# Patient Record
Sex: Female | Born: 1935 | Race: White | Hispanic: No | Smoking: Former smoker
Health system: Southern US, Community
[De-identification: ages and names within clinical notes are randomized; demographics above are authoritative.]

## PROBLEM LIST (undated history)

## (undated) DIAGNOSIS — E559 Vitamin D deficiency, unspecified: Secondary | ICD-10-CM

## (undated) DIAGNOSIS — M8580 Other specified disorders of bone density and structure, unspecified site: Secondary | ICD-10-CM

## (undated) DIAGNOSIS — E785 Hyperlipidemia, unspecified: Secondary | ICD-10-CM

## (undated) DIAGNOSIS — I8390 Asymptomatic varicose veins of unspecified lower extremity: Secondary | ICD-10-CM

## (undated) DIAGNOSIS — I10 Essential (primary) hypertension: Secondary | ICD-10-CM

---

## 1988-06-01 HISTORY — PX: ABDOMINAL HYSTERECTOMY: SHX81

## 2013-01-06 ENCOUNTER — Other Ambulatory Visit: Payer: Self-pay | Admitting: Internal Medicine

## 2013-01-06 DIAGNOSIS — I872 Venous insufficiency (chronic) (peripheral): Secondary | ICD-10-CM

## 2013-01-11 ENCOUNTER — Ambulatory Visit
Admission: RE | Admit: 2013-01-11 | Discharge: 2013-01-11 | Disposition: A | Discharge status: Home / Self Care | Payer: Medicare Other | Source: Ambulatory Visit | Attending: Internal Medicine | Admitting: Internal Medicine

## 2013-01-11 DIAGNOSIS — I872 Venous insufficiency (chronic) (peripheral): Secondary | ICD-10-CM

## 2013-01-11 HISTORY — DX: Essential (primary) hypertension: I10

## 2013-01-11 HISTORY — DX: Vitamin D deficiency, unspecified: E55.9

## 2013-01-11 HISTORY — DX: Hyperlipidemia, unspecified: E78.5

## 2013-01-11 HISTORY — DX: Other specified disorders of bone density and structure, unspecified site: M85.80

## 2013-01-11 HISTORY — DX: Asymptomatic varicose veins of unspecified lower extremity: I83.90

## 2013-06-21 ENCOUNTER — Telehealth: Payer: Self-pay | Admitting: Radiology

## 2013-06-21 NOTE — Telephone Encounter (Signed)
Patient plans to schedule f/u app't with Dr Delena Bali before making a decision to follow up here for re-evaluation and/or possible Rx.  States that she will contact our office should she decide to follow up with Dr Annamaria Boots.  Alexandra Kim [name], RN 06/21/2013 11:20 AM

## 2013-06-26 ENCOUNTER — Other Ambulatory Visit (HOSPITAL_COMMUNITY): Payer: Self-pay | Admitting: Interventional Radiology

## 2013-06-26 DIAGNOSIS — I872 Venous insufficiency (chronic) (peripheral): Secondary | ICD-10-CM

## 2013-07-25 ENCOUNTER — Inpatient Hospital Stay: Admission: RE | Admit: 2013-07-25 | Payer: Medicare Other | Source: Ambulatory Visit

## 2013-08-23 ENCOUNTER — Ambulatory Visit
Admission: RE | Admit: 2013-08-23 | Discharge: 2013-08-23 | Disposition: A | Discharge status: Home / Self Care | Payer: Medicare Other | Source: Ambulatory Visit | Attending: Interventional Radiology | Admitting: Interventional Radiology

## 2013-08-23 DIAGNOSIS — I872 Venous insufficiency (chronic) (peripheral): Secondary | ICD-10-CM

## 2013-08-23 NOTE — Progress Notes (Signed)
Daily use of graduated compression garment, 20-30 mm Hg for pain & edema, LLE> RLE.  Skin color changes of feet and ankles (sock pattern)  Restless leg syndrome affecting sleep.    LLE edema interferes with ADL's requiring frequent rest intervals & elevation.  Prefers not to take medications however occasionally takes Ibuprofen.   Aalaysia Liggins George, RN 08/23/2013 3:24 PM

## 2013-09-12 ENCOUNTER — Other Ambulatory Visit (HOSPITAL_COMMUNITY): Payer: Self-pay | Admitting: Interventional Radiology

## 2013-09-12 DIAGNOSIS — I83812 Varicose veins of left lower extremities with pain: Secondary | ICD-10-CM

## 2013-09-27 ENCOUNTER — Other Ambulatory Visit (HOSPITAL_COMMUNITY): Payer: Self-pay | Admitting: Interventional Radiology

## 2013-09-27 ENCOUNTER — Encounter (INDEPENDENT_AMBULATORY_CARE_PROVIDER_SITE_OTHER): Payer: Self-pay

## 2013-09-27 ENCOUNTER — Ambulatory Visit
Admission: RE | Admit: 2013-09-27 | Discharge: 2013-09-27 | Disposition: A | Discharge status: Home / Self Care | Payer: Medicare Other | Source: Ambulatory Visit | Attending: Interventional Radiology | Admitting: Interventional Radiology

## 2013-09-27 VITALS — BP 134/72 | HR 71 | Temp 97.4°F | Resp 17

## 2013-09-27 DIAGNOSIS — I83812 Varicose veins of left lower extremities with pain: Secondary | ICD-10-CM

## 2013-09-27 NOTE — Progress Notes (Signed)
0930  Informed Consent obtained.  Patient given verbal and written discharge instructions and states that she understands.  0945  22 gauge x 1" insyte catheter started IV Right antecubital (x 1 attempt).  Flushed with 10 ml NSS without difficulty.  Site unremarkable.  1015  Procedure Started.  1130  Procedure ended.  Patient tolerated well.  IV dc'd, catheter intact. Site unremarkable.  Wearing thigh high graduated compression garment (20-30 mm Hg) on LLE.  1140  Ambulated x 10 minutes without assistance, gait steady.  Tolerated well  1150 Discharged to home.  Granddaughter available to drive.    Yesika Rispoli [name], RN 09/27/2013 12:03 PM

## 2013-10-04 ENCOUNTER — Ambulatory Visit
Admission: RE | Admit: 2013-10-04 | Discharge: 2013-10-04 | Disposition: A | Discharge status: Home / Self Care | Payer: Medicare Other | Source: Ambulatory Visit | Attending: Interventional Radiology | Admitting: Interventional Radiology

## 2013-10-04 DIAGNOSIS — I83812 Varicose veins of left lower extremities with pain: Secondary | ICD-10-CM

## 2013-10-26 ENCOUNTER — Ambulatory Visit
Admission: RE | Admit: 2013-10-26 | Discharge: 2013-10-26 | Disposition: A | Discharge status: Home / Self Care | Payer: Medicare Other | Source: Ambulatory Visit | Attending: Interventional Radiology | Admitting: Interventional Radiology

## 2013-10-26 DIAGNOSIS — I83812 Varicose veins of left lower extremities with pain: Secondary | ICD-10-CM

## 2014-03-05 ENCOUNTER — Other Ambulatory Visit (HOSPITAL_COMMUNITY): Payer: Self-pay | Admitting: Interventional Radiology

## 2014-03-05 DIAGNOSIS — I83812 Varicose veins of left lower extremities with pain: Secondary | ICD-10-CM

## 2014-03-27 ENCOUNTER — Ambulatory Visit
Admission: RE | Admit: 2014-03-27 | Discharge: 2014-03-27 | Disposition: A | Discharge status: Home / Self Care | Payer: Medicare Other | Source: Ambulatory Visit | Attending: Interventional Radiology | Admitting: Interventional Radiology

## 2014-03-27 ENCOUNTER — Ambulatory Visit: Admission: RE | Admit: 2014-03-27 | Payer: Medicare Other | Source: Ambulatory Visit

## 2014-03-27 DIAGNOSIS — I83812 Varicose veins of left lower extremities with pain: Secondary | ICD-10-CM

## 2014-03-27 NOTE — Progress Notes (Signed)
Patient ID: Alexandra Kim, female   DOB: 01-Sep-1934, 79 y.o.   MRN: [id number]   Chief Complaint:  6 month status post Left GSV transcatheter laser occlusion. Venous insufficiency varicose veins, leg pain  Referring Physician(s): Dr. Delena Bali  History of Present Illness: Alexandra Kim is a 79 y.o. female who is a 6 month status post left GSV transcatheter laser occlusion for venous insufficiency, leg pain and varicose veins. She has recovered over the last 6 months from the procedure very well. She has been compliant with compression stockings and daily walking. No recurrent varicosities. However, today, she complains of diffuse dull left lower extremity pain. This is new over the last week. This appears to be different than her venous insufficiency related leg pain. No interval injury, fall, or trauma. No associated back or definite hip pain.  Past Medical History  Diagnosis Date  . Hypertension   . Osteopenia   . Hyperlipidemia   . Vitamin D deficiency   . Varicose veins     Past Surgical History  Procedure Laterality Date  . Abdominal hysterectomy  1990    Allergies: Review of patient's allergies indicates no known allergies.  Medications: Prior to Admission medications   Medication Sig Start Date End Date Taking? Authorizing Provider  aspirin 81 MG tablet Take 81 mg by mouth daily.    Historical Provider, MD  calcium carbonate (OS-CAL) 600 MG TABS tablet Take 600 mg by mouth 2 (two) times daily with a meal.    Historical Provider, MD  cholecalciferol (VITAMIN D) 1000 UNITS tablet Take 1,000 Units by mouth daily.    Historical Provider, MD  losartan-hydrochlorothiazide (HYZAAR) 100-25 MG per tablet Take 1 tablet by mouth daily.    Historical Provider, MD  Multiple Vitamin (MULTIVITAMIN) tablet Take 1 tablet by mouth daily.    Historical Provider, MD    No family history on file.  History   Social History  . Marital Status: Widowed    Spouse Name: N/A    Number of  Children: N/A  . Years of Education: N/A   Social History Main Topics  . Smoking status: Former Smoker    Quit date: 01/13/2007  . Smokeless tobacco: Never Used  . Alcohol Use: No  . Drug Use: Not on file  . Sexual Activity: Not on file   Other Topics Concern  . Not on file   Social History Narrative  . No narrative on file    Review of Systems: A 12 point ROS discussed and pertinent positives are indicated in the HPI above.  All other systems are negative.  Review of Systems  Constitutional: Negative for fever, activity change and appetite change.  Respiratory: Negative for cough and chest tightness.   Cardiovascular: Negative for chest pain.  Gastrointestinal: Negative for abdominal distention.    Physical Exam  Constitutional: She appears well-developed and well-nourished. No distress.  Musculoskeletal: Normal range of motion. She exhibits no edema and no tenderness.  No significant lower extremity edema or asymmetry. Normal symmetric pedal pulses. Skin intact. No recurrent varicosities. Stable minor scattered spider veins.  Skin: She is not diaphoretic.    Imaging: US Venous Img Lower Unilateral Left  03/27/2014   CLINICAL DATA:  Six-month status post left GSV transcatheter laser occlusion for venous insufficiency, leg pain and varicose veins  EXAM: LEFT LOWER EXTREMITY VENOUS DOPPLER ULTRASOUND  TECHNIQUE: Gray-scale sonography with graded compression, as well as color Doppler and duplex ultrasound were performed to evaluate the lower extremity deep venous  systems from the level of the common femoral vein and including the common femoral, femoral, profunda femoral, popliteal and calf veins including the posterior tibial, peroneal and gastrocnemius veins when visible. The superficial great saphenous vein was also interrogated. Spectral Doppler was utilized to evaluate flow at rest and with distal augmentation maneuvers in the common femoral, femoral and popliteal veins.   COMPARISON:  10/26/2013  FINDINGS: Left GSV treated segment remains occluded from just inferior to the saphenous femoral junction into the proximal calf. There is further retraction and scarring of the treated GSV segment. No significant recanalization or recurrent sub surface varicosities. Left common femoral, femoral, and popliteal veins demonstrate normal compressibility, phasicity and augmentation. No deep venous reflux. No DVT. Overall stable venous exam.  IMPRESSION: Left GSV treated segment remains occluded at 6 months. Negative for significant DVT are recurrent varicosities.   Electronically Signed   By: Daryll Brod M.D.   On: 03/27/2014 14:36   Assessment and Plan:  6 month status post left GSV transcatheter laser occlusion for venous deficiency, varicose veins, and leg pain. No significant recurrent varicosities or venous abnormality.  Recommend continued intermittent compression stockings, and daily walking. Followup as needed for any recurrent varicosities.  Nonspecific subacute left lower extremity diffuse pain appears to be unrelated to venous disease. I suspect this is either neurologic or musculoskeletal. She was encouraged to followup with her primary care physician for further recommendations and evaluation.    I spent a total of 20 minutes face to face in clinical consultation, greater than 50% of which was counseling/coordinating care for the patient.  SignedGreggory Keen T. 03/27/2014, 2:49 PM

## 2014-06-04 DIAGNOSIS — I743 Embolism and thrombosis of arteries of the lower extremities: Secondary | ICD-10-CM | POA: Diagnosis not present

## 2014-06-04 DIAGNOSIS — I739 Peripheral vascular disease, unspecified: Secondary | ICD-10-CM | POA: Diagnosis not present

## 2014-06-13 DIAGNOSIS — M79605 Pain in left leg: Secondary | ICD-10-CM | POA: Diagnosis not present

## 2014-07-25 DIAGNOSIS — H539 Unspecified visual disturbance: Secondary | ICD-10-CM | POA: Diagnosis not present

## 2014-10-08 DIAGNOSIS — Z6833 Body mass index (BMI) 33.0-33.9, adult: Secondary | ICD-10-CM | POA: Diagnosis not present

## 2014-10-08 DIAGNOSIS — I10 Essential (primary) hypertension: Secondary | ICD-10-CM | POA: Diagnosis not present

## 2014-10-16 DIAGNOSIS — Z6833 Body mass index (BMI) 33.0-33.9, adult: Secondary | ICD-10-CM | POA: Diagnosis not present

## 2014-10-16 DIAGNOSIS — I10 Essential (primary) hypertension: Secondary | ICD-10-CM | POA: Diagnosis not present

## 2014-10-16 DIAGNOSIS — R42 Dizziness and giddiness: Secondary | ICD-10-CM | POA: Diagnosis not present

## 2014-11-30 DIAGNOSIS — I10 Essential (primary) hypertension: Secondary | ICD-10-CM | POA: Diagnosis not present

## 2014-11-30 DIAGNOSIS — E559 Vitamin D deficiency, unspecified: Secondary | ICD-10-CM | POA: Diagnosis not present

## 2014-11-30 DIAGNOSIS — Z1389 Encounter for screening for other disorder: Secondary | ICD-10-CM | POA: Diagnosis not present

## 2014-11-30 DIAGNOSIS — E785 Hyperlipidemia, unspecified: Secondary | ICD-10-CM | POA: Diagnosis not present

## 2014-11-30 DIAGNOSIS — Z139 Encounter for screening, unspecified: Secondary | ICD-10-CM | POA: Diagnosis not present

## 2014-11-30 DIAGNOSIS — I872 Venous insufficiency (chronic) (peripheral): Secondary | ICD-10-CM | POA: Diagnosis not present

## 2014-11-30 DIAGNOSIS — Z9181 History of falling: Secondary | ICD-10-CM | POA: Diagnosis not present

## 2015-02-14 DIAGNOSIS — N390 Urinary tract infection, site not specified: Secondary | ICD-10-CM | POA: Diagnosis not present

## 2015-06-05 DIAGNOSIS — I872 Venous insufficiency (chronic) (peripheral): Secondary | ICD-10-CM | POA: Diagnosis not present

## 2015-06-05 DIAGNOSIS — I739 Peripheral vascular disease, unspecified: Secondary | ICD-10-CM | POA: Diagnosis not present

## 2015-06-05 DIAGNOSIS — E785 Hyperlipidemia, unspecified: Secondary | ICD-10-CM | POA: Diagnosis not present

## 2015-06-05 DIAGNOSIS — I10 Essential (primary) hypertension: Secondary | ICD-10-CM | POA: Diagnosis not present

## 2015-06-05 DIAGNOSIS — E559 Vitamin D deficiency, unspecified: Secondary | ICD-10-CM | POA: Diagnosis not present

## 2015-07-15 DIAGNOSIS — B351 Tinea unguium: Secondary | ICD-10-CM | POA: Diagnosis not present

## 2015-07-15 DIAGNOSIS — Z79899 Other long term (current) drug therapy: Secondary | ICD-10-CM | POA: Diagnosis not present

## 2015-08-12 DIAGNOSIS — Z79899 Other long term (current) drug therapy: Secondary | ICD-10-CM | POA: Diagnosis not present

## 2015-09-12 DIAGNOSIS — Z79899 Other long term (current) drug therapy: Secondary | ICD-10-CM | POA: Diagnosis not present

## 2015-10-29 DIAGNOSIS — I872 Venous insufficiency (chronic) (peripheral): Secondary | ICD-10-CM | POA: Diagnosis not present

## 2015-10-29 DIAGNOSIS — M79605 Pain in left leg: Secondary | ICD-10-CM | POA: Diagnosis not present

## 2015-12-04 DIAGNOSIS — I10 Essential (primary) hypertension: Secondary | ICD-10-CM | POA: Diagnosis not present

## 2015-12-04 DIAGNOSIS — E559 Vitamin D deficiency, unspecified: Secondary | ICD-10-CM | POA: Diagnosis not present

## 2015-12-04 DIAGNOSIS — Z1389 Encounter for screening for other disorder: Secondary | ICD-10-CM | POA: Diagnosis not present

## 2015-12-04 DIAGNOSIS — I739 Peripheral vascular disease, unspecified: Secondary | ICD-10-CM | POA: Diagnosis not present

## 2015-12-04 DIAGNOSIS — E785 Hyperlipidemia, unspecified: Secondary | ICD-10-CM | POA: Diagnosis not present

## 2016-01-10 DIAGNOSIS — M79605 Pain in left leg: Secondary | ICD-10-CM | POA: Diagnosis not present

## 2016-01-10 DIAGNOSIS — M7989 Other specified soft tissue disorders: Secondary | ICD-10-CM | POA: Diagnosis not present

## 2016-01-10 DIAGNOSIS — I872 Venous insufficiency (chronic) (peripheral): Secondary | ICD-10-CM | POA: Diagnosis not present

## 2016-01-27 DIAGNOSIS — I872 Venous insufficiency (chronic) (peripheral): Secondary | ICD-10-CM | POA: Diagnosis not present

## 2016-01-27 DIAGNOSIS — I8392 Asymptomatic varicose veins of left lower extremity: Secondary | ICD-10-CM | POA: Diagnosis not present

## 2016-01-27 DIAGNOSIS — I83812 Varicose veins of left lower extremities with pain: Secondary | ICD-10-CM | POA: Diagnosis not present

## 2016-01-27 DIAGNOSIS — I83892 Varicose veins of left lower extremities with other complications: Secondary | ICD-10-CM | POA: Diagnosis not present

## 2016-02-10 DIAGNOSIS — Z09 Encounter for follow-up examination after completed treatment for conditions other than malignant neoplasm: Secondary | ICD-10-CM | POA: Diagnosis not present

## 2016-02-10 DIAGNOSIS — I872 Venous insufficiency (chronic) (peripheral): Secondary | ICD-10-CM | POA: Diagnosis not present

## 2016-02-10 DIAGNOSIS — I83892 Varicose veins of left lower extremities with other complications: Secondary | ICD-10-CM | POA: Diagnosis not present

## 2016-06-10 DIAGNOSIS — I10 Essential (primary) hypertension: Secondary | ICD-10-CM | POA: Diagnosis not present

## 2016-06-10 DIAGNOSIS — E785 Hyperlipidemia, unspecified: Secondary | ICD-10-CM | POA: Diagnosis not present

## 2016-06-10 DIAGNOSIS — E559 Vitamin D deficiency, unspecified: Secondary | ICD-10-CM | POA: Diagnosis not present

## 2016-06-10 DIAGNOSIS — K219 Gastro-esophageal reflux disease without esophagitis: Secondary | ICD-10-CM | POA: Diagnosis not present

## 2016-06-10 DIAGNOSIS — I739 Peripheral vascular disease, unspecified: Secondary | ICD-10-CM | POA: Diagnosis not present

## 2016-10-09 DIAGNOSIS — S52121A Displaced fracture of head of right radius, initial encounter for closed fracture: Secondary | ICD-10-CM | POA: Diagnosis not present

## 2016-10-09 DIAGNOSIS — M25531 Pain in right wrist: Secondary | ICD-10-CM | POA: Diagnosis not present

## 2016-10-16 DIAGNOSIS — S52121A Displaced fracture of head of right radius, initial encounter for closed fracture: Secondary | ICD-10-CM | POA: Diagnosis not present

## 2016-10-16 DIAGNOSIS — S63502A Unspecified sprain of left wrist, initial encounter: Secondary | ICD-10-CM | POA: Diagnosis not present

## 2016-10-21 DIAGNOSIS — S52121A Displaced fracture of head of right radius, initial encounter for closed fracture: Secondary | ICD-10-CM | POA: Diagnosis not present

## 2016-10-21 DIAGNOSIS — M25521 Pain in right elbow: Secondary | ICD-10-CM | POA: Diagnosis not present

## 2016-10-21 DIAGNOSIS — S62131A Displaced fracture of capitate [os magnum] bone, right wrist, initial encounter for closed fracture: Secondary | ICD-10-CM | POA: Diagnosis not present

## 2016-11-20 DIAGNOSIS — S52121A Displaced fracture of head of right radius, initial encounter for closed fracture: Secondary | ICD-10-CM | POA: Diagnosis not present

## 2016-11-27 DIAGNOSIS — S52121A Displaced fracture of head of right radius, initial encounter for closed fracture: Secondary | ICD-10-CM | POA: Diagnosis not present

## 2016-11-30 DIAGNOSIS — S52121A Displaced fracture of head of right radius, initial encounter for closed fracture: Secondary | ICD-10-CM | POA: Insufficient documentation

## 2016-11-30 HISTORY — DX: Displaced fracture of head of right radius, initial encounter for closed fracture: S52.121A

## 2016-12-08 DIAGNOSIS — E559 Vitamin D deficiency, unspecified: Secondary | ICD-10-CM | POA: Diagnosis not present

## 2016-12-08 DIAGNOSIS — Z9181 History of falling: Secondary | ICD-10-CM | POA: Diagnosis not present

## 2016-12-08 DIAGNOSIS — Z139 Encounter for screening, unspecified: Secondary | ICD-10-CM | POA: Diagnosis not present

## 2016-12-08 DIAGNOSIS — I10 Essential (primary) hypertension: Secondary | ICD-10-CM | POA: Diagnosis not present

## 2016-12-08 DIAGNOSIS — I739 Peripheral vascular disease, unspecified: Secondary | ICD-10-CM | POA: Diagnosis not present

## 2016-12-08 DIAGNOSIS — Z23 Encounter for immunization: Secondary | ICD-10-CM | POA: Diagnosis not present

## 2016-12-08 DIAGNOSIS — Z1389 Encounter for screening for other disorder: Secondary | ICD-10-CM | POA: Diagnosis not present

## 2016-12-08 DIAGNOSIS — E785 Hyperlipidemia, unspecified: Secondary | ICD-10-CM | POA: Diagnosis not present

## 2016-12-09 DIAGNOSIS — M25521 Pain in right elbow: Secondary | ICD-10-CM | POA: Diagnosis not present

## 2016-12-09 DIAGNOSIS — M25621 Stiffness of right elbow, not elsewhere classified: Secondary | ICD-10-CM | POA: Diagnosis not present

## 2016-12-09 DIAGNOSIS — M6281 Muscle weakness (generalized): Secondary | ICD-10-CM | POA: Diagnosis not present

## 2016-12-09 DIAGNOSIS — S52121A Displaced fracture of head of right radius, initial encounter for closed fracture: Secondary | ICD-10-CM | POA: Diagnosis not present

## 2016-12-10 DIAGNOSIS — M25621 Stiffness of right elbow, not elsewhere classified: Secondary | ICD-10-CM | POA: Diagnosis not present

## 2016-12-10 DIAGNOSIS — M25521 Pain in right elbow: Secondary | ICD-10-CM | POA: Diagnosis not present

## 2016-12-10 DIAGNOSIS — S52121A Displaced fracture of head of right radius, initial encounter for closed fracture: Secondary | ICD-10-CM | POA: Diagnosis not present

## 2016-12-10 DIAGNOSIS — M6281 Muscle weakness (generalized): Secondary | ICD-10-CM | POA: Diagnosis not present

## 2016-12-14 DIAGNOSIS — M25521 Pain in right elbow: Secondary | ICD-10-CM | POA: Diagnosis not present

## 2016-12-14 DIAGNOSIS — S52121A Displaced fracture of head of right radius, initial encounter for closed fracture: Secondary | ICD-10-CM | POA: Diagnosis not present

## 2016-12-14 DIAGNOSIS — M25621 Stiffness of right elbow, not elsewhere classified: Secondary | ICD-10-CM | POA: Diagnosis not present

## 2016-12-14 DIAGNOSIS — M6281 Muscle weakness (generalized): Secondary | ICD-10-CM | POA: Diagnosis not present

## 2016-12-16 DIAGNOSIS — M25521 Pain in right elbow: Secondary | ICD-10-CM | POA: Diagnosis not present

## 2016-12-16 DIAGNOSIS — M25621 Stiffness of right elbow, not elsewhere classified: Secondary | ICD-10-CM | POA: Diagnosis not present

## 2016-12-16 DIAGNOSIS — M6281 Muscle weakness (generalized): Secondary | ICD-10-CM | POA: Diagnosis not present

## 2016-12-16 DIAGNOSIS — S52121A Displaced fracture of head of right radius, initial encounter for closed fracture: Secondary | ICD-10-CM | POA: Diagnosis not present

## 2016-12-18 DIAGNOSIS — S52121A Displaced fracture of head of right radius, initial encounter for closed fracture: Secondary | ICD-10-CM | POA: Diagnosis not present

## 2016-12-18 DIAGNOSIS — M6281 Muscle weakness (generalized): Secondary | ICD-10-CM | POA: Diagnosis not present

## 2016-12-18 DIAGNOSIS — M25521 Pain in right elbow: Secondary | ICD-10-CM | POA: Diagnosis not present

## 2016-12-18 DIAGNOSIS — M25621 Stiffness of right elbow, not elsewhere classified: Secondary | ICD-10-CM | POA: Diagnosis not present

## 2016-12-30 DIAGNOSIS — M6281 Muscle weakness (generalized): Secondary | ICD-10-CM | POA: Diagnosis not present

## 2016-12-30 DIAGNOSIS — S52121A Displaced fracture of head of right radius, initial encounter for closed fracture: Secondary | ICD-10-CM | POA: Diagnosis not present

## 2016-12-30 DIAGNOSIS — M25521 Pain in right elbow: Secondary | ICD-10-CM | POA: Diagnosis not present

## 2016-12-30 DIAGNOSIS — M25621 Stiffness of right elbow, not elsewhere classified: Secondary | ICD-10-CM | POA: Diagnosis not present

## 2017-01-01 DIAGNOSIS — M25621 Stiffness of right elbow, not elsewhere classified: Secondary | ICD-10-CM | POA: Diagnosis not present

## 2017-01-01 DIAGNOSIS — M6281 Muscle weakness (generalized): Secondary | ICD-10-CM | POA: Diagnosis not present

## 2017-01-01 DIAGNOSIS — S52121A Displaced fracture of head of right radius, initial encounter for closed fracture: Secondary | ICD-10-CM | POA: Diagnosis not present

## 2017-01-01 DIAGNOSIS — M25521 Pain in right elbow: Secondary | ICD-10-CM | POA: Diagnosis not present

## 2017-01-05 DIAGNOSIS — M6281 Muscle weakness (generalized): Secondary | ICD-10-CM | POA: Diagnosis not present

## 2017-01-05 DIAGNOSIS — M25621 Stiffness of right elbow, not elsewhere classified: Secondary | ICD-10-CM | POA: Diagnosis not present

## 2017-01-05 DIAGNOSIS — M25521 Pain in right elbow: Secondary | ICD-10-CM | POA: Diagnosis not present

## 2017-01-05 DIAGNOSIS — S52121A Displaced fracture of head of right radius, initial encounter for closed fracture: Secondary | ICD-10-CM | POA: Diagnosis not present

## 2017-05-17 DIAGNOSIS — J029 Acute pharyngitis, unspecified: Secondary | ICD-10-CM | POA: Diagnosis not present

## 2017-05-17 DIAGNOSIS — J019 Acute sinusitis, unspecified: Secondary | ICD-10-CM | POA: Diagnosis not present

## 2017-06-02 DIAGNOSIS — Z9181 History of falling: Secondary | ICD-10-CM | POA: Diagnosis not present

## 2017-06-02 DIAGNOSIS — Z0000 Encounter for general adult medical examination without abnormal findings: Secondary | ICD-10-CM | POA: Diagnosis not present

## 2017-06-02 DIAGNOSIS — Z136 Encounter for screening for cardiovascular disorders: Secondary | ICD-10-CM | POA: Diagnosis not present

## 2017-06-02 DIAGNOSIS — Z139 Encounter for screening, unspecified: Secondary | ICD-10-CM | POA: Diagnosis not present

## 2017-06-02 DIAGNOSIS — Z1331 Encounter for screening for depression: Secondary | ICD-10-CM | POA: Diagnosis not present

## 2017-06-02 DIAGNOSIS — E785 Hyperlipidemia, unspecified: Secondary | ICD-10-CM | POA: Diagnosis not present

## 2017-06-07 DIAGNOSIS — M792 Neuralgia and neuritis, unspecified: Secondary | ICD-10-CM | POA: Diagnosis not present

## 2017-06-07 DIAGNOSIS — R109 Unspecified abdominal pain: Secondary | ICD-10-CM | POA: Diagnosis not present

## 2017-06-07 DIAGNOSIS — R110 Nausea: Secondary | ICD-10-CM | POA: Diagnosis not present

## 2017-06-07 DIAGNOSIS — N390 Urinary tract infection, site not specified: Secondary | ICD-10-CM | POA: Diagnosis not present

## 2017-06-15 DIAGNOSIS — E785 Hyperlipidemia, unspecified: Secondary | ICD-10-CM | POA: Diagnosis not present

## 2017-06-15 DIAGNOSIS — I10 Essential (primary) hypertension: Secondary | ICD-10-CM | POA: Diagnosis not present

## 2017-06-15 DIAGNOSIS — E559 Vitamin D deficiency, unspecified: Secondary | ICD-10-CM | POA: Diagnosis not present

## 2017-06-15 DIAGNOSIS — I739 Peripheral vascular disease, unspecified: Secondary | ICD-10-CM | POA: Diagnosis not present

## 2017-07-05 DIAGNOSIS — H359 Unspecified retinal disorder: Secondary | ICD-10-CM | POA: Diagnosis not present

## 2017-07-05 DIAGNOSIS — H35372 Puckering of macula, left eye: Secondary | ICD-10-CM | POA: Diagnosis not present

## 2017-07-15 DIAGNOSIS — H353132 Nonexudative age-related macular degeneration, bilateral, intermediate dry stage: Secondary | ICD-10-CM | POA: Diagnosis not present

## 2017-07-15 DIAGNOSIS — H35372 Puckering of macula, left eye: Secondary | ICD-10-CM | POA: Diagnosis not present

## 2017-11-02 DIAGNOSIS — L719 Rosacea, unspecified: Secondary | ICD-10-CM | POA: Diagnosis not present

## 2017-11-02 DIAGNOSIS — L570 Actinic keratosis: Secondary | ICD-10-CM | POA: Diagnosis not present

## 2017-12-13 DIAGNOSIS — E559 Vitamin D deficiency, unspecified: Secondary | ICD-10-CM | POA: Diagnosis not present

## 2017-12-13 DIAGNOSIS — Z1339 Encounter for screening examination for other mental health and behavioral disorders: Secondary | ICD-10-CM | POA: Diagnosis not present

## 2017-12-13 DIAGNOSIS — I10 Essential (primary) hypertension: Secondary | ICD-10-CM | POA: Diagnosis not present

## 2017-12-13 DIAGNOSIS — E785 Hyperlipidemia, unspecified: Secondary | ICD-10-CM | POA: Diagnosis not present

## 2017-12-13 DIAGNOSIS — I739 Peripheral vascular disease, unspecified: Secondary | ICD-10-CM | POA: Diagnosis not present

## 2017-12-22 DIAGNOSIS — H524 Presbyopia: Secondary | ICD-10-CM | POA: Diagnosis not present

## 2017-12-22 DIAGNOSIS — H353132 Nonexudative age-related macular degeneration, bilateral, intermediate dry stage: Secondary | ICD-10-CM | POA: Diagnosis not present

## 2018-03-29 DIAGNOSIS — I10 Essential (primary) hypertension: Secondary | ICD-10-CM | POA: Diagnosis not present

## 2018-03-29 DIAGNOSIS — H8113 Benign paroxysmal vertigo, bilateral: Secondary | ICD-10-CM | POA: Diagnosis not present

## 2018-03-30 DIAGNOSIS — H353221 Exudative age-related macular degeneration, left eye, with active choroidal neovascularization: Secondary | ICD-10-CM | POA: Diagnosis not present

## 2018-04-26 DIAGNOSIS — E559 Vitamin D deficiency, unspecified: Secondary | ICD-10-CM | POA: Diagnosis not present

## 2018-04-26 DIAGNOSIS — K219 Gastro-esophageal reflux disease without esophagitis: Secondary | ICD-10-CM | POA: Diagnosis not present

## 2018-04-26 DIAGNOSIS — I10 Essential (primary) hypertension: Secondary | ICD-10-CM | POA: Diagnosis not present

## 2018-04-26 DIAGNOSIS — E785 Hyperlipidemia, unspecified: Secondary | ICD-10-CM | POA: Diagnosis not present

## 2018-04-26 DIAGNOSIS — I739 Peripheral vascular disease, unspecified: Secondary | ICD-10-CM | POA: Diagnosis not present

## 2018-06-02 DIAGNOSIS — H353112 Nonexudative age-related macular degeneration, right eye, intermediate dry stage: Secondary | ICD-10-CM | POA: Diagnosis not present

## 2018-06-02 DIAGNOSIS — H353221 Exudative age-related macular degeneration, left eye, with active choroidal neovascularization: Secondary | ICD-10-CM | POA: Diagnosis not present

## 2018-06-30 DIAGNOSIS — H353221 Exudative age-related macular degeneration, left eye, with active choroidal neovascularization: Secondary | ICD-10-CM | POA: Diagnosis not present

## 2018-06-30 DIAGNOSIS — H0015 Chalazion left lower eyelid: Secondary | ICD-10-CM | POA: Diagnosis not present

## 2018-07-07 DIAGNOSIS — H353221 Exudative age-related macular degeneration, left eye, with active choroidal neovascularization: Secondary | ICD-10-CM | POA: Diagnosis not present

## 2018-07-16 DIAGNOSIS — I10 Essential (primary) hypertension: Secondary | ICD-10-CM | POA: Diagnosis not present

## 2018-07-16 DIAGNOSIS — J208 Acute bronchitis due to other specified organisms: Secondary | ICD-10-CM | POA: Diagnosis not present

## 2018-07-16 DIAGNOSIS — J019 Acute sinusitis, unspecified: Secondary | ICD-10-CM | POA: Diagnosis not present

## 2018-07-16 DIAGNOSIS — R6889 Other general symptoms and signs: Secondary | ICD-10-CM | POA: Diagnosis not present

## 2018-08-02 DIAGNOSIS — R531 Weakness: Secondary | ICD-10-CM | POA: Diagnosis not present

## 2018-08-02 DIAGNOSIS — R079 Chest pain, unspecified: Secondary | ICD-10-CM | POA: Diagnosis not present

## 2018-08-02 DIAGNOSIS — R0789 Other chest pain: Secondary | ICD-10-CM | POA: Diagnosis not present

## 2018-08-02 DIAGNOSIS — I10 Essential (primary) hypertension: Secondary | ICD-10-CM | POA: Diagnosis not present

## 2018-08-02 DIAGNOSIS — E860 Dehydration: Secondary | ICD-10-CM | POA: Diagnosis not present

## 2018-08-03 DIAGNOSIS — E860 Dehydration: Secondary | ICD-10-CM | POA: Diagnosis not present

## 2018-08-03 DIAGNOSIS — R079 Chest pain, unspecified: Secondary | ICD-10-CM | POA: Diagnosis not present

## 2018-08-08 DIAGNOSIS — H353221 Exudative age-related macular degeneration, left eye, with active choroidal neovascularization: Secondary | ICD-10-CM | POA: Diagnosis not present

## 2018-08-10 DIAGNOSIS — E860 Dehydration: Secondary | ICD-10-CM | POA: Diagnosis not present

## 2018-08-10 DIAGNOSIS — R079 Chest pain, unspecified: Secondary | ICD-10-CM | POA: Diagnosis not present

## 2018-08-10 DIAGNOSIS — K219 Gastro-esophageal reflux disease without esophagitis: Secondary | ICD-10-CM | POA: Diagnosis not present

## 2018-08-30 ENCOUNTER — Telehealth: Payer: Self-pay

## 2018-08-30 ENCOUNTER — Other Ambulatory Visit: Payer: Self-pay

## 2018-08-30 DIAGNOSIS — R079 Chest pain, unspecified: Secondary | ICD-10-CM

## 2018-08-30 DIAGNOSIS — K219 Gastro-esophageal reflux disease without esophagitis: Secondary | ICD-10-CM

## 2018-08-30 DIAGNOSIS — I10 Essential (primary) hypertension: Secondary | ICD-10-CM

## 2018-08-30 DIAGNOSIS — M8580 Other specified disorders of bone density and structure, unspecified site: Secondary | ICD-10-CM | POA: Insufficient documentation

## 2018-08-30 DIAGNOSIS — M5126 Other intervertebral disc displacement, lumbar region: Secondary | ICD-10-CM

## 2018-08-30 DIAGNOSIS — M5416 Radiculopathy, lumbar region: Secondary | ICD-10-CM

## 2018-08-30 DIAGNOSIS — E785 Hyperlipidemia, unspecified: Secondary | ICD-10-CM

## 2018-08-30 HISTORY — DX: Chest pain, unspecified: R07.9

## 2018-08-30 HISTORY — DX: Other intervertebral disc displacement, lumbar region: M51.26

## 2018-08-30 HISTORY — DX: Radiculopathy, lumbar region: M54.16

## 2018-08-30 HISTORY — DX: Gastro-esophageal reflux disease without esophagitis: K21.9

## 2018-08-30 HISTORY — DX: Essential (primary) hypertension: I10

## 2018-08-30 NOTE — Telephone Encounter (Signed)
Due to COVID 19 I called and asked patient would they like to do a telephone visit or webx  virtual visit and patient decline and stated would like to reschedule because she has no problems, issues or concerns and I informed patient we will call in 4 to 6 weeks for appointment.

## 2018-08-31 ENCOUNTER — Telehealth: Payer: Self-pay | Admitting: Cardiology

## 2018-08-31 NOTE — Telephone Encounter (Signed)
Per [place] patient declined Televisit and provider wanted to cancel/lbw

## 2018-09-07 ENCOUNTER — Ambulatory Visit: Payer: Self-pay | Admitting: Cardiology

## 2018-09-29 DIAGNOSIS — H353221 Exudative age-related macular degeneration, left eye, with active choroidal neovascularization: Secondary | ICD-10-CM | POA: Diagnosis not present

## 2018-09-29 DIAGNOSIS — H353112 Nonexudative age-related macular degeneration, right eye, intermediate dry stage: Secondary | ICD-10-CM | POA: Diagnosis not present

## 2018-11-01 DIAGNOSIS — Z139 Encounter for screening, unspecified: Secondary | ICD-10-CM | POA: Diagnosis not present

## 2018-11-01 DIAGNOSIS — I10 Essential (primary) hypertension: Secondary | ICD-10-CM | POA: Diagnosis not present

## 2018-11-01 DIAGNOSIS — E785 Hyperlipidemia, unspecified: Secondary | ICD-10-CM | POA: Diagnosis not present

## 2018-11-01 DIAGNOSIS — I739 Peripheral vascular disease, unspecified: Secondary | ICD-10-CM | POA: Diagnosis not present

## 2018-11-01 DIAGNOSIS — E559 Vitamin D deficiency, unspecified: Secondary | ICD-10-CM | POA: Diagnosis not present

## 2018-11-01 DIAGNOSIS — Z9181 History of falling: Secondary | ICD-10-CM | POA: Diagnosis not present

## 2018-11-04 DIAGNOSIS — H353221 Exudative age-related macular degeneration, left eye, with active choroidal neovascularization: Secondary | ICD-10-CM | POA: Diagnosis not present

## 2018-12-22 DIAGNOSIS — H353221 Exudative age-related macular degeneration, left eye, with active choroidal neovascularization: Secondary | ICD-10-CM | POA: Diagnosis not present

## 2019-02-08 DIAGNOSIS — Z9889 Other specified postprocedural states: Secondary | ICD-10-CM | POA: Diagnosis not present

## 2019-02-08 DIAGNOSIS — I872 Venous insufficiency (chronic) (peripheral): Secondary | ICD-10-CM | POA: Diagnosis not present

## 2019-02-13 DIAGNOSIS — I872 Venous insufficiency (chronic) (peripheral): Secondary | ICD-10-CM | POA: Insufficient documentation

## 2019-02-13 HISTORY — DX: Venous insufficiency (chronic) (peripheral): I87.2

## 2019-02-16 DIAGNOSIS — H353112 Nonexudative age-related macular degeneration, right eye, intermediate dry stage: Secondary | ICD-10-CM | POA: Diagnosis not present

## 2019-03-14 DIAGNOSIS — Z23 Encounter for immunization: Secondary | ICD-10-CM | POA: Diagnosis not present

## 2019-04-06 DIAGNOSIS — H353221 Exudative age-related macular degeneration, left eye, with active choroidal neovascularization: Secondary | ICD-10-CM | POA: Diagnosis not present

## 2019-04-18 DIAGNOSIS — J069 Acute upper respiratory infection, unspecified: Secondary | ICD-10-CM | POA: Diagnosis not present

## 2019-04-26 DIAGNOSIS — R0789 Other chest pain: Secondary | ICD-10-CM | POA: Diagnosis not present

## 2019-04-26 DIAGNOSIS — I9589 Other hypotension: Secondary | ICD-10-CM | POA: Diagnosis not present

## 2019-04-26 DIAGNOSIS — I10 Essential (primary) hypertension: Secondary | ICD-10-CM | POA: Diagnosis not present

## 2019-05-03 DIAGNOSIS — I10 Essential (primary) hypertension: Secondary | ICD-10-CM | POA: Diagnosis not present

## 2019-05-03 DIAGNOSIS — E559 Vitamin D deficiency, unspecified: Secondary | ICD-10-CM | POA: Diagnosis not present

## 2019-05-03 DIAGNOSIS — K219 Gastro-esophageal reflux disease without esophagitis: Secondary | ICD-10-CM | POA: Diagnosis not present

## 2019-05-03 DIAGNOSIS — E785 Hyperlipidemia, unspecified: Secondary | ICD-10-CM | POA: Diagnosis not present

## 2019-05-03 DIAGNOSIS — I739 Peripheral vascular disease, unspecified: Secondary | ICD-10-CM | POA: Diagnosis not present

## 2019-05-18 DIAGNOSIS — E785 Hyperlipidemia, unspecified: Secondary | ICD-10-CM | POA: Diagnosis not present

## 2019-05-18 DIAGNOSIS — Z9181 History of falling: Secondary | ICD-10-CM | POA: Diagnosis not present

## 2019-05-18 DIAGNOSIS — Z0000 Encounter for general adult medical examination without abnormal findings: Secondary | ICD-10-CM | POA: Diagnosis not present

## 2019-05-18 DIAGNOSIS — Z136 Encounter for screening for cardiovascular disorders: Secondary | ICD-10-CM | POA: Diagnosis not present

## 2019-06-08 DIAGNOSIS — H353221 Exudative age-related macular degeneration, left eye, with active choroidal neovascularization: Secondary | ICD-10-CM | POA: Diagnosis not present

## 2019-08-03 DIAGNOSIS — H353221 Exudative age-related macular degeneration, left eye, with active choroidal neovascularization: Secondary | ICD-10-CM | POA: Diagnosis not present

## 2019-09-18 DIAGNOSIS — L820 Inflamed seborrheic keratosis: Secondary | ICD-10-CM | POA: Diagnosis not present

## 2019-09-18 DIAGNOSIS — L570 Actinic keratosis: Secondary | ICD-10-CM | POA: Diagnosis not present

## 2019-09-28 DIAGNOSIS — H353221 Exudative age-related macular degeneration, left eye, with active choroidal neovascularization: Secondary | ICD-10-CM | POA: Diagnosis not present

## 2019-09-30 DIAGNOSIS — N390 Urinary tract infection, site not specified: Secondary | ICD-10-CM | POA: Diagnosis not present

## 2019-10-18 DIAGNOSIS — N390 Urinary tract infection, site not specified: Secondary | ICD-10-CM | POA: Diagnosis not present

## 2019-10-24 ENCOUNTER — Encounter: Payer: Self-pay | Admitting: Gastroenterology

## 2019-11-01 DIAGNOSIS — K219 Gastro-esophageal reflux disease without esophagitis: Secondary | ICD-10-CM | POA: Diagnosis not present

## 2019-11-01 DIAGNOSIS — I10 Essential (primary) hypertension: Secondary | ICD-10-CM | POA: Diagnosis not present

## 2019-11-01 DIAGNOSIS — E559 Vitamin D deficiency, unspecified: Secondary | ICD-10-CM | POA: Diagnosis not present

## 2019-11-01 DIAGNOSIS — I739 Peripheral vascular disease, unspecified: Secondary | ICD-10-CM | POA: Diagnosis not present

## 2019-11-01 DIAGNOSIS — E785 Hyperlipidemia, unspecified: Secondary | ICD-10-CM | POA: Diagnosis not present

## 2019-11-30 ENCOUNTER — Ambulatory Visit: Payer: Medicare Other | Admitting: Gastroenterology

## 2019-12-07 DIAGNOSIS — H353221 Exudative age-related macular degeneration, left eye, with active choroidal neovascularization: Secondary | ICD-10-CM | POA: Diagnosis not present

## 2019-12-28 DIAGNOSIS — J069 Acute upper respiratory infection, unspecified: Secondary | ICD-10-CM | POA: Diagnosis not present

## 2020-02-29 DIAGNOSIS — H353221 Exudative age-related macular degeneration, left eye, with active choroidal neovascularization: Secondary | ICD-10-CM | POA: Diagnosis not present

## 2020-05-01 DIAGNOSIS — B9689 Other specified bacterial agents as the cause of diseases classified elsewhere: Secondary | ICD-10-CM | POA: Diagnosis not present

## 2020-05-01 DIAGNOSIS — J208 Acute bronchitis due to other specified organisms: Secondary | ICD-10-CM | POA: Diagnosis not present

## 2020-05-01 DIAGNOSIS — J019 Acute sinusitis, unspecified: Secondary | ICD-10-CM | POA: Diagnosis not present

## 2020-05-14 DIAGNOSIS — E785 Hyperlipidemia, unspecified: Secondary | ICD-10-CM | POA: Diagnosis not present

## 2020-05-14 DIAGNOSIS — I10 Essential (primary) hypertension: Secondary | ICD-10-CM | POA: Diagnosis not present

## 2020-05-14 DIAGNOSIS — E559 Vitamin D deficiency, unspecified: Secondary | ICD-10-CM | POA: Diagnosis not present

## 2020-06-06 DIAGNOSIS — H353221 Exudative age-related macular degeneration, left eye, with active choroidal neovascularization: Secondary | ICD-10-CM | POA: Diagnosis not present

## 2020-06-12 DIAGNOSIS — M25561 Pain in right knee: Secondary | ICD-10-CM | POA: Diagnosis not present

## 2020-06-12 DIAGNOSIS — M1711 Unilateral primary osteoarthritis, right knee: Secondary | ICD-10-CM | POA: Diagnosis not present

## 2020-06-12 DIAGNOSIS — M79661 Pain in right lower leg: Secondary | ICD-10-CM | POA: Diagnosis not present

## 2020-06-12 DIAGNOSIS — M79604 Pain in right leg: Secondary | ICD-10-CM | POA: Diagnosis not present

## 2020-07-15 DIAGNOSIS — Z0000 Encounter for general adult medical examination without abnormal findings: Secondary | ICD-10-CM | POA: Diagnosis not present

## 2020-07-15 DIAGNOSIS — Z9181 History of falling: Secondary | ICD-10-CM | POA: Diagnosis not present

## 2020-07-15 DIAGNOSIS — Z139 Encounter for screening, unspecified: Secondary | ICD-10-CM | POA: Diagnosis not present

## 2020-08-24 DIAGNOSIS — I872 Venous insufficiency (chronic) (peripheral): Secondary | ICD-10-CM | POA: Diagnosis not present

## 2020-08-24 DIAGNOSIS — K580 Irritable bowel syndrome with diarrhea: Secondary | ICD-10-CM | POA: Diagnosis not present

## 2020-08-24 DIAGNOSIS — R350 Frequency of micturition: Secondary | ICD-10-CM | POA: Diagnosis not present

## 2020-08-27 DIAGNOSIS — D0439 Carcinoma in situ of skin of other parts of face: Secondary | ICD-10-CM | POA: Diagnosis not present

## 2020-08-27 DIAGNOSIS — L578 Other skin changes due to chronic exposure to nonionizing radiation: Secondary | ICD-10-CM | POA: Diagnosis not present

## 2020-08-27 DIAGNOSIS — I781 Nevus, non-neoplastic: Secondary | ICD-10-CM | POA: Diagnosis not present

## 2020-08-27 DIAGNOSIS — L570 Actinic keratosis: Secondary | ICD-10-CM | POA: Diagnosis not present

## 2020-09-16 DIAGNOSIS — R1310 Dysphagia, unspecified: Secondary | ICD-10-CM | POA: Diagnosis not present

## 2020-09-26 DIAGNOSIS — H353112 Nonexudative age-related macular degeneration, right eye, intermediate dry stage: Secondary | ICD-10-CM | POA: Diagnosis not present

## 2020-09-26 DIAGNOSIS — H353221 Exudative age-related macular degeneration, left eye, with active choroidal neovascularization: Secondary | ICD-10-CM | POA: Diagnosis not present

## 2020-10-03 DIAGNOSIS — K449 Diaphragmatic hernia without obstruction or gangrene: Secondary | ICD-10-CM | POA: Diagnosis not present

## 2020-10-03 DIAGNOSIS — R1310 Dysphagia, unspecified: Secondary | ICD-10-CM | POA: Diagnosis not present

## 2020-10-03 DIAGNOSIS — K222 Esophageal obstruction: Secondary | ICD-10-CM | POA: Diagnosis not present

## 2020-10-03 DIAGNOSIS — K469 Unspecified abdominal hernia without obstruction or gangrene: Secondary | ICD-10-CM | POA: Diagnosis not present

## 2020-10-07 DIAGNOSIS — I10 Essential (primary) hypertension: Secondary | ICD-10-CM | POA: Diagnosis not present

## 2020-10-31 DIAGNOSIS — I739 Peripheral vascular disease, unspecified: Secondary | ICD-10-CM | POA: Diagnosis not present

## 2020-10-31 DIAGNOSIS — L578 Other skin changes due to chronic exposure to nonionizing radiation: Secondary | ICD-10-CM | POA: Diagnosis not present

## 2020-10-31 DIAGNOSIS — L821 Other seborrheic keratosis: Secondary | ICD-10-CM | POA: Diagnosis not present

## 2020-10-31 DIAGNOSIS — L570 Actinic keratosis: Secondary | ICD-10-CM | POA: Diagnosis not present

## 2020-10-31 DIAGNOSIS — E785 Hyperlipidemia, unspecified: Secondary | ICD-10-CM | POA: Diagnosis not present

## 2020-10-31 DIAGNOSIS — K219 Gastro-esophageal reflux disease without esophagitis: Secondary | ICD-10-CM | POA: Diagnosis not present

## 2020-10-31 DIAGNOSIS — E559 Vitamin D deficiency, unspecified: Secondary | ICD-10-CM | POA: Diagnosis not present

## 2020-10-31 DIAGNOSIS — I872 Venous insufficiency (chronic) (peripheral): Secondary | ICD-10-CM | POA: Diagnosis not present

## 2020-10-31 DIAGNOSIS — I10 Essential (primary) hypertension: Secondary | ICD-10-CM | POA: Diagnosis not present

## 2020-10-31 DIAGNOSIS — D0439 Carcinoma in situ of skin of other parts of face: Secondary | ICD-10-CM | POA: Diagnosis not present

## 2020-11-21 DIAGNOSIS — K222 Esophageal obstruction: Secondary | ICD-10-CM | POA: Diagnosis not present

## 2021-01-16 DIAGNOSIS — H353221 Exudative age-related macular degeneration, left eye, with active choroidal neovascularization: Secondary | ICD-10-CM | POA: Diagnosis not present

## 2021-02-18 ENCOUNTER — Other Ambulatory Visit: Payer: Self-pay

## 2021-02-18 ENCOUNTER — Encounter (HOSPITAL_COMMUNITY)
Admission: EM | Disposition: A | Discharge status: Home / Self Care | Payer: Self-pay | Source: Home / Self Care | Attending: Emergency Medicine

## 2021-02-18 ENCOUNTER — Other Ambulatory Visit (HOSPITAL_COMMUNITY): Payer: Medicare Other

## 2021-02-18 ENCOUNTER — Inpatient Hospital Stay (HOSPITAL_BASED_OUTPATIENT_CLINIC_OR_DEPARTMENT_OTHER): Payer: Medicare Other

## 2021-02-18 ENCOUNTER — Emergency Department (HOSPITAL_COMMUNITY): Payer: Medicare Other

## 2021-02-18 ENCOUNTER — Encounter (HOSPITAL_COMMUNITY): Payer: Self-pay | Admitting: Emergency Medicine

## 2021-02-18 ENCOUNTER — Observation Stay (HOSPITAL_COMMUNITY)
Admission: EM | Admit: 2021-02-18 | Discharge: 2021-02-19 | Disposition: A | Discharge status: Home / Self Care | Payer: Medicare Other | Attending: Internal Medicine | Admitting: Internal Medicine

## 2021-02-18 ENCOUNTER — Other Ambulatory Visit (HOSPITAL_COMMUNITY): Payer: Self-pay

## 2021-02-18 DIAGNOSIS — I10 Essential (primary) hypertension: Secondary | ICD-10-CM | POA: Diagnosis not present

## 2021-02-18 DIAGNOSIS — E785 Hyperlipidemia, unspecified: Secondary | ICD-10-CM | POA: Diagnosis present

## 2021-02-18 DIAGNOSIS — I480 Paroxysmal atrial fibrillation: Secondary | ICD-10-CM

## 2021-02-18 DIAGNOSIS — R001 Bradycardia, unspecified: Secondary | ICD-10-CM | POA: Diagnosis not present

## 2021-02-18 DIAGNOSIS — E782 Mixed hyperlipidemia: Secondary | ICD-10-CM | POA: Diagnosis not present

## 2021-02-18 DIAGNOSIS — Z7982 Long term (current) use of aspirin: Secondary | ICD-10-CM | POA: Insufficient documentation

## 2021-02-18 DIAGNOSIS — I495 Sick sinus syndrome: Principal | ICD-10-CM

## 2021-02-18 DIAGNOSIS — Z79899 Other long term (current) drug therapy: Secondary | ICD-10-CM | POA: Insufficient documentation

## 2021-02-18 DIAGNOSIS — Z87891 Personal history of nicotine dependence: Secondary | ICD-10-CM | POA: Insufficient documentation

## 2021-02-18 DIAGNOSIS — I4891 Unspecified atrial fibrillation: Secondary | ICD-10-CM | POA: Insufficient documentation

## 2021-02-18 DIAGNOSIS — Z959 Presence of cardiac and vascular implant and graft, unspecified: Secondary | ICD-10-CM

## 2021-02-18 DIAGNOSIS — Z743 Need for continuous supervision: Secondary | ICD-10-CM | POA: Diagnosis not present

## 2021-02-18 DIAGNOSIS — I499 Cardiac arrhythmia, unspecified: Secondary | ICD-10-CM | POA: Diagnosis not present

## 2021-02-18 DIAGNOSIS — R6889 Other general symptoms and signs: Secondary | ICD-10-CM | POA: Diagnosis not present

## 2021-02-18 DIAGNOSIS — R9431 Abnormal electrocardiogram [ECG] [EKG]: Secondary | ICD-10-CM | POA: Diagnosis not present

## 2021-02-18 DIAGNOSIS — R55 Syncope and collapse: Secondary | ICD-10-CM

## 2021-02-18 DIAGNOSIS — Z20822 Contact with and (suspected) exposure to covid-19: Secondary | ICD-10-CM | POA: Insufficient documentation

## 2021-02-18 DIAGNOSIS — R079 Chest pain, unspecified: Secondary | ICD-10-CM | POA: Diagnosis not present

## 2021-02-18 DIAGNOSIS — R404 Transient alteration of awareness: Secondary | ICD-10-CM | POA: Diagnosis not present

## 2021-02-18 HISTORY — DX: Paroxysmal atrial fibrillation: I48.0

## 2021-02-18 HISTORY — DX: Sick sinus syndrome: I49.5

## 2021-02-18 HISTORY — DX: Syncope and collapse: R55

## 2021-02-18 HISTORY — PX: PACEMAKER IMPLANT: EP1218

## 2021-02-18 LAB — BASIC METABOLIC PANEL
Anion gap: 11 (ref 5–15)
BUN: 19 mg/dL (ref 8–23)
CO2: 25 mmol/L (ref 22–32)
Calcium: 8.8 mg/dL — ABNORMAL LOW (ref 8.9–10.3)
Chloride: 102 mmol/L (ref 98–111)
Creatinine, Ser: 1.07 mg/dL — ABNORMAL HIGH (ref 0.44–1.00)
GFR, Estimated: 51 mL/min — ABNORMAL LOW (ref >60)
Glucose, Bld: 112 mg/dL — ABNORMAL HIGH (ref 70–99)
Potassium: 3.8 mmol/L (ref 3.5–5.1)
Sodium: 138 mmol/L (ref 135–145)

## 2021-02-18 LAB — CBC WITH DIFFERENTIAL/PLATELET
Abs Immature Granulocytes: 0.21 10*3/uL — ABNORMAL HIGH (ref 0.00–0.07)
Basophils Absolute: 0.2 10*3/uL — ABNORMAL HIGH (ref 0.0–0.1)
Basophils Relative: 1 %
Eosinophils Absolute: 0.2 10*3/uL (ref 0.0–0.5)
Eosinophils Relative: 1 %
HCT: 32.1 % — ABNORMAL LOW (ref 36.0–46.0)
Hemoglobin: 10.2 g/dL — ABNORMAL LOW (ref 12.0–15.0)
Immature Granulocytes: 2 %
Lymphocytes Relative: 14 %
Lymphs Abs: 1.9 10*3/uL (ref 0.7–4.0)
MCH: 32.8 pg (ref 26.0–34.0)
MCHC: 31.8 g/dL (ref 30.0–36.0)
MCV: 103.2 fL — ABNORMAL HIGH (ref 80.0–100.0)
Monocytes Absolute: 0.9 10*3/uL (ref 0.1–1.0)
Monocytes Relative: 7 %
Neutro Abs: 10 10*3/uL — ABNORMAL HIGH (ref 1.7–7.7)
Neutrophils Relative %: 75 %
Platelets: 493 10*3/uL — ABNORMAL HIGH (ref 150–400)
RBC: 3.11 MIL/uL — ABNORMAL LOW (ref 3.87–5.11)
RDW: 19.2 % — ABNORMAL HIGH (ref 11.5–15.5)
WBC: 13.4 10*3/uL — ABNORMAL HIGH (ref 4.0–10.5)
nRBC: 0.1 % (ref 0.0–0.2)

## 2021-02-18 LAB — ECHOCARDIOGRAM COMPLETE
Height: 63 in
S' Lateral: 2.4 cm
Weight: 2480 oz

## 2021-02-18 LAB — TSH: TSH: 3.279 u[IU]/mL (ref 0.350–4.500)

## 2021-02-18 LAB — RESP PANEL BY RT-PCR (FLU A&B, COVID) ARPGX2
Influenza A by PCR: NEGATIVE
Influenza B by PCR: NEGATIVE
SARS Coronavirus 2 by RT PCR: NEGATIVE

## 2021-02-18 LAB — TROPONIN I (HIGH SENSITIVITY)
Troponin I (High Sensitivity): 9 ng/L (ref <18)
Troponin I (High Sensitivity): 11 ng/L (ref <18)

## 2021-02-18 SURGERY — PACEMAKER IMPLANT

## 2021-02-18 MED ORDER — HEPARIN (PORCINE) 25000 UT/250ML-% IV SOLN
1000.0000 [IU]/h | INTRAVENOUS | Status: DC
  Administered 2021-02-18: 1000 [IU]/h via INTRAVENOUS
  Filled 2021-02-18: qty 250

## 2021-02-18 MED ORDER — ACETAMINOPHEN 325 MG PO TABS: 650.0000 mg | ORAL_TABLET | ORAL | Status: DC | PRN

## 2021-02-18 MED ORDER — HYDROCODONE-ACETAMINOPHEN 5-325 MG PO TABS: 1.0000 | ORAL_TABLET | ORAL | Status: DC | PRN

## 2021-02-18 MED ORDER — SODIUM CHLORIDE 0.9 % IV SOLN: 250.0000 mL | INTRAVENOUS | Status: DC | PRN

## 2021-02-18 MED ORDER — ACETAMINOPHEN 325 MG PO TABS
325.0000 mg | ORAL_TABLET | ORAL | Status: DC | PRN
  Administered 2021-02-18: 325 mg via ORAL
  Filled 2021-02-18: qty 2

## 2021-02-18 MED ORDER — SODIUM CHLORIDE 0.9 % IV SOLN
INTRAVENOUS | Status: AC
  Filled 2021-02-18: qty 2

## 2021-02-18 MED ORDER — SODIUM CHLORIDE 0.9% FLUSH
3.0000 mL | Freq: Two times a day (BID) | INTRAVENOUS | Status: DC
  Administered 2021-02-18 – 2021-02-19 (×2): 3 mL via INTRAVENOUS

## 2021-02-18 MED ORDER — MIDAZOLAM HCL 5 MG/5ML IJ SOLN
INTRAMUSCULAR | Status: DC | PRN
  Administered 2021-02-18: 1 mg via INTRAVENOUS

## 2021-02-18 MED ORDER — CHLORHEXIDINE GLUCONATE 4 % EX LIQD
60.0000 mL | Freq: Once | CUTANEOUS | Status: DC
Start: 2021-02-18 — End: 2021-02-18

## 2021-02-18 MED ORDER — SODIUM CHLORIDE 0.9 % IV SOLN
250.0000 mL | INTRAVENOUS | Status: DC
Start: 2021-02-18 — End: 2021-02-18

## 2021-02-18 MED ORDER — VALSARTAN 80 MG PO TABS
320.0000 mg | ORAL_TABLET | Freq: Every day | ORAL | Status: DC
  Administered 2021-02-19: 320 mg via ORAL
  Filled 2021-02-18 (×3): qty 4

## 2021-02-18 MED ORDER — SODIUM CHLORIDE 0.9% FLUSH: 3.0000 mL | INTRAVENOUS | Status: DC | PRN

## 2021-02-18 MED ORDER — LIDOCAINE HCL (PF) 1 % IJ SOLN
INTRAMUSCULAR | Status: DC | PRN
  Administered 2021-02-18: 60 mL

## 2021-02-18 MED ORDER — INFLUENZA VAC A&B SA ADJ QUAD 0.5 ML IM PRSY
0.5000 mL | PREFILLED_SYRINGE | INTRAMUSCULAR | Status: DC
  Filled 2021-02-18 (×2): qty 0.5

## 2021-02-18 MED ORDER — METOPROLOL SUCCINATE ER 50 MG PO TB24
50.0000 mg | ORAL_TABLET | Freq: Every day | ORAL | Status: DC
  Administered 2021-02-18: 50 mg via ORAL
  Filled 2021-02-18: qty 1

## 2021-02-18 MED ORDER — HEPARIN (PORCINE) IN NACL 1000-0.9 UT/500ML-% IV SOLN
INTRAVENOUS | Status: AC
  Filled 2021-02-18: qty 500

## 2021-02-18 MED ORDER — HYDROCHLOROTHIAZIDE 25 MG PO TABS: 25.0000 mg | ORAL_TABLET | Freq: Every day | ORAL | Status: DC

## 2021-02-18 MED ORDER — HEPARIN BOLUS VIA INFUSION
4000.0000 [IU] | Freq: Once | INTRAVENOUS | Status: AC
  Administered 2021-02-18: 4000 [IU] via INTRAVENOUS
  Filled 2021-02-18: qty 4000

## 2021-02-18 MED ORDER — ONDANSETRON HCL 4 MG/2ML IJ SOLN: 4.0000 mg | Freq: Four times a day (QID) | INTRAMUSCULAR | Status: DC | PRN

## 2021-02-18 MED ORDER — SODIUM CHLORIDE 0.9% FLUSH: 3.0000 mL | Freq: Two times a day (BID) | INTRAVENOUS | Status: DC

## 2021-02-18 MED ORDER — HEPARIN (PORCINE) 25000 UT/250ML-% IV SOLN
1000.0000 [IU]/h | INTRAVENOUS | Status: DC
  Administered 2021-02-18: 1000 [IU]/h via INTRAVENOUS

## 2021-02-18 MED ORDER — LIDOCAINE HCL (PF) 1 % IJ SOLN
INTRAMUSCULAR | Status: AC
  Filled 2021-02-18: qty 60

## 2021-02-18 MED ORDER — CEFAZOLIN SODIUM-DEXTROSE 1-4 GM/50ML-% IV SOLN
1.0000 g | Freq: Four times a day (QID) | INTRAVENOUS | Status: AC
Start: 2021-02-18 — End: 2021-02-19
  Administered 2021-02-18 – 2021-02-19 (×3): 1 g via INTRAVENOUS
  Filled 2021-02-18 (×4): qty 50

## 2021-02-18 MED ORDER — ALBUTEROL (5 MG/ML) CONTINUOUS INHALATION SOLN: 10.0000 mg/h | INHALATION_SOLUTION | RESPIRATORY_TRACT | Status: DC

## 2021-02-18 MED ORDER — CEFAZOLIN SODIUM-DEXTROSE 2-4 GM/100ML-% IV SOLN
INTRAVENOUS | Status: AC
  Filled 2021-02-18: qty 100

## 2021-02-18 MED ORDER — SODIUM CHLORIDE 0.9 % IV SOLN
80.0000 mg | INTRAVENOUS | Status: AC
  Administered 2021-02-18: 80 mg
  Filled 2021-02-18: qty 2

## 2021-02-18 MED ORDER — SODIUM CHLORIDE 0.9 % IV SOLN
INTRAVENOUS | Status: DC
Start: 2021-02-18 — End: 2021-02-18

## 2021-02-18 MED ORDER — OCUVITE-LUTEIN PO CAPS: 1.0000 | ORAL_CAPSULE | Freq: Every day | ORAL | Status: DC

## 2021-02-18 MED ORDER — CHLORHEXIDINE GLUCONATE 4 % EX LIQD: 60.0000 mL | Freq: Once | CUTANEOUS | Status: DC

## 2021-02-18 MED ORDER — PANTOPRAZOLE SODIUM 40 MG PO TBEC
40.0000 mg | DELAYED_RELEASE_TABLET | Freq: Two times a day (BID) | ORAL | Status: DC
  Administered 2021-02-18 – 2021-02-19 (×3): 40 mg via ORAL
  Filled 2021-02-18 (×3): qty 1

## 2021-02-18 MED ORDER — VITAMIN D 25 MCG (1000 UNIT) PO TABS
1000.0000 [IU] | ORAL_TABLET | Freq: Every day | ORAL | Status: DC
  Administered 2021-02-18 – 2021-02-19 (×2): 1000 [IU] via ORAL
  Filled 2021-02-18 (×2): qty 1

## 2021-02-18 MED ORDER — VALSARTAN 80 MG PO TABS
320.0000 mg | ORAL_TABLET | Freq: Every day | ORAL | Status: DC
  Filled 2021-02-18: qty 2

## 2021-02-18 MED ORDER — CEFAZOLIN SODIUM-DEXTROSE 2-4 GM/100ML-% IV SOLN
2.0000 g | INTRAVENOUS | Status: AC
  Administered 2021-02-18: 2 g via INTRAVENOUS

## 2021-02-18 MED ORDER — MIDAZOLAM HCL 5 MG/5ML IJ SOLN
INTRAMUSCULAR | Status: AC
  Filled 2021-02-18: qty 5

## 2021-02-18 MED ORDER — ADULT MULTIVITAMIN W/MINERALS CH
1.0000 | ORAL_TABLET | Freq: Every day | ORAL | Status: DC
  Administered 2021-02-18 – 2021-02-19 (×2): 1 via ORAL
  Filled 2021-02-18 (×2): qty 1

## 2021-02-18 MED ORDER — HEPARIN (PORCINE) IN NACL 1000-0.9 UT/500ML-% IV SOLN
INTRAVENOUS | Status: DC | PRN
  Administered 2021-02-18: 500 mL

## 2021-02-18 SURGICAL SUPPLY — 7 items
CABLE SURGICAL S-101-97-12 (CABLE) ×2 IMPLANT
LEAD TENDRIL MRI 46CM LPA1200M (Lead) ×1 IMPLANT
LEAD TENDRIL MRI 58CM LPA1200M (Lead) ×1 IMPLANT
PACEMAKER ASSURITY DR-RF (Pacemaker) ×1 IMPLANT
PAD PRO RADIOLUCENT 2001M-C (PAD) ×2 IMPLANT
SHEATH 8FR PRELUDE SNAP 13 (SHEATH) ×3 IMPLANT
TRAY PACEMAKER INSERTION (PACKS) ×2 IMPLANT

## 2021-02-18 NOTE — ED Notes (Signed)
Cardiology at bedside.

## 2021-02-18 NOTE — ED Notes (Signed)
RN spoke with cardiology regarding pts irregular rhythm. MD said he would come see pt.

## 2021-02-18 NOTE — Progress Notes (Signed)
ANTICOAGULATION CONSULT NOTE - Initial Consult  Pharmacy Consult for Heparin Indication: atrial fibrillation  Allergies  Allergen Reactions   Ace Inhibitors Cough    Patient Measurements: Height: 5\' 3"  (160 cm) Weight: 70.3 kg (155 lb) IBW/kg (Calculated) : 52.4 Heparin Dosing Weight: 68 kg  Vital Signs: Temp: 97.5 F (36.4 C) (09/20 0602) Temp Source: Temporal (09/20 0602) BP: 102/81 (09/20 0605) Pulse Rate: 107 (09/20 0605)  Labs: No results for input(s): HGB, HCT, PLT, APTT, LABPROT, INR, HEPARINUNFRC, HEPRLOWMOCWT, CREATININE, CKTOTAL, CKMB, TROPONINIHS in the last 72 hours.  CrCl cannot be calculated (No successful lab value found.).   Medical History: Past Medical History:  Diagnosis Date   Hyperlipidemia    Hypertension    Osteopenia    Varicose veins    Vitamin D deficiency     Medications:  See electronic med rec  Assessment: 86 y.o. F presents with afib. To begin heparin. CBC ok on admission. No AC PTA.  Goal of Therapy:  Heparin level 0.3-0.7 units/ml Monitor platelets by anticoagulation protocol: Yes   Plan:  Heparin IV bolus 4000 Heparin gtt at 1000 units/hr Will f/u heparin level in 8 hours Daily heparin level and CBC  Sherlon Handing, PharmD, BCPS Please see amion for complete clinical pharmacist phone list 02/18/2021,6:18 AM

## 2021-02-18 NOTE — TOC Benefit Eligibility Note (Signed)
Patient Advocate Encounter  Insurance verification completed.    The patient is currently admitted and upon discharge could be taking Eliquis 5 mg.  The current 30 day co-pay is, $47.00.   The patient is insured through [employer] Medicare Part D    Latisha Lasch, CPhT Pharmacy Patient Advocate Specialist [place] [facility] Team Direct Number: [phone]  Fax: [phone]        

## 2021-02-18 NOTE — Progress Notes (Signed)
Patient transferred from cath lab at 1845hrs.  Dressing to pacer site with small ooze noted.  Right arm in sling. Reviewed post pacer instructions and oriented patient to unit. Patient verbalized understanding. Daughter at bedside.

## 2021-02-18 NOTE — ED Provider Notes (Signed)
8:17 AM Care of the patient assumed at the change of shift, awaiting cardiology consult for sinus pauses. She is still in afib, having pauses more frequently and longer lasting.  Spoke with Maylon Peppers, NP with Cardiology who reports their team is on the way to see her now.    Truddie Hidden, MD 02/18/21 [phone]

## 2021-02-18 NOTE — ED Notes (Signed)
Heparin stopped per MD

## 2021-02-18 NOTE — ED Provider Notes (Addendum)
[facility] EMERGENCY DEPARTMENT Provider Note   CSN: [id number] Arrival date & time: 02/18/21  0549     History Chief Complaint  Patient presents with   Bradycardia    DYAN LABARBERA is a 86 y.o. female.  Patient presents to the emergency department for evaluation of near syncope.  Patient reports that she has been having spells of feeling dizziness for approximately a year.  She never really got it checked out.  Tonight she was having episodes where she would get chest discomfort and feel like she was going to pass out.  She comes to the ER by ambulance.  EMS reports significant pauses in rhythm on monitor.  These pauses coincide with the patient's symptoms.      Past Medical History:  Diagnosis Date   Hyperlipidemia    Hypertension    Osteopenia    Varicose veins    Vitamin D deficiency     Patient Active Problem List   Diagnosis Date Noted   Hypertension 08/30/2018   Hyperlipidemia 08/30/2018   GERD (gastroesophageal reflux disease) 08/30/2018   Chest pain in adult 08/30/2018   Lumbar radiculopathy 08/30/2018   Displacement of lumbar intervertebral disc without myelopathy 08/30/2018   Osteopenia 08/30/2018   Closed displaced fracture of head of right radius 11/30/2016       OB History   No obstetric history on file.     Family History  Problem Relation Age of Onset   Heart disease Mother    Diabetes Mother    Hypertension Mother    Lung cancer Father    Breast cancer Sister     Social History   Tobacco Use   Smoking status: Former    Types: Cigarettes    Quit date: 01/13/2007    Years since quitting: 14.1   Smokeless tobacco: Never  Substance Use Topics   Alcohol use: No   Drug use: Never    Home Medications Prior to Admission medications   Medication Sig Start Date End Date Taking? Authorizing Provider  aspirin 81 MG tablet Take 81 mg by mouth daily.   Yes [provider]  aspirin EC 325 MG tablet Take 325 mg by  mouth every 4 (four) hours as needed (chest pain).   Yes [provider]  cholecalciferol (VITAMIN D) 1000 UNITS tablet Take 1,000 Units by mouth daily.   Yes [provider]  dimenhyDRINATE (DRAMAMINE) 50 MG tablet Take 100 mg by mouth every 8 (eight) hours as needed for dizziness.   Yes [provider]  meclizine (ANTIVERT) 25 MG tablet Take 25 mg by mouth 3 (three) times daily as needed. 03/29/18  Yes [provider]  metoprolol succinate (TOPROL-XL) 50 MG 24 hr tablet Take 50 mg by mouth at bedtime. 07/04/18  Yes [provider]  Multiple Vitamin (MULTIVITAMIN) tablet Take 1 tablet by mouth daily.   Yes [provider]  multivitamin-lutein (OCUVITE-LUTEIN) CAPS capsule Take 1 capsule by mouth in the morning and at bedtime.   Yes [provider]  pantoprazole (PROTONIX) 40 MG tablet Take 40 mg by mouth 2 (two) times daily. 07/04/18  Yes [provider]  valsartan-hydrochlorothiazide (DIOVAN-HCT) 320-25 MG tablet Take 1 tablet by mouth daily. 08/15/18  Yes [provider]    Allergies    Ace inhibitors  Review of Systems   Review of Systems  Neurological:  Positive for dizziness and weakness.  All other systems reviewed and are negative.  Physical Exam Updated Vital Signs  BP 102/81   Pulse (!) 107   Temp (!) 97.5 F (36.4 C) (Temporal)   Resp (!) 24   Ht 5\' 3"  (1.6 m)   Wt 70.3 kg   SpO2 99%   BMI 27.46 kg/m   Physical Exam Vitals and nursing note reviewed.  Constitutional:      General: She is not in acute distress.    Appearance: Normal appearance. She is well-developed.  HENT:     Head: Normocephalic and atraumatic.     Right Ear: Hearing normal.     Left Ear: Hearing normal.     Nose: Nose normal.  Eyes:     Conjunctiva/sclera: Conjunctivae normal.     Pupils: Pupils are equal, round, and reactive to light.  Cardiovascular:     Rate and Rhythm: Rhythm irregular.     Heart sounds: S1 normal  and S2 normal. No murmur heard.   No friction rub. No gallop.  Pulmonary:     Effort: Pulmonary effort is normal. No respiratory distress.     Breath sounds: Normal breath sounds.  Chest:     Chest wall: No tenderness.  Abdominal:     General: Bowel sounds are normal.     Palpations: Abdomen is soft.     Tenderness: There is no abdominal tenderness. There is no guarding or rebound. Negative signs include Murphy's sign and McBurney's sign.     Hernia: No hernia is present.  Musculoskeletal:        General: Normal range of motion.     Cervical back: Normal range of motion and neck supple.  Skin:    General: Skin is warm and dry.     Findings: No rash.  Neurological:     Mental Status: She is alert and oriented to person, place, and time.     GCS: GCS eye subscore is 4. GCS verbal subscore is 5. GCS motor subscore is 6.     Cranial Nerves: No cranial nerve deficit.     Sensory: No sensory deficit.     Coordination: Coordination normal.  Psychiatric:        Speech: Speech normal.        Behavior: Behavior normal.        Thought Content: Thought content normal.    ED Results / Procedures / Treatments   Labs (all labs ordered are listed, but only abnormal results are displayed) Labs Reviewed  RESP PANEL BY RT-PCR (FLU A&B, COVID) ARPGX2  CBC WITH DIFFERENTIAL/PLATELET  BASIC METABOLIC PANEL  HEPARIN LEVEL (UNFRACTIONATED)  TROPONIN I (HIGH SENSITIVITY)    EKG EKG Interpretation  Date/Time:  Tuesday February 18 2021 05:56:56 EDT Ventricular Rate:  92 PR Interval:    QRS Duration: 90 QT Interval:  334 QTC Calculation: 414 R Axis:   -66 Text Interpretation: Atrial fibrillation Left anterior fascicular block Abnormal R-wave progression, early transition Consider anterior infarct Confirmed by Orpah Greek [phone]) on 02/18/2021 6:06:10 AM      Radiology No results found.  Procedures Procedures   Medications Ordered in ED Medications  heparin bolus via  infusion 4,000 Units (has no administration in time range)  heparin ADULT infusion 100 units/mL (25000 units/255mL) (has no administration in time range)    ED Course  I have reviewed the triage vital signs and the nursing notes.  Pertinent labs & imaging results that were available during my care of the patient were reviewed by me and considered in my medical decision making (see chart for details).  MDM Rules/Calculators/A&P     CHA2DS2-VASc Score: 4                     Patient presents with near syncope.  Patient has been having frequent episodes tonight.  Patient presents in atrial fibrillation with intermittent rapid ventricular response followed by long pauses.  Pauses are as much as 6 to 7 seconds at times and are symptomatic.  No complete loss of consciousness.  External pacer pads placed on patient.  Work-up initiated and will consult cardiology.  Addendum: Discussed with Dr. Debara Pickett, will see patient.   This patients CHA2DS2-VASc Score and unadjusted Ischemic Stroke Rate (% per year) is equal to 4.8 % stroke rate/year from a score of 4  Above score calculated as 1 point each if present [CHF, HTN, DM, Vascular=MI/PAD/Aortic Plaque, Age if 65-74, or Female] Above score calculated as 2 points each if present [Age > 75, or Stroke/TIA/TE]     CRITICAL CARE Performed by: Orpah Greek   Total critical care time: 31 minutes  Critical care time was exclusive of separately billable procedures and treating other patients.  Critical care was necessary to treat or prevent imminent or life-threatening deterioration.  Critical care was time spent personally by me on the following activities: development of treatment plan with patient and/or surrogate as well as nursing, discussions with consultants, evaluation of patient's response to treatment, examination of patient, obtaining history from patient or surrogate, ordering and performing treatments and interventions, ordering  and review of laboratory studies, ordering and review of radiographic studies, pulse oximetry and re-evaluation of patient's condition.   Final Clinical Impression(s) / ED Diagnoses Final diagnoses:  Bradycardia  Atrial fibrillation with RVR [facility])    Rx / DC Orders ED Discharge Orders     None        Najmah Carradine, Gwenyth Allegra, MD 02/18/21 3662    Orpah Greek, MD 02/18/21 [phone]

## 2021-02-18 NOTE — H&P (Addendum)
ADMISSION HISTORY & PHYSICAL  Patient Name: Alexandra Kim Date of Encounter: 02/18/2021 Primary Care Physician: [name], MD Cardiologist: None  Chief Complaint   syncope  Patient Profile   86 yo female with pauses and syncope on beta-blocker  HPI   This is a 86 y.o. female with a past medical history significant for hypertension on combination valsartan HCTZ and Toprol XL 50 mg at night, with also history of longstanding disequilibrium and near syncope which she thought was an inner ear issue.  Over the past several days she has become progressively weaker and felt like she may pass out.  This morning she felt very unwell and nearly passed out.  EMS was called and on arrival was noted to be in what appears to be atrial fibrillation with rapid ventricular response but had a long 7-second pause.  Subsequently she had numerous other long pauses and was brought to the trauma bay.  When I went to evaluate her she was noted to have a termination pause and went back into sinus rhythm after about a 10 to 12-second period of asystole.  Other medical problems include osteopenia, varicose veins and vitamin D deficiency as well as dyslipidemia and she says she gets regular doctor visits.  She denies any cardiac history.  There is a history of heart disease in her mother as well as hypertension and diabetes.  PMHx   Past Medical History:  Diagnosis Date   Hyperlipidemia    Hypertension    Osteopenia    Varicose veins    Vitamin D deficiency     Past Surgical History:  Procedure Laterality Date   ABDOMINAL HYSTERECTOMY  1990    FAMHx   Family History  Problem Relation Age of Onset   Heart disease Mother    Diabetes Mother    Hypertension Mother    Lung cancer Father    Breast cancer Sister     SOCHx    reports that she quit smoking about 14 years ago. Her smoking use included cigarettes. She has never used smokeless tobacco. She reports that she does not drink  alcohol and does not use drugs.  Outpatient Medications   No current facility-administered medications on file prior to encounter.   Current Outpatient Medications on File Prior to Encounter  Medication Sig Dispense Refill   aspirin 81 MG tablet Take 81 mg by mouth daily.     aspirin EC 325 MG tablet Take 325 mg by mouth every 4 (four) hours as needed (chest pain).     cholecalciferol (VITAMIN D) 1000 UNITS tablet Take 1,000 Units by mouth daily.     dimenhyDRINATE (DRAMAMINE) 50 MG tablet Take 100 mg by mouth every 8 (eight) hours as needed for dizziness.     meclizine (ANTIVERT) 25 MG tablet Take 25 mg by mouth 3 (three) times daily as needed.     metoprolol succinate (TOPROL-XL) 50 MG 24 hr tablet Take 50 mg by mouth at bedtime.     Multiple Vitamin (MULTIVITAMIN) tablet Take 1 tablet by mouth daily.     multivitamin-lutein (OCUVITE-LUTEIN) CAPS capsule Take 1 capsule by mouth in the morning and at bedtime.     pantoprazole (PROTONIX) 40 MG tablet Take 40 mg by mouth 2 (two) times daily.     valsartan-hydrochlorothiazide (DIOVAN-HCT) 320-25 MG tablet Take 1 tablet by mouth daily.      Inpatient Medications    Scheduled Meds:   Continuous Infusions:  heparin 1,000 Units/hr (02/18/21 0737)  PRN Meds:    ALLERGIES   Allergies  Allergen Reactions   Ace Inhibitors Cough    ROS   Pertinent items noted in HPI and remainder of comprehensive ROS otherwise negative.  Vitals   Vitals:   02/18/21 0800 02/18/21 0815 02/18/21 0830 02/18/21 0850  BP: 120/73 (!) 109/58 115/62 (!) 98/53  Pulse: 94 (!) 57 61 (!) 58  Resp: (!) 21 15 18 15   Temp:      TempSrc:      SpO2: 99% 99% 100% 100%  Weight:      Height:       No intake or output data in the 24 hours ending 02/18/21 0854 Filed Weights   02/18/21 0604  Weight: 70.3 kg    Physical Exam   General appearance: alert, no distress, and lying supine Neck: no carotid bruit, no JVD, and thyroid not enlarged, symmetric,  no tenderness/mass/nodules Lungs: clear to auscultation bilaterally Heart: regular rate and rhythm, S1, S2 normal, no murmur, click, rub or gallop Abdomen: soft, non-tender; bowel sounds normal; no masses,  no organomegaly Extremities: extremities normal, atraumatic, no cyanosis or edema Pulses: 2+ and symmetric Skin: Skin color, texture, turgor normal. No rashes or lesions Neurologic: Grossly normal Psych: Pleasant  Labs   Results for orders placed or performed during the hospital encounter of 02/18/21 (from the past 48 hour(s))  CBC with Differential/Platelet     Status: Abnormal   Collection Time: 02/18/21  5:57 AM  Result Value Ref Range   WBC 13.4 (H) 4.0 - 10.5 K/uL   RBC 3.11 (L) 3.87 - 5.11 MIL/uL   Hemoglobin 10.2 (L) 12.0 - 15.0 g/dL   HCT 32.1 (L) 36.0 - 46.0 %   MCV 103.2 (H) 80.0 - 100.0 fL   MCH 32.8 26.0 - 34.0 pg   MCHC 31.8 30.0 - 36.0 g/dL   RDW 19.2 (H) 11.5 - 15.5 %   Platelets 493 (H) 150 - 400 K/uL   nRBC 0.1 0.0 - 0.2 %   Neutrophils Relative % 75 %   Neutro Abs 10.0 (H) 1.7 - 7.7 K/uL   Lymphocytes Relative 14 %   Lymphs Abs 1.9 0.7 - 4.0 K/uL   Monocytes Relative 7 %   Monocytes Absolute 0.9 0.1 - 1.0 K/uL   Eosinophils Relative 1 %   Eosinophils Absolute 0.2 0.0 - 0.5 K/uL   Basophils Relative 1 %   Basophils Absolute 0.2 (H) 0.0 - 0.1 K/uL   Immature Granulocytes 2 %   Abs Immature Granulocytes 0.21 (H) 0.00 - 0.07 K/uL    Comment: Performed at [facility], [address], [place], [place] [address]  Basic metabolic panel     Status: Abnormal   Collection Time: 02/18/21  5:57 AM  Result Value Ref Range   Sodium 138 135 - 145 mmol/L   Potassium 3.8 3.5 - 5.1 mmol/L   Chloride 102 98 - 111 mmol/L   CO2 25 22 - 32 mmol/L   Glucose, Bld 112 (H) 70 - 99 mg/dL    Comment: Glucose reference range applies only to samples taken after fasting for at least 8 hours.   BUN 19 8 - 23 mg/dL   Creatinine, Ser 1.07 (H) 0.44 - 1.00 mg/dL   Calcium 8.8  (L) 8.9 - 10.3 mg/dL   GFR, Estimated 51 (L) >60 mL/min    Comment: (NOTE) Calculated using the CKD-EPI Creatinine Equation (2021)    Anion gap 11 5 - 15  Comment: Performed at [facility], [place] [address] [place], [place] [address]  Troponin I (High Sensitivity)     Status: None   Collection Time: 02/18/21  5:57 AM  Result Value Ref Range   Troponin I (High Sensitivity) 9 <18 ng/L    Comment: (NOTE) Elevated high sensitivity troponin I (hsTnI) values and significant  changes across serial measurements may suggest ACS but many other  chronic and acute conditions are known to elevate hsTnI results.  Refer to the "Links" section for chest pain algorithms and additional  guidance. Performed at [facility], [place] [address], [place], [place] [address]   Resp Panel by RT-PCR (Flu A&B, Covid) Nasopharyngeal Swab     Status: None   Collection Time: 02/18/21  6:02 AM   Specimen: Nasopharyngeal Swab; Nasopharyngeal(NP) swabs in vial transport medium  Result Value Ref Range   SARS Coronavirus 2 by RT PCR NEGATIVE NEGATIVE    Comment: (NOTE) SARS-CoV-2 target nucleic acids are NOT DETECTED.  The SARS-CoV-2 RNA is generally detectable in upper respiratory specimens during the acute phase of infection. The lowest concentration of SARS-CoV-2 viral copies this assay can detect is 138 copies/mL. A negative result does not preclude SARS-Cov-2 infection and should not be used as the sole basis for treatment or other patient management decisions. A negative result may occur with  improper specimen collection/handling, submission of specimen other than nasopharyngeal swab, presence of viral mutation(s) within the areas targeted by this assay, and inadequate number of viral copies(<138 copies/mL). A negative result must be combined with clinical observations, patient history, and epidemiological information. The expected result is Negative.  Fact Sheet for Patients:   EntrepreneurPulse.com.au  Fact Sheet for Healthcare Providers:  IncredibleEmployment.be  This test is no t yet approved or cleared by the [place] FDA and  has been authorized for detection and/or diagnosis of SARS-CoV-2 by FDA under an Emergency Use Authorization (EUA). This EUA will remain  in effect (meaning this test can be used) for the duration of the COVID-19 declaration under Section 564(b)(1) of the Act, 21 U.S.C.section 360bbb-3(b)(1), unless the authorization is terminated  or revoked sooner.       Influenza A by PCR NEGATIVE NEGATIVE   Influenza B by PCR NEGATIVE NEGATIVE    Comment: (NOTE) The Xpert Xpress SARS-CoV-2/FLU/RSV plus assay is intended as an aid in the diagnosis of influenza from Nasopharyngeal swab specimens and should not be used as a sole basis for treatment. Nasal washings and aspirates are unacceptable for Xpert Xpress SARS-CoV-2/FLU/RSV testing.  Fact Sheet for Patients: EntrepreneurPulse.com.au  Fact Sheet for Healthcare Providers: IncredibleEmployment.be  This test is not yet approved or cleared by the [place] FDA and has been authorized for detection and/or diagnosis of SARS-CoV-2 by FDA under an Emergency Use Authorization (EUA). This EUA will remain in effect (meaning this test can be used) for the duration of the COVID-19 declaration under Section 564(b)(1) of the Act, 21 U.S.C. section 360bbb-3(b)(1), unless the authorization is terminated or revoked.  Performed at [facility], [place] [address], [place], [place] [address]     ECG   A. fib at 80 with long pauses- Personally Reviewed  Telemetry   A. fib with RVR and intermittent pauses up to 12 seconds, now sinus rhythm- Personally Reviewed  Radiology   DG Chest Port 1 View  Result Date: 02/18/2021 CLINICAL DATA:  86 year old female with bradycardia and chest pain. EXAM: PORTABLE CHEST 1  VIEW COMPARISON:  Chest  radiograph 08/29/2018. FINDINGS: Portable AP semi upright view at 0603 hours. Pacer or resuscitation pads project over the chest. Cardiac and mediastinal contours are stable and within normal limits. Stable lung volumes. Visualized tracheal air column is within normal limits. Allowing for portable technique the lungs are clear. No pneumothorax or pleural effusion. No acute osseous abnormality identified. Negative visible bowel gas pattern. IMPRESSION: No acute cardiopulmonary abnormality. Electronically Signed   By: Genevie Ann M.D.   On: 02/18/2021 06:25    Cardiac Studies   None  Assessment   Principal Problem:   Tachy-brady syndrome (HCC) Active Problems:   Hypertension   Hyperlipidemia   A-fib (HCC)   Pre-syncope   Plan   New onset afib - This appears to be new onset atrial fibrillation which is complicated by pauses and fortunately pause-terminated to sinus rhythm this morning.  Her CHA2DS2-VASc score is at least 4 (Age >75, Female, HTN) - she will ultimately need anticoagulation, would recommend Eliquis 5 mg BID at discharge. Given that she has had RVR on metoprolol and concomitant pauses, she will likely need a pacemaker for "tachy-brady" syndrome. Have asked cardiac EP to evaluate. Will get a 2D echo as well. ER has started heparin will continue but this will have to be held for pacer. She has converted to sinus rhythm.  Tachy-brady syndrome with pauses / presyncope -She has had rapid A. fib on metoprolol but this is interspersed with symptomatic pauses.  Discontinuing the beta-blocker is necessary however she will likely have recurrent A. fib with RVR which could be difficult to control without a pacemaker.  Ultimately I believe she will need that and have consulted EP for this. This is the likely cause of her pre-syncope.  Hypertension  -Would avoid AVN blocking meds to control hypertension. Continue Valsartan/HCTZ - can  add amlodipine for additional BP  control.  Dyslipidemia - Not on medication for this, check fasting lipid profile tomorrow am.  FULL CODE  Time Spent Directly with Patient:  I have spent a total of 45 minutes with patient reviewing hospital notes, telemetry, EKGs, labs and examining the patient as well as establishing an assessment and plan that was discussed with the patient.  > 50% of time was spent in direct patient care.   Length of Stay:  LOS: 0 days   Pixie Casino, MD, [facility], [place] Director of the Advanced Lipid Disorders &  Cardiovascular Risk Reduction Clinic Diplomate of the American Board of Clinical Lipidology Attending Cardiologist  Direct Dial: [phone]  Fax: [phone]  Website:  [email or web address] 02/18/2021, 8:54 AM

## 2021-02-18 NOTE — Consult Note (Addendum)
Cardiology Consultation:   Patient ID: Alexandra Kim MRN: [id number]; DOB: 02/21/1935  Admit date: 02/18/2021 Date of Consult: 02/18/2021  PCP:  Alexandra Dress, MD   [facility] Providers Cardiologist:  new Dr. Debara Pickett  Patient Profile:   Alexandra Kim is a 86 y.o. female with a hx of HTN, chronic disequilibrium,  osteopenia, varicose veins and vitamin D deficiency as well as dyslipidemia who is being seen 02/18/2021 for the evaluation of tachy brady with new AFib RVR and pauses at the request of Dr. Debara Pickett.  History of Present Illness:   [name] has not particular prior cardiac history. Recalls perhaps a couple years ago having "angina" and a stress test in [place] that she thinks was OK. She reports about a year or so of occasional somewhat fleeting dizzy spells that she has attributed to inner ear problems.  Though particularly yesterday somewhat suddenly sreally felt weak, tired and poorly.  Somewhat nauseous as well, no CP, no overt SOB.  She started to  have recurrent near syncope that she was able to get seated/laying and has not fainted. About 4am this AM she woke feeling "really bad", weak, lightheaded persistently in fact worse when supine and called 911 She mentions they told her they were having a hard time getting her BP. She has had a couple slight dizzy feelings when they were bringing her and perhaps a couple hours ago, none really since. No cardiac awareness, no palpitations   LABS K+ 3.8 BUN/Creat 19/1.07 HS Trop 9, 11 WBC 13.4 H/H 10/32 Plts 493  Home meds include Toprol 50mg  QD (for HTN), last dose last evening about 2200 No other nodal blockers noted   Past Medical History:  Diagnosis Date   Hyperlipidemia    Hypertension    Osteopenia    Varicose veins    Vitamin D deficiency     Past Surgical History:  Procedure Laterality Date   ABDOMINAL HYSTERECTOMY  1990     Home Medications:  Prior to Admission medications   Medication Sig Start  Date End Date Taking? Authorizing Provider  aspirin 81 MG tablet Take 81 mg by mouth daily.   Yes [provider]  aspirin EC 325 MG tablet Take 325 mg by mouth every 4 (four) hours as needed (chest pain).   Yes [provider]  cholecalciferol (VITAMIN D) 1000 UNITS tablet Take 1,000 Units by mouth daily.   Yes [provider]  dimenhyDRINATE (DRAMAMINE) 50 MG tablet Take 100 mg by mouth every 8 (eight) hours as needed for dizziness.   Yes [provider]  meclizine (ANTIVERT) 25 MG tablet Take 25 mg by mouth 3 (three) times daily as needed. 03/29/18  Yes [provider]  metoprolol succinate (TOPROL-XL) 50 MG 24 hr tablet Take 50 mg by mouth at bedtime. 07/04/18  Yes [provider]  Multiple Vitamin (MULTIVITAMIN) tablet Take 1 tablet by mouth daily.   Yes [provider]  multivitamin-lutein (OCUVITE-LUTEIN) CAPS capsule Take 1 capsule by mouth in the morning and at bedtime.   Yes [provider]  pantoprazole (PROTONIX) 40 MG tablet Take 40 mg by mouth 2 (two) times daily. 07/04/18  Yes [provider]  valsartan-hydrochlorothiazide (DIOVAN-HCT) 320-25 MG tablet Take 1 tablet by mouth daily. 08/15/18  Yes [provider]    Inpatient Medications: Scheduled Meds:  cholecalciferol  1,000 Units Oral Daily   hydrochlorothiazide  25 mg Oral Daily   multivitamin with minerals  1 tablet Oral Daily  pantoprazole  40 mg Oral BID   sodium chloride flush  3 mL Intravenous Q12H   valsartan  320 mg Oral Daily   Continuous Infusions:  sodium chloride     heparin 1,000 Units/hr (02/18/21 0937)   PRN Meds: sodium chloride, acetaminophen, sodium chloride flush  Allergies:    Allergies  Allergen Reactions   Ace Inhibitors Cough    Social History:   Social History   Socioeconomic History   Marital status: Widowed    Spouse name: Not on file   Number of children: Not on file   Years of education: Not on  file   Highest education level: Not on file  Occupational History   Not on file  Tobacco Use   Smoking status: Former    Types: Cigarettes    Quit date: 01/13/2007    Years since quitting: 14.1   Smokeless tobacco: Never  Substance and Sexual Activity   Alcohol use: No   Drug use: Never   Sexual activity: Not on file  Other Topics Concern   Not on file  Social History Narrative   Not on file   Social Determinants of Health   Financial Resource Strain: Not on file  Food Insecurity: Not on file  Transportation Needs: Not on file  Physical Activity: Not on file  Stress: Not on file  Social Connections: Not on file  Intimate Partner Violence: Not on file    Family History:   Family History  Problem Relation Age of Onset   Heart disease Mother    Diabetes Mother    Hypertension Mother    Lung cancer Father    Breast cancer Sister      ROS:  Please see the history of present illness.  All other ROS reviewed and negative.     Physical Exam/Data:   Vitals:   02/18/21 0850 02/18/21 0900 02/18/21 0930 02/18/21 0945  BP: (!) 98/53 98/67 (!) 102/54 (!) 106/53  Pulse: (!) 58 (!) 58 (!) 59 (!) 57  Resp: 15 13 19 18   Temp:      TempSrc:      SpO2: 100% 100% 99% 99%  Weight:      Height:        Intake/Output Summary (Last 24 hours) at 02/18/2021 1026 Last data filed at 02/18/2021 0934 Gross per 24 hour  Intake 20 ml  Output --  Net 20 ml   Last 3 Weights 02/18/2021 01/11/2013  Weight (lbs) 155 lb 179 lb  Weight (kg) 70.308 kg 81.194 kg     Body mass index is 27.46 kg/m.  General:  Well nourished, well developed, in no acute distress HEENT: normal Neck: no JVD Vascular: No carotid bruits; Distal pulses 2+ bilaterally Cardiac:  RRR; no murmurs, gallops or rubs Lungs:  CTA b/l, no wheezing, rhonchi or rales  Abd: soft, nontender  Ext: no edema Musculoskeletal:  No deformities Skin: warm and dry  Neuro:  no focal abnormalities noted Psych:  Normal affect    EKG:  The EKG was personally reviewed and demonstrates:   Afib 92bpm, QRS 31ms, poor R rpogression Pauses, 1st of unknown duration > 1.6sec >  followed by 1.4sec > AFib 90's AFib with pauses, 2.4 sec,1.6sec (one of unknown duration, beginning not seen AFib, shorter pauses 1-2 sec  Telemetry:  Telemetry was personally reviewed and demonstrates:    EMS tracing photos reviewed with long pauses of 5 tonearly 6 seconds AFib 110s PAFib here with post termination pausing most 4 seconds  or so, she has had 7 and 10 second pauses noted  Currently maintaining SB/SR 50's-60's, transiently dips 39-40's  Relevant CV Studies:  No historical cardiac dadta Echo has been ordered is pending  Laboratory Data:  High Sensitivity Troponin:   Recent Labs  Lab 02/18/21 0557 02/18/21 0846  TROPONINIHS 9 11     Chemistry Recent Labs  Lab 02/18/21 0557  NA 138  K 3.8  CL 102  CO2 25  GLUCOSE 112*  BUN 19  CREATININE 1.07*  CALCIUM 8.8*  GFRNONAA 51*  ANIONGAP 11    No results for input(s): PROT, ALBUMIN, AST, ALT, ALKPHOS, BILITOT in the last 168 hours. Lipids No results for input(s): CHOL, TRIG, HDL, LABVLDL, LDLCALC, CHOLHDL in the last 168 hours.  Hematology Recent Labs  Lab 02/18/21 0557  WBC 13.4*  RBC 3.11*  HGB 10.2*  HCT 32.1*  MCV 103.2*  MCH 32.8  MCHC 31.8  RDW 19.2*  PLT 493*   Thyroid No results for input(s): TSH, FREET4 in the last 168 hours.  BNPNo results for input(s): BNP, PROBNP in the last 168 hours.  DDimer No results for input(s): DDIMER in the last 168 hours.   Radiology/Studies:  DG Chest Port 1 View  Result Date: 02/18/2021 CLINICAL DATA:  86 year old female with bradycardia and chest pain. EXAM: PORTABLE CHEST 1 VIEW COMPARISON:  Chest radiograph 08/29/2018. FINDINGS: Portable AP semi upright view at 0603 hours. Pacer or resuscitation pads project over the chest. Cardiac and mediastinal contours are stable and within normal limits. Stable lung  volumes. Visualized tracheal air column is within normal limits. Allowing for portable technique the lungs are clear. No pneumothorax or pleural effusion. No acute osseous abnormality identified. Negative visible bowel gas pattern. IMPRESSION: No acute cardiopulmonary abnormality. Electronically Signed   By: Genevie Ann M.D.   On: 02/18/2021 06:25     Assessment and Plan:   Recurrent dizziness,  near syncope 2.   New AFib w/ RVR/FVR CHA2DS2Vasc is 4 Started on heparin gtt here, held for possible pacer 3.   Sinus node dysfunction Home Toprol held  I agree and think she will need pacing with evidence of tachy-brady syndrome I discussed rational for PPM, the implant procedure and potential risks/benefits. Will hold heparin and see how today's schedule goes  The patient is agreeable to proceed with PPM pending d/w MD Her daughter at bedside mentions there was some discussion of observing her overnight 1st, though if felt pacing indicated is agreeable  Her BP is stable, SBP 100's, will hold antihypertensives for now Pacer pads are on the pt, she is on Zoll Echo ordered for stat  TSH ordered     Risk Assessment/Risk Scores:     For questions or updates, please contact [facility] Please consult [email or web address] for contact info under    Signed, Baldwin Jamaica, PA-C  02/18/2021 10:26 AM   I have seen, examined the patient, and reviewed the above assessment and plan.  Changes to above are made where necessary.  On exam, RRR.  She presents with tachycardia bradycardia syndrome.  She has afib with RVR as well as symptomatic post termination pauses and syncope with RR intervals of up to 10 seconds.  The patient has symptomatic bradycardia.  I would therefore recommend pacemaker implantation at this time.  Risks, benefits, alternatives to pacemaker implantation were discussed in detail with the patient today. The patient understands that the risks include but are not limited to bleeding,  infection, pneumothorax, perforation, tamponade, vascular  damage, renal failure, MI, stroke, death,  and lead dislodgement and wishes to proceed.    Co Sign: Thompson Grayer, MD 02/18/2021 3:54 PM

## 2021-02-18 NOTE — ED Triage Notes (Signed)
Pt transported from home with episodes of near syncope since yesterday with nausea +shob, Per EMS pt noted to have episodes of HR ranging from 50's to 140's. During transport pt began having extended pauses +symptomatic #18 R AC, #20L AC. 300-446ml NS gvien by EMS. Pt A & O on arrival

## 2021-02-18 NOTE — H&P (View-Only) (Signed)
Cardiology Consultation:   Patient ID: Alexandra Kim MRN: [id number]; DOB: 03-Jan-1935  Admit date: 02/18/2021 Date of Consult: 02/18/2021  PCP:  [name], MD   [facility] Providers Cardiologist:  new Dr. Debara Pickett  Patient Profile:   Alexandra Kim is a 86 y.o. female with a hx of HTN, chronic disequilibrium,  osteopenia, varicose veins and vitamin D deficiency as well as dyslipidemia who is being seen 02/18/2021 for the evaluation of tachy brady with new AFib RVR and pauses at the request of Dr. Debara Pickett.  History of Present Illness:   [name] has not particular prior cardiac history. Recalls perhaps a couple years ago having "angina" and a stress test in [place] that she thinks was OK. She reports about a year or so of occasional somewhat fleeting dizzy spells that she has attributed to inner ear problems.  Though particularly yesterday somewhat suddenly sreally felt weak, tired and poorly.  Somewhat nauseous as well, no CP, no overt SOB.  She started to  have recurrent near syncope that she was able to get seated/laying and has not fainted. About 4am this AM she woke feeling "really bad", weak, lightheaded persistently in fact worse when supine and called 911 She mentions they told her they were having a hard time getting her BP. She has had a couple slight dizzy feelings when they were bringing her and perhaps a couple hours ago, none really since. No cardiac awareness, no palpitations   LABS K+ 3.8 BUN/Creat 19/1.07 HS Trop 9, 11 WBC 13.4 H/H 10/32 Plts 493  Home meds include Toprol 50mg  QD (for HTN), last dose last evening about 2200 No other nodal blockers noted   Past Medical History:  Diagnosis Date   Hyperlipidemia    Hypertension    Osteopenia    Varicose veins    Vitamin D deficiency     Past Surgical History:  Procedure Laterality Date   ABDOMINAL HYSTERECTOMY  1990     Home Medications:  Prior to Admission medications   Medication Sig Start  Date End Date Taking? Authorizing Provider  aspirin 81 MG tablet Take 81 mg by mouth daily.   Yes [provider]  aspirin EC 325 MG tablet Take 325 mg by mouth every 4 (four) hours as needed (chest pain).   Yes [provider]  cholecalciferol (VITAMIN D) 1000 UNITS tablet Take 1,000 Units by mouth daily.   Yes [provider]  dimenhyDRINATE (DRAMAMINE) 50 MG tablet Take 100 mg by mouth every 8 (eight) hours as needed for dizziness.   Yes [provider]  meclizine (ANTIVERT) 25 MG tablet Take 25 mg by mouth 3 (three) times daily as needed. 03/29/18  Yes [provider]  metoprolol succinate (TOPROL-XL) 50 MG 24 hr tablet Take 50 mg by mouth at bedtime. 07/04/18  Yes [provider]  Multiple Vitamin (MULTIVITAMIN) tablet Take 1 tablet by mouth daily.   Yes [provider]  multivitamin-lutein (OCUVITE-LUTEIN) CAPS capsule Take 1 capsule by mouth in the morning and at bedtime.   Yes [provider]  pantoprazole (PROTONIX) 40 MG tablet Take 40 mg by mouth 2 (two) times daily. 07/04/18  Yes [provider]  valsartan-hydrochlorothiazide (DIOVAN-HCT) 320-25 MG tablet Take 1 tablet by mouth daily. 08/15/18  Yes [provider]    Inpatient Medications: Scheduled Meds:  cholecalciferol  1,000 Units Oral Daily   hydrochlorothiazide  25 mg Oral Daily   multivitamin with minerals  1 tablet Oral Daily  pantoprazole  40 mg Oral BID   sodium chloride flush  3 mL Intravenous Q12H   valsartan  320 mg Oral Daily   Continuous Infusions:  sodium chloride     heparin 1,000 Units/hr (02/18/21 0937)   PRN Meds: sodium chloride, acetaminophen, sodium chloride flush  Allergies:    Allergies  Allergen Reactions   Ace Inhibitors Cough    Social History:   Social History   Socioeconomic History   Marital status: Widowed    Spouse name: Not on file   Number of children: Not on file   Years of education: Not on  file   Highest education level: Not on file  Occupational History   Not on file  Tobacco Use   Smoking status: Former    Types: Cigarettes    Quit date: 01/13/2007    Years since quitting: 14.1   Smokeless tobacco: Never  Substance and Sexual Activity   Alcohol use: No   Drug use: Never   Sexual activity: Not on file  Other Topics Concern   Not on file  Social History Narrative   Not on file   Social Determinants of Health   Financial Resource Strain: Not on file  Food Insecurity: Not on file  Transportation Needs: Not on file  Physical Activity: Not on file  Stress: Not on file  Social Connections: Not on file  Intimate Partner Violence: Not on file    Family History:   Family History  Problem Relation Age of Onset   Heart disease Mother    Diabetes Mother    Hypertension Mother    Lung cancer Father    Breast cancer Sister      ROS:  Please see the history of present illness.  All other ROS reviewed and negative.     Physical Exam/Data:   Vitals:   02/18/21 0850 02/18/21 0900 02/18/21 0930 02/18/21 0945  BP: (!) 98/53 98/67 (!) 102/54 (!) 106/53  Pulse: (!) 58 (!) 58 (!) 59 (!) 57  Resp: 15 13 19 18   Temp:      TempSrc:      SpO2: 100% 100% 99% 99%  Weight:      Height:        Intake/Output Summary (Last 24 hours) at 02/18/2021 1026 Last data filed at 02/18/2021 0934 Gross per 24 hour  Intake 20 ml  Output --  Net 20 ml   Last 3 Weights 02/18/2021 01/11/2013  Weight (lbs) 155 lb 179 lb  Weight (kg) 70.308 kg 81.194 kg     Body mass index is 27.46 kg/m.  General:  Well nourished, well developed, in no acute distress HEENT: normal Neck: no JVD Vascular: No carotid bruits; Distal pulses 2+ bilaterally Cardiac:  RRR; no murmurs, gallops or rubs Lungs:  CTA b/l, no wheezing, rhonchi or rales  Abd: soft, nontender  Ext: no edema Musculoskeletal:  No deformities Skin: warm and dry  Neuro:  no focal abnormalities noted Psych:  Normal affect    EKG:  The EKG was personally reviewed and demonstrates:   Afib 92bpm, QRS 19ms, poor R rpogression Pauses, 1st of unknown duration > 1.6sec >  followed by 1.4sec > AFib 90's AFib with pauses, 2.4 sec,1.6sec (one of unknown duration, beginning not seen AFib, shorter pauses 1-2 sec  Telemetry:  Telemetry was personally reviewed and demonstrates:    EMS tracing photos reviewed with long pauses of 5 tonearly 6 seconds AFib 110s PAFib here with post termination pausing most 4 seconds  or so, she has had 7 and 10 second pauses noted  Currently maintaining SB/SR 50's-60's, transiently dips 39-40's  Relevant CV Studies:  No historical cardiac dadta Echo has been ordered is pending  Laboratory Data:  High Sensitivity Troponin:   Recent Labs  Lab 02/18/21 0557 02/18/21 0846  TROPONINIHS 9 11     Chemistry Recent Labs  Lab 02/18/21 0557  NA 138  K 3.8  CL 102  CO2 25  GLUCOSE 112*  BUN 19  CREATININE 1.07*  CALCIUM 8.8*  GFRNONAA 51*  ANIONGAP 11    No results for input(s): PROT, ALBUMIN, AST, ALT, ALKPHOS, BILITOT in the last 168 hours. Lipids No results for input(s): CHOL, TRIG, HDL, LABVLDL, LDLCALC, CHOLHDL in the last 168 hours.  Hematology Recent Labs  Lab 02/18/21 0557  WBC 13.4*  RBC 3.11*  HGB 10.2*  HCT 32.1*  MCV 103.2*  MCH 32.8  MCHC 31.8  RDW 19.2*  PLT 493*   Thyroid No results for input(s): TSH, FREET4 in the last 168 hours.  BNPNo results for input(s): BNP, PROBNP in the last 168 hours.  DDimer No results for input(s): DDIMER in the last 168 hours.   Radiology/Studies:  DG Chest Port 1 View  Result Date: 02/18/2021 CLINICAL DATA:  86 year old female with bradycardia and chest pain. EXAM: PORTABLE CHEST 1 VIEW COMPARISON:  Chest radiograph 08/29/2018. FINDINGS: Portable AP semi upright view at 0603 hours. Pacer or resuscitation pads project over the chest. Cardiac and mediastinal contours are stable and within normal limits. Stable lung  volumes. Visualized tracheal air column is within normal limits. Allowing for portable technique the lungs are clear. No pneumothorax or pleural effusion. No acute osseous abnormality identified. Negative visible bowel gas pattern. IMPRESSION: No acute cardiopulmonary abnormality. Electronically Signed   By: Genevie Ann M.D.   On: 02/18/2021 06:25     Assessment and Plan:   Recurrent dizziness,  near syncope 2.   New AFib w/ RVR/FVR CHA2DS2Vasc is 4 Started on heparin gtt here, held for possible pacer 3.   Sinus node dysfunction Home Toprol held  I agree and think she will need pacing with evidence of tachy-brady syndrome I discussed rational for PPM, the implant procedure and potential risks/benefits. Will hold heparin and see how today's schedule goes  The patient is agreeable to proceed with PPM pending d/w MD Her daughter at bedside mentions there was some discussion of observing her overnight 1st, though if felt pacing indicated is agreeable  Her BP is stable, SBP 100's, will hold antihypertensives for now Pacer pads are on the pt, she is on Zoll Echo ordered for stat  TSH ordered     Risk Assessment/Risk Scores:     For questions or updates, please contact [facility] Please consult [email or web address] for contact info under    Signed, Baldwin Jamaica, PA-C  02/18/2021 10:26 AM   I have seen, examined the patient, and reviewed the above assessment and plan.  Changes to above are made where necessary.  On exam, RRR.  She presents with tachycardia bradycardia syndrome.  She has afib with RVR as well as symptomatic post termination pauses and syncope with RR intervals of up to 10 seconds.  The patient has symptomatic bradycardia.  I would therefore recommend pacemaker implantation at this time.  Risks, benefits, alternatives to pacemaker implantation were discussed in detail with the patient today. The patient understands that the risks include but are not limited to bleeding,  infection, pneumothorax, perforation, tamponade, vascular  damage, renal failure, MI, stroke, death,  and lead dislodgement and wishes to proceed.    Co Sign: Thompson Grayer, MD 02/18/2021 3:54 PM

## 2021-02-18 NOTE — Interval H&P Note (Signed)
History and Physical Interval Note:  02/18/2021 3:56 PM  Alexandra Kim  has presented today for surgery, with the diagnosis of tach.  The various methods of treatment have been discussed with the patient and family. After consideration of risks, benefits and other options for treatment, the patient has consented to  Procedure(s): PACEMAKER IMPLANT (N/A) as a surgical intervention.  The patient's history has been reviewed, patient examined, no change in status, stable for surgery.  I have reviewed the patient's chart and labs.  Questions were answered to the patient's satisfaction.     Thompson Grayer

## 2021-02-18 NOTE — Plan of Care (Signed)
  Problem: Education: Goal: Knowledge of cardiac device and self-care will improve Outcome: Progressing   Problem: Education: Goal: Ability to safely manage health related needs after discharge will improve Outcome: Progressing   Problem: Cardiac: Goal: Ability to achieve and maintain adequate cardiopulmonary perfusion will improve Outcome: Progressing   Problem: Clinical Measurements: Goal: Ability to maintain clinical measurements within normal limits will improve Outcome: Progressing   Problem: Clinical Measurements: Goal: Will remain free from infection Outcome: Progressing   Problem: Clinical Measurements: Goal: Diagnostic test results will improve Outcome: Progressing   Problem: Clinical Measurements: Goal: Cardiovascular complication will be avoided Outcome: Progressing   Problem: Coping: Goal: Level of anxiety will decrease Outcome: Progressing   Problem: Safety: Goal: Ability to remain free from injury will improve Outcome: Progressing   Problem: Pain Managment: Goal: General experience of comfort will improve Outcome: Progressing   Problem: Skin Integrity: Goal: Risk for impaired skin integrity will decrease Outcome: Progressing

## 2021-02-18 NOTE — ED Notes (Signed)
Cardiology paged regarding pts irregular afib + HR 35

## 2021-02-18 NOTE — ED Notes (Signed)
Cardiologist at bedside.  

## 2021-02-18 NOTE — Progress Notes (Signed)
  Echocardiogram 2D Echocardiogram has been performed.  Johny Chess 02/18/2021, 12:33 PM

## 2021-02-18 NOTE — ED Notes (Signed)
Procedural consent for pacemaker implant signed

## 2021-02-19 ENCOUNTER — Inpatient Hospital Stay (HOSPITAL_COMMUNITY): Payer: Medicare Other

## 2021-02-19 ENCOUNTER — Encounter (HOSPITAL_COMMUNITY): Payer: Self-pay | Admitting: Internal Medicine

## 2021-02-19 DIAGNOSIS — J811 Chronic pulmonary edema: Secondary | ICD-10-CM | POA: Diagnosis not present

## 2021-02-19 DIAGNOSIS — Z7982 Long term (current) use of aspirin: Secondary | ICD-10-CM | POA: Diagnosis not present

## 2021-02-19 DIAGNOSIS — Z87891 Personal history of nicotine dependence: Secondary | ICD-10-CM | POA: Diagnosis not present

## 2021-02-19 DIAGNOSIS — Z79899 Other long term (current) drug therapy: Secondary | ICD-10-CM | POA: Diagnosis not present

## 2021-02-19 DIAGNOSIS — Z950 Presence of cardiac pacemaker: Secondary | ICD-10-CM | POA: Diagnosis not present

## 2021-02-19 DIAGNOSIS — I4891 Unspecified atrial fibrillation: Secondary | ICD-10-CM | POA: Diagnosis not present

## 2021-02-19 DIAGNOSIS — Z20822 Contact with and (suspected) exposure to covid-19: Secondary | ICD-10-CM | POA: Diagnosis not present

## 2021-02-19 DIAGNOSIS — I10 Essential (primary) hypertension: Secondary | ICD-10-CM | POA: Diagnosis not present

## 2021-02-19 DIAGNOSIS — I495 Sick sinus syndrome: Secondary | ICD-10-CM | POA: Diagnosis not present

## 2021-02-19 LAB — LIPID PANEL
Cholesterol: 97 mg/dL (ref 0–200)
HDL: 29 mg/dL — ABNORMAL LOW (ref >40)
LDL Cholesterol: 52 mg/dL (ref 0–99)
Total CHOL/HDL Ratio: 3.3 RATIO
Triglycerides: 82 mg/dL (ref <150)
VLDL: 16 mg/dL (ref 0–40)

## 2021-02-19 LAB — BASIC METABOLIC PANEL
Anion gap: 9 (ref 5–15)
BUN: 17 mg/dL (ref 8–23)
CO2: 25 mmol/L (ref 22–32)
Calcium: 8.8 mg/dL — ABNORMAL LOW (ref 8.9–10.3)
Chloride: 103 mmol/L (ref 98–111)
Creatinine, Ser: 1.02 mg/dL — ABNORMAL HIGH (ref 0.44–1.00)
GFR, Estimated: 54 mL/min — ABNORMAL LOW (ref >60)
Glucose, Bld: 110 mg/dL — ABNORMAL HIGH (ref 70–99)
Potassium: 3.8 mmol/L (ref 3.5–5.1)
Sodium: 137 mmol/L (ref 135–145)

## 2021-02-19 LAB — CBC
HCT: 27.5 % — ABNORMAL LOW (ref 36.0–46.0)
HCT: 31.7 % — ABNORMAL LOW (ref 36.0–46.0)
Hemoglobin: 8.9 g/dL — ABNORMAL LOW (ref 12.0–15.0)
Hemoglobin: 10.4 g/dL — ABNORMAL LOW (ref 12.0–15.0)
MCH: 33 pg (ref 26.0–34.0)
MCH: 33.2 pg (ref 26.0–34.0)
MCHC: 32.4 g/dL (ref 30.0–36.0)
MCHC: 32.8 g/dL (ref 30.0–36.0)
MCV: 101.9 fL — ABNORMAL HIGH (ref 80.0–100.0)
MCV: 101.3 fL — ABNORMAL HIGH (ref 80.0–100.0)
Platelets: 457 10*3/uL — ABNORMAL HIGH (ref 150–400)
Platelets: 502 10*3/uL — ABNORMAL HIGH (ref 150–400)
RBC: 2.7 MIL/uL — ABNORMAL LOW (ref 3.87–5.11)
RBC: 3.13 MIL/uL — ABNORMAL LOW (ref 3.87–5.11)
RDW: 19 % — ABNORMAL HIGH (ref 11.5–15.5)
RDW: 19.1 % — ABNORMAL HIGH (ref 11.5–15.5)
WBC: 17.4 10*3/uL — ABNORMAL HIGH (ref 4.0–10.5)
WBC: 20 10*3/uL — ABNORMAL HIGH (ref 4.0–10.5)
nRBC: 0.1 % (ref 0.0–0.2)
nRBC: 0.1 % (ref 0.0–0.2)

## 2021-02-19 MED ORDER — POTASSIUM CHLORIDE CRYS ER 20 MEQ PO TBCR
20.0000 meq | EXTENDED_RELEASE_TABLET | Freq: Once | ORAL | Status: AC
  Administered 2021-02-19: 20 meq via ORAL
  Filled 2021-02-19: qty 1

## 2021-02-19 NOTE — Care Management CC44 (Signed)
Condition Code 44 Documentation Completed  Patient Details  Name: Alexandra Kim MRN: [id number] Date of Birth: 05/20/1935   Condition Code 44 given:  Yes Patient signature on Condition Code 44 notice:  Yes Documentation of 2 MD's agreement:  Yes Code 44 added to claim:  Yes    Bethena Roys, RN 02/19/2021, 12:29 PM

## 2021-02-19 NOTE — Discharge Summary (Addendum)
ELECTROPHYSIOLOGY PROCEDURE DISCHARGE SUMMARY    Patient ID: Alexandra Kim,  MRN: [id number], DOB/AGE: 1934/09/29 86 y.o.  Admit date: 02/18/2021 Discharge date: 02/19/2021  Primary Care Physician: [name], MD Primary Cardiologist: Dr. Debara Pickett, new yesterday Electrophysiologist: Dr. Rayann Heman, new yesterday  Primary Discharge Diagnosis:  New AFib CHA2DS2Vasc Alexandra 4 Tachy-brady syndrome Symptomatic bradycardia status post pacemaker implantation this admission  Secondary Discharge Diagnosis:  HTN  Allergies  Allergen Reactions   Ace Inhibitors Cough     Procedures This Admission:  1.  Implantation of a Abbott dual chamber PPM on 02/18/21 by Dr Rayann Heman.   The patient received  Abbott Assurity MRI model [id number] (serial number  [id number]) pacemaker,Abbott Tendril MRI model LPA1200M- 46 (serial number  [id number]) right atrial lead and an Abbott Tendril MRI model [id number]  (serial number  [id number]) right ventricular lead  There were no immediate post procedure complications. CXR on 02/19/21 demonstrated no pneumothorax status post device implantation.   Brief HPI: Alexandra Kim Alexandra a 86 y.o. female with a hx of HTN, chronic disequilibrium,  osteopenia, varicose veins and vitamin D deficiency as well as dyslipidemia admitted with sudden onset of severe dizzy spells, acute weakness, and near syncope. EMS found her in fast AFib with several long pauses.  The same observed in the ER, post conversion and some pausing with return to Afib.  Cardiology consulted, Afib new, with fast rates and long pauses, one 10 seconds, EP consulted to consider pacing  Hospital Course:  The patient was admitted she was on Toprol at home, though picture of tachy-brady, and no other potential reversible causes recommended PPM.  Labs were largely unremarkable, WBC 13 without fever or symptoms of illness.  TTE noted preserved LVEF, no significant VHD.  No anginal symptoms. She underwent implantation of a  PPM with details as outlined above.  She was monitored on telemetry overnight which demonstrated SR, infrequent paced beats.  Left chest was without hematoma or ecchymosis.  The device was interrogated and found to be functioning normally.  CXR was obtained and demonstrated no pneumothorax status post device implantation.  Wound care, arm mobility, and restrictions were reviewed with the patient.  This morning her CBC came back with increased WBC to 17.4 and hgb down to 8.9, Hct 27.6, stat repeat noted her Hgb back to initial range 10.4/31.7 without intervention, though WBC up further 20.0. The patient feels quite well, she has minimal ache at her pacer site, no CP, no SOB, no cough or urinary symptoms.  She Alexandra afebrile, ambulating in the room with ease.  Denies any melena or obvious bleeding. Given this, planned to have early follow up with labs by her PMD, suspect leukocytosis may be stress induced.  She was examined by Dr. Rayann Heman and considered stable for discharge to home.   Resume home Toprol Hold ASA   I have made an appt with Dr. Delena Bali for Friday 10:00AM (office personnel that helped me reported the office does have access to [facility] records and MD will be able to see her hospitalization specifics) She will see our service in a week to follow up on labs as well, H/H and start Vado if stable/able, wound check  Physical Exam: Vitals:   02/18/21 1931 02/18/21 2235 02/19/21 0453 02/19/21 0849  BP: (!) 124/59 (!) 141/64 138/67 (!) 156/79  Pulse: 79  71   Resp: 20  18   Temp: 98.1 F (36.7 C)  98.1 F (36.7 C)   TempSrc: Oral  Oral   SpO2: 95%  94%   Weight:      Height:        GEN- The patient Alexandra well appearing, alert and oriented x 3 today.   HEENT: normocephalic, atraumatic; sclera clear, conjunctiva pink; hearing intact; oropharynx clear; neck supple, no JVP Lungs- CTA b/l, normal work of breathing.  No wheezes, rales, rhonchi Heart- RRR, no murmurs, rubs or gallops, PMI not laterally  displaced GI- soft, non-tender, non-distended Extremities- no clubbing, cyanosis, or edema MS- no significant deformity or atrophy Skin- warm and dry, no rash or lesion, left chest without hematoma/ecchymosis Psych- euthymic mood, full affect Neuro- no gross deficits   Labs:   Lab Results  Component Value Date   WBC 20.0 (H) 02/19/2021   HGB 10.4 (L) 02/19/2021   HCT 31.7 (L) 02/19/2021   MCV 101.3 (H) 02/19/2021   PLT 502 (H) 02/19/2021    Recent Labs  Lab 02/19/21 0212  NA 137  K 3.8  CL 103  CO2 25  BUN 17  CREATININE 1.02*  CALCIUM 8.8*  GLUCOSE 110*    Discharge Medications:  Allergies as of 02/19/2021       Reactions   Ace Inhibitors Cough        Medication List     STOP taking these medications    aspirin 81 MG tablet   aspirin EC 325 MG tablet       TAKE these medications    cholecalciferol 1000 units tablet Commonly known as: VITAMIN D Take 1,000 Units by mouth daily.   dimenhyDRINATE 50 MG tablet Commonly known as: DRAMAMINE Take 100 mg by mouth every 8 (eight) hours as needed for dizziness.   meclizine 25 MG tablet Commonly known as: ANTIVERT Take 25 mg by mouth 3 (three) times daily as needed.   metoprolol succinate 50 MG 24 hr tablet Commonly known as: TOPROL-XL Take 50 mg by mouth at bedtime.   multivitamin tablet Take 1 tablet by mouth daily.   multivitamin-lutein Caps capsule Take 1 capsule by mouth in the morning and at bedtime.   pantoprazole 40 MG tablet Commonly known as: PROTONIX Take 40 mg by mouth 2 (two) times daily.   valsartan-hydrochlorothiazide 320-25 MG tablet Commonly known as: DIOVAN-HCT Take 1 tablet by mouth daily.               Discharge Care Instructions  (From admission, onward)           Start     Ordered   02/19/21 0000  Discharge wound care:       Comments: As per AVS   02/19/21 1151            Disposition: Home Discharge Instructions     Diet - low sodium heart  healthy   Complete by: As directed    Discharge wound care:   Complete by: As directed    As per AVS   Increase activity slowly   Complete by: As directed        Follow-up Information     [name], MD Follow up.   Specialty: Internal Medicine Why: 02/21/21 @ 10:00AM Contact information: [place] [address] [phone]         Shirley Friar, PA-C Follow up.   Specialty: Physician Assistant Why: 02/26/21 @ 9:40AM, wound check follow up on medicines, discuss start of blood thinner as we discussed Contact information: [place] [address]  [phone]                 Duration of Discharge Encounter: Greater than 30 minutes including physician time.  Signed, Tommye Standard, PA-C 02/19/2021 1:57 PM  I have seen, examined the patient, and reviewed the above assessment and plan.  Changes to above are made where necessary.  On exam, RRR.  CXR reveals stable leads, no ptx.  She feels well and wishes to go home. Unclear elevation of WBC (No symptoms to localize).  We will schedule repeat labs with PCP later this week.  Co Sign: Thompson Grayer, MD 02/19/2021 9:25 PM

## 2021-02-19 NOTE — Plan of Care (Signed)

## 2021-02-19 NOTE — Progress Notes (Signed)
To Xray via wheelchair with the transporter.

## 2021-02-19 NOTE — Discharge Instructions (Addendum)
    Supplemental Discharge Instructions for  Pacemaker Patients    Activity No heavy lifting or vigorous activity with your left/right arm for 6 to 8 weeks.  Do not raise your left/right arm above your head for one week.  Gradually raise your affected arm as drawn below.              02/22/21                   02/23/21                    02/24/21                  02/25/21            __  NO DRIVING until cleared to at your wound check visit .  WOUND CARE Keep the wound area clean and dry.  Do not get this area wet , no showers until cleared to at your wound check visit The tape/steri-strips on your wound will fall off; do not pull them off.  No bandage is needed on the site.  DO  NOT apply any creams, oils, or ointments to the wound area. If you notice any drainage or discharge from the wound, any swelling or bruising at the site, or you develop a fever > 101? F after you are discharged home, call the office at once.  Special Instructions You are still able to use cellular telephones; use the ear opposite the side where you have your pacemaker/defibrillator.  Avoid carrying your cellular phone near your device. When traveling through airports, show security personnel your identification card to avoid being screened in the metal detectors.  Ask the security personnel to use the hand wand. Avoid arc welding equipment, MRI testing (magnetic resonance imaging), TENS units (transcutaneous nerve stimulators).  Call the office for questions about other devices. Avoid electrical appliances that are in poor condition or are not properly grounded. Microwave ovens are safe to be near or to operate.

## 2021-02-19 NOTE — Care Management Obs Status (Signed)
[facility] NOTIFICATION   Patient Details  Name: TAITUM MENTON MRN: [id number] Date of Birth: 02-11-1935   Medicare Observation Status Notification Given:  Yes    Bethena Roys, RN 02/19/2021, 12:29 PM

## 2021-02-21 ENCOUNTER — Ambulatory Visit: Payer: Self-pay | Admitting: *Deleted

## 2021-02-21 DIAGNOSIS — I495 Sick sinus syndrome: Secondary | ICD-10-CM | POA: Diagnosis not present

## 2021-02-21 DIAGNOSIS — I10 Essential (primary) hypertension: Secondary | ICD-10-CM | POA: Diagnosis not present

## 2021-02-21 DIAGNOSIS — D72829 Elevated white blood cell count, unspecified: Secondary | ICD-10-CM | POA: Diagnosis not present

## 2021-02-21 DIAGNOSIS — D62 Acute posthemorrhagic anemia: Secondary | ICD-10-CM | POA: Diagnosis not present

## 2021-02-21 DIAGNOSIS — I4891 Unspecified atrial fibrillation: Secondary | ICD-10-CM | POA: Diagnosis not present

## 2021-02-21 DIAGNOSIS — R001 Bradycardia, unspecified: Secondary | ICD-10-CM | POA: Diagnosis not present

## 2021-02-21 NOTE — Telephone Encounter (Signed)
Reason for Disposition  Extra heart beats OR irregular heart beating  (i.e., "palpitations")    Had a pace maker put in Wed. For tachy/brady syndrome  Answer Assessment - Initial Assessment Questions 1. RESPIRATORY STATUS: "Describe your breathing?" (e.g., wheezing, shortness of breath, unable to speak, severe coughing)      Daughter calling in, Alexandra Kim on the community line.    Her mother who is there with her had a pacemaker put in [place]. Afternoon.  Since being home she is short of breath and dizzy since yesterday afternoon.    She has not talked with cardiologist.   She saw her PCP this morning in [place], [place].    He said the O2 was fine.    She's dizzy and short of breath.   We are in [place].  She had the pacermaker inserted in [place]: "When did this breathing problem begin?"      She had the pace maker put in Wed afternoon.    Yesterday she started getting short of breath.   No chest pain. 3. PATTERN "Does the difficult breathing come and go, or has it been constant since it started?"      When she's up moving around she's short of breath.   When sitting down not dizzy but she is when she is up moving around. 4. SEVERITY: "How bad is your breathing?" (e.g., mild, moderate, severe)    - MILD: No SOB at rest, mild SOB with walking, speaks normally in sentences, can lie down, no retractions, pulse < 100.    - MODERATE: SOB at rest, SOB with minimal exertion and prefers to sit, cannot lie down flat, speaks in phrases, mild retractions, audible wheezing, pulse 100-120.    - SEVERE: Very SOB at rest, speaks in single words, struggling to breathe, sitting hunched forward, retractions, pulse > 120      Her O2 level fine this morning when went to PCP.   She is short of breath mostly while up moving around and dizzy too. 5. RECURRENT SYMPTOM: "Have you had difficulty breathing before?" If Yes, ask: "When was the last time?" and "What happened that time?"      She started having the  shortness of breath yesterday evening. 6. CARDIAC HISTORY: "Do you have any history of heart disease?" (e.g., heart attack, angina, bypass surgery, angioplasty)      Had a pace maker put in Wed. For  Tachy/brady syndrome at [facility].  7. LUNG HISTORY: "Do you have any history of lung disease?"  (e.g., pulmonary embolus, asthma, emphysema)     No  8. CAUSE: "What do you think is causing the breathing problem?"      Not sure.   It started yesterday evening.   She had the pace maker put in [place]. At [facility]   9. OTHER SYMPTOMS: "Do you have any other symptoms? (e.g., dizziness, runny nose, cough, chest pain, fever)     Dizzy and short of breath.    10. O2 SATURATION MONITOR:  "Do you use an oxygen saturation monitor (pulse oximeter) at home?" If Yes, "What is your reading (oxygen level) today?" "What is your usual oxygen saturation reading?" (e.g., 95%)       She was seen by her PCP in [place] this morning but they didn't discuss any of this.   Her O2 level was fine in the PCPs office. 11. PREGNANCY: "Is there any chance you are pregnant?" "When was your last menstrual  period?"       N/A due to age 86. TRAVEL: "Have you traveled out of the country in the last month?" (e.g., travel history, exposures)       Not asked  Protocols used: Breathing Difficulty-A-AH

## 2021-02-21 NOTE — Telephone Encounter (Signed)
Pt's daughter Anise Salvo called in on the community line.   Pt there with here answering questions.    Pt had a pacemaker inserted Wed. At [facility] for tachy/brady syndrome.   Starting yesterday afternoon she started having shortness of breath and dizziness.   Denies chest pain.   I asked if she had notified her cardiologist and she had not.   She did see her PCP in [place], [place] this morning but none of this was discussed per Mariann Laster.  See triage notes.  I referred her to the ED.   Mariann Laster is going to take her to the ED at [facility] (They are in [place] now) since that is where she had the pace maker inserted.

## 2021-02-24 ENCOUNTER — Telehealth: Payer: Self-pay | Admitting: Internal Medicine

## 2021-02-24 DIAGNOSIS — Z950 Presence of cardiac pacemaker: Secondary | ICD-10-CM | POA: Diagnosis not present

## 2021-02-24 DIAGNOSIS — D72829 Elevated white blood cell count, unspecified: Secondary | ICD-10-CM | POA: Diagnosis not present

## 2021-02-24 DIAGNOSIS — R8279 Other abnormal findings on microbiological examination of urine: Secondary | ICD-10-CM | POA: Diagnosis not present

## 2021-02-24 NOTE — Telephone Encounter (Signed)
Larene Beach from Dr. Delena Bali office calling to inform the patient had lab work and her white blood cell count was 19.9. She states the patient just had a pacemaker put in last week. And they will order a chest x ray, blood cultures times 2, and urine culture. Phone: [phone].

## 2021-02-25 NOTE — Progress Notes (Signed)
Electrophysiology Office Note Date: 02/26/2021  ID:  Alexandra Kim, DOB 12/09/1934, MRN [id number]  PCP: [name], MD Primary Cardiologist: Pixie Casino, MD Electrophysiologist: Thompson Grayer, MD   CC: Pacemaker follow-up  Alexandra Kim is a 86 y.o. female seen today for Thompson Grayer, MD for post hospital follow up.  Post op course complicated by elevated WBC of unclear significance.   Since discharge from hospital the patient reports doing OK. She has remained fatigue, and at times has brief tachy palpitations. No fever, chills, bleeding or drainage from site. No SOB or CP. No edema. No syncope. BCx and UCx pending from PCP on 9/26 with ongoing elevated WBC without symptoms. Daughter and Son-in-law are present today as well.   Device History: St. Jude Dual Chamber PPM implanted 02/18/21 for SSS  Past Medical History:  Diagnosis Date   Hyperlipidemia    Hypertension    Osteopenia    Varicose veins    Vitamin D deficiency    Past Surgical History:  Procedure Laterality Date   ABDOMINAL HYSTERECTOMY  1990   PACEMAKER IMPLANT N/A 02/18/2021   Procedure: PACEMAKER IMPLANT;  Surgeon: Thompson Grayer, MD;  Location: [facility];  Service: Cardiovascular;  Laterality: N/A;    Current Outpatient Medications  Medication Sig Dispense Refill   apixaban (ELIQUIS) 5 MG TABS tablet Take 1 tablet (5 mg total) by mouth 2 (two) times daily. 60 tablet 3   cholecalciferol (VITAMIN D) 1000 UNITS tablet Take 1,000 Units by mouth daily.     meclizine (ANTIVERT) 25 MG tablet Take 25 mg by mouth 3 (three) times daily as needed.     multivitamin-lutein (OCUVITE-LUTEIN) CAPS capsule Take 1 capsule by mouth in the morning and at bedtime.     pantoprazole (PROTONIX) 40 MG tablet Take 40 mg by mouth 2 (two) times daily.     valsartan-hydrochlorothiazide (DIOVAN-HCT) 320-25 MG tablet Take 1 tablet by mouth daily.     metoprolol succinate (TOPROL-XL) 50 MG 24 hr tablet Take 1.5 tablets  (75 mg total) by mouth at bedtime. 45 tablet 6   No current facility-administered medications for this visit.    Allergies:   Ace inhibitors   Social History: Social History   Socioeconomic History   Marital status: Widowed    Spouse name: Not on file   Number of children: Not on file   Years of education: Not on file   Highest education level: Not on file  Occupational History   Not on file  Tobacco Use   Smoking status: Former    Types: Cigarettes    Quit date: 01/13/2007    Years since quitting: 14.1   Smokeless tobacco: Never  Vaping Use   Vaping Use: Never used  Substance and Sexual Activity   Alcohol use: No   Drug use: Never   Sexual activity: Not on file  Other Topics Concern   Not on file  Social History Narrative   Not on file   Social Determinants of Health   Financial Resource Strain: Not on file  Food Insecurity: Not on file  Transportation Needs: Not on file  Physical Activity: Not on file  Stress: Not on file  Social Connections: Not on file  Intimate Partner Violence: Not on file    Family History: Family History  Problem Relation Age of Onset   Heart disease Mother    Diabetes Mother    Hypertension Mother    Lung cancer Father  Breast cancer Sister      Review of Systems: All other systems reviewed and are otherwise negative except as noted above.  Physical Exam: Vitals:   02/26/21 0933  BP: 134/74  Pulse: 64  SpO2: 94%  Weight: 167 lb 12.8 oz (76.1 kg)  Height: 5\' 3"  (1.6 m)     GEN- The patient is well appearing, alert and oriented x 3 today.   HEENT: normocephalic, atraumatic; sclera clear, conjunctiva pink; hearing intact; oropharynx clear; neck supple  Lungs- Clear to ausculation bilaterally, normal work of breathing.  No wheezes, rales, rhonchi Heart- Regular rate and rhythm, no murmurs, rubs or gallops  GI- soft, non-tender, non-distended, bowel sounds present  Extremities- no clubbing or cyanosis. No edema MS- no  significant deformity or atrophy Skin- warm and dry, no rash or lesion; PPM pocket well healed Psych- euthymic mood, full affect Neuro- strength and sensation are intact  PPM Interrogation- reviewed in detail today,  See PACEART report  EKG:  EKG is not ordered today.  Recent Labs: 02/18/2021: TSH 3.279 02/19/2021: BUN 17; Creatinine, Ser 1.02; Hemoglobin 10.4; Platelets 502; Potassium 3.8; Sodium 137   Wt Readings from Last 3 Encounters:  02/26/21 167 lb 12.8 oz (76.1 kg)  02/18/21 155 lb (70.3 kg)  01/11/13 179 lb (81.2 kg)     Other studies Reviewed: Additional studies/ records that were reviewed today include: Previous EP office notes, Previous remote checks, Most recent labwork.   Assessment and Plan:  1. Sick sinus syndrome s/p St. Jude PPM  Normal PPM function. Assessed but no charged today given proximity to her procedure. Thresholds, sensing, and impedence remained stable from implant No changes today  2. Paroxysmal atrial fibrillation CHA2DS2/VASc is at least 4. Burden remains low.  Increase toprol to 75 mg daily. Discussed at length. Start Eliquis 5 mg BID (Cr has been stable, only age is over threshold.)  Labs today  3. Elevated WBC of unclear significance No s/s of infection  BMET and repeat CBC today. BCx and UCx pending through PCP.  If remains elevated or trending upward, ? If will need prophylactic antibiotics.  Her steri-strips were NOT removed today, but overall site looks stable.    Current medicines are reviewed at length with the patient today.    Labs/ tests ordered today include:  Orders Placed This Encounter  Procedures   Basic metabolic panel   CBC   CUP PACEART INCLINIC DEVICE CHECK    Disposition:   Follow up with device clinic next week for wound check.   Jacalyn Lefevre, PA-C  02/26/2021 10:58 AM  [facility] [address] [place] [address] [phone] (office) [phone] (fax)

## 2021-02-26 ENCOUNTER — Ambulatory Visit (INDEPENDENT_AMBULATORY_CARE_PROVIDER_SITE_OTHER): Payer: Medicare Other | Admitting: Student

## 2021-02-26 ENCOUNTER — Encounter: Payer: Self-pay | Admitting: Student

## 2021-02-26 ENCOUNTER — Other Ambulatory Visit: Payer: Self-pay

## 2021-02-26 VITALS — BP 134/74 | HR 64 | Ht 63.0 in | Wt 167.8 lb

## 2021-02-26 DIAGNOSIS — I480 Paroxysmal atrial fibrillation: Secondary | ICD-10-CM | POA: Diagnosis not present

## 2021-02-26 DIAGNOSIS — I495 Sick sinus syndrome: Secondary | ICD-10-CM

## 2021-02-26 LAB — BASIC METABOLIC PANEL
BUN/Creatinine Ratio: 17 (ref 12–28)
BUN: 16 mg/dL (ref 8–27)
CO2: 25 mmol/L (ref 20–29)
Calcium: 9.4 mg/dL (ref 8.7–10.3)
Chloride: 97 mmol/L (ref 96–106)
Creatinine, Ser: 0.94 mg/dL (ref 0.57–1.00)
Glucose: 88 mg/dL (ref 70–99)
Potassium: 4.7 mmol/L (ref 3.5–5.2)
Sodium: 137 mmol/L (ref 134–144)
eGFR: 60 mL/min/{1.73_m2} (ref >59)

## 2021-02-26 LAB — CUP PACEART INCLINIC DEVICE CHECK
Battery Remaining Longevity: 139 mo
Battery Voltage: 3.14 V
Brady Statistic RA Percent Paced: 5.9 %
Brady Statistic RV Percent Paced: 0.39 %
Date Time Interrogation Session: 20220928105136
Implantable Lead Implant Date: 20220920
Implantable Lead Implant Date: 20220920
Implantable Lead Location: 753859
Implantable Lead Location: 753860
Implantable Pulse Generator Implant Date: 20220920
Lead Channel Impedance Value: 425 Ohm
Lead Channel Impedance Value: 637.5 Ohm
Lead Channel Pacing Threshold Amplitude: 0.5 V
Lead Channel Pacing Threshold Amplitude: 0.5 V
Lead Channel Pacing Threshold Amplitude: 0.75 V
Lead Channel Pacing Threshold Amplitude: 0.75 V
Lead Channel Pacing Threshold Pulse Width: 0.5 ms
Lead Channel Pacing Threshold Pulse Width: 0.5 ms
Lead Channel Pacing Threshold Pulse Width: 0.5 ms
Lead Channel Pacing Threshold Pulse Width: 0.5 ms
Lead Channel Sensing Intrinsic Amplitude: 12 mV
Lead Channel Sensing Intrinsic Amplitude: 2.9 mV
Lead Channel Setting Pacing Amplitude: 3.5 V
Lead Channel Setting Pacing Amplitude: 3.5 V
Lead Channel Setting Pacing Pulse Width: 0.5 ms
Lead Channel Setting Sensing Sensitivity: 2 mV
Pulse Gen Model: 2272
Pulse Gen Serial Number: 3963600

## 2021-02-26 LAB — CBC
Hematocrit: 30.4 % — ABNORMAL LOW (ref 34.0–46.6)
Hemoglobin: 10 g/dL — ABNORMAL LOW (ref 11.1–15.9)
MCH: 32.2 pg (ref 26.6–33.0)
MCHC: 32.9 g/dL (ref 31.5–35.7)
MCV: 98 fL — ABNORMAL HIGH (ref 79–97)
Platelets: 589 10*3/uL — ABNORMAL HIGH (ref 150–450)
RBC: 3.11 x10E6/uL — ABNORMAL LOW (ref 3.77–5.28)
RDW: 17.7 % — ABNORMAL HIGH (ref 11.7–15.4)
WBC: 16.5 10*3/uL — ABNORMAL HIGH (ref 3.4–10.8)

## 2021-02-26 MED ORDER — METOPROLOL SUCCINATE ER 50 MG PO TB24: 75.0000 mg | ORAL_TABLET | Freq: Every day | ORAL | 6 refills | Status: DC

## 2021-02-26 MED ORDER — APIXABAN 5 MG PO TABS: 5.0000 mg | ORAL_TABLET | Freq: Two times a day (BID) | ORAL | 3 refills | Status: DC

## 2021-02-26 NOTE — Patient Instructions (Signed)
Medication Instructions:  Your physician has recommended you make the following change in your medication:   START: Eliquis 5mg  twice daily  *If you need a refill on your cardiac medications before your next appointment, please call your pharmacy*   Lab Work: TODAY: BMET, CBC  If you have labs (blood work) drawn today and your tests are completely normal, you will receive your results only by: [place] (if you have MyChart) OR A paper copy in the mail If you have any lab test that is abnormal or we need to change your treatment, we will call you to review the results.   Follow-Up: At [facility], you and your health needs are our priority.  As part of our continuing mission to provide you with exceptional heart care, we have created designated Provider Care Teams.  These Care Teams include your primary Cardiologist (physician) and Advanced Practice Providers (APPs -  Physician Assistants and Nurse Practitioners) who all work together to provide you with the care you need, when you need it.  Your next appointment:   As scheduled

## 2021-02-27 ENCOUNTER — Telehealth: Payer: Self-pay | Admitting: Internal Medicine

## 2021-02-27 NOTE — Telephone Encounter (Signed)
New Message:      Patient would like to switch from Dr Rayann Heman to Dr Curt Bears in [place]. She says it much closer for her to go to [employer].

## 2021-03-02 NOTE — Telephone Encounter (Signed)
ok 

## 2021-03-03 ENCOUNTER — Telehealth: Payer: Self-pay | Admitting: Internal Medicine

## 2021-03-03 NOTE — Telephone Encounter (Signed)
   Pt is requesting to switch from Dr. Rayann Heman to Dr. Curt Bears, pt preferred to be seen at [facility] office to be closer to her

## 2021-03-05 ENCOUNTER — Other Ambulatory Visit: Payer: Self-pay

## 2021-03-05 ENCOUNTER — Ambulatory Visit (INDEPENDENT_AMBULATORY_CARE_PROVIDER_SITE_OTHER): Payer: Medicare Other

## 2021-03-05 DIAGNOSIS — I495 Sick sinus syndrome: Secondary | ICD-10-CM

## 2021-03-05 DIAGNOSIS — I428 Other cardiomyopathies: Secondary | ICD-10-CM | POA: Diagnosis not present

## 2021-03-05 LAB — CUP PACEART INCLINIC DEVICE CHECK
Battery Remaining Longevity: 136 mo
Battery Voltage: 3.11 V
Brady Statistic RA Percent Paced: 14 %
Brady Statistic RV Percent Paced: 0 %
Date Time Interrogation Session: 20221005163731
Implantable Lead Implant Date: 20220920
Implantable Lead Implant Date: 20220920
Implantable Lead Location: 753859
Implantable Lead Location: 753860
Implantable Pulse Generator Implant Date: 20220920
Lead Channel Impedance Value: 512.5 Ohm
Lead Channel Impedance Value: 637.5 Ohm
Lead Channel Pacing Threshold Amplitude: 0.5 V
Lead Channel Pacing Threshold Amplitude: 0.75 V
Lead Channel Pacing Threshold Pulse Width: 0.5 ms
Lead Channel Pacing Threshold Pulse Width: 0.5 ms
Lead Channel Sensing Intrinsic Amplitude: 12 mV
Lead Channel Sensing Intrinsic Amplitude: 3.9 mV
Lead Channel Setting Pacing Amplitude: 3.5 V
Lead Channel Setting Pacing Amplitude: 3.5 V
Lead Channel Setting Pacing Pulse Width: 0.5 ms
Lead Channel Setting Sensing Sensitivity: 2 mV
Pulse Gen Model: 2272
Pulse Gen Serial Number: 3963600

## 2021-03-05 NOTE — Telephone Encounter (Signed)
Okay per Dr. Rayann Heman.

## 2021-03-05 NOTE — Patient Instructions (Addendum)

## 2021-03-05 NOTE — Progress Notes (Signed)
Wound check appointment. Steri-strips removed. Wound without redness or edema. Incision edges approximated, wound well healed. Normal device function. Thresholds, sensing, and impedances consistent with implant measurements. Device programmed at 3.5V for extra safety margin until 3 month visit. Histogram distribution appropriate for patient and level of activity. No mode switches or high ventricular rates noted. Patient educated about wound care, arm mobility, lifting restrictions. Patient is enrolled in remote monitoring, next scheduled check 05/21/21.  Patient has requested to change MD due to location, next ROV is with Dr. Curt Bears in [facility] 06/09/21.     Patient concerned about episode that occurred last night, she reports she was awakened at 3am this morning, she felt a fullness in her chest, she was SOB and felt as though she might pass out.  The feeling lasted approximately a half hour.  She reports it was similar to the feeling she had which brought her to the hospital resulting in getting the pacemaker.  Patient did not have any AMS episodes or HVR episodes during this time.  She requested advice on what to do should this happen again.  I let her know I would forward this info to the doctor for recommendation.

## 2021-03-06 LAB — CBC WITH DIFFERENTIAL/PLATELET
Basophils Absolute: 0.1 10*3/uL (ref 0.0–0.2)
Basos: 1 %
EOS (ABSOLUTE): 0.2 10*3/uL (ref 0.0–0.4)
Eos: 1 %
Hematocrit: 28.4 % — ABNORMAL LOW (ref 34.0–46.6)
Hemoglobin: 9.8 g/dL — ABNORMAL LOW (ref 11.1–15.9)
Immature Grans (Abs): 0.2 10*3/uL — ABNORMAL HIGH (ref 0.0–0.1)
Immature Granulocytes: 1 %
Lymphocytes Absolute: 2.1 10*3/uL (ref 0.7–3.1)
Lymphs: 11 %
MCH: 32.9 pg (ref 26.6–33.0)
MCHC: 34.5 g/dL (ref 31.5–35.7)
MCV: 95 fL (ref 79–97)
Monocytes Absolute: 1.1 10*3/uL — ABNORMAL HIGH (ref 0.1–0.9)
Monocytes: 6 %
Neutrophils Absolute: 14.4 10*3/uL — ABNORMAL HIGH (ref 1.4–7.0)
Neutrophils: 80 %
Platelets: 252 10*3/uL (ref 150–450)
RBC: 2.98 x10E6/uL — ABNORMAL LOW (ref 3.77–5.28)
RDW: 18 % — ABNORMAL HIGH (ref 11.7–15.4)
WBC: 18.1 10*3/uL — ABNORMAL HIGH (ref 3.4–10.8)

## 2021-03-10 DIAGNOSIS — H6121 Impacted cerumen, right ear: Secondary | ICD-10-CM | POA: Diagnosis not present

## 2021-03-10 DIAGNOSIS — Z23 Encounter for immunization: Secondary | ICD-10-CM | POA: Diagnosis not present

## 2021-03-10 DIAGNOSIS — N390 Urinary tract infection, site not specified: Secondary | ICD-10-CM | POA: Diagnosis not present

## 2021-03-10 DIAGNOSIS — I480 Paroxysmal atrial fibrillation: Secondary | ICD-10-CM | POA: Diagnosis not present

## 2021-03-10 DIAGNOSIS — D72829 Elevated white blood cell count, unspecified: Secondary | ICD-10-CM | POA: Diagnosis not present

## 2021-03-10 DIAGNOSIS — R42 Dizziness and giddiness: Secondary | ICD-10-CM | POA: Diagnosis not present

## 2021-03-12 DIAGNOSIS — N390 Urinary tract infection, site not specified: Secondary | ICD-10-CM | POA: Diagnosis not present

## 2021-03-18 DIAGNOSIS — B9689 Other specified bacterial agents as the cause of diseases classified elsewhere: Secondary | ICD-10-CM | POA: Diagnosis not present

## 2021-03-18 DIAGNOSIS — J208 Acute bronchitis due to other specified organisms: Secondary | ICD-10-CM | POA: Diagnosis not present

## 2021-03-24 DIAGNOSIS — R0609 Other forms of dyspnea: Secondary | ICD-10-CM | POA: Diagnosis not present

## 2021-03-24 DIAGNOSIS — R0602 Shortness of breath: Secondary | ICD-10-CM | POA: Diagnosis not present

## 2021-03-24 DIAGNOSIS — I10 Essential (primary) hypertension: Secondary | ICD-10-CM | POA: Diagnosis not present

## 2021-03-24 DIAGNOSIS — Z950 Presence of cardiac pacemaker: Secondary | ICD-10-CM | POA: Diagnosis not present

## 2021-03-24 DIAGNOSIS — Z20822 Contact with and (suspected) exposure to covid-19: Secondary | ICD-10-CM | POA: Diagnosis not present

## 2021-03-24 DIAGNOSIS — J209 Acute bronchitis, unspecified: Secondary | ICD-10-CM | POA: Diagnosis not present

## 2021-03-24 DIAGNOSIS — J9811 Atelectasis: Secondary | ICD-10-CM | POA: Diagnosis not present

## 2021-03-24 DIAGNOSIS — R059 Cough, unspecified: Secondary | ICD-10-CM | POA: Diagnosis not present

## 2021-03-24 DIAGNOSIS — I517 Cardiomegaly: Secondary | ICD-10-CM | POA: Diagnosis not present

## 2021-03-24 DIAGNOSIS — Z79899 Other long term (current) drug therapy: Secondary | ICD-10-CM | POA: Diagnosis not present

## 2021-03-31 DIAGNOSIS — B9689 Other specified bacterial agents as the cause of diseases classified elsewhere: Secondary | ICD-10-CM | POA: Diagnosis not present

## 2021-03-31 DIAGNOSIS — D72829 Elevated white blood cell count, unspecified: Secondary | ICD-10-CM | POA: Diagnosis not present

## 2021-03-31 DIAGNOSIS — J208 Acute bronchitis due to other specified organisms: Secondary | ICD-10-CM | POA: Diagnosis not present

## 2021-04-03 ENCOUNTER — Other Ambulatory Visit (HOSPITAL_COMMUNITY): Payer: Self-pay

## 2021-04-04 ENCOUNTER — Telehealth: Payer: Self-pay | Admitting: Oncology

## 2021-04-04 NOTE — Telephone Encounter (Signed)
Pt called in to schedule appt from 11/3 referral. Scheduled appt, pt is aware of appt date and time.

## 2021-04-07 DIAGNOSIS — J209 Acute bronchitis, unspecified: Secondary | ICD-10-CM | POA: Diagnosis not present

## 2021-04-07 DIAGNOSIS — R051 Acute cough: Secondary | ICD-10-CM | POA: Diagnosis not present

## 2021-04-11 ENCOUNTER — Encounter: Payer: Self-pay | Admitting: *Deleted

## 2021-04-17 ENCOUNTER — Ambulatory Visit: Payer: Medicare Other | Admitting: Cardiology

## 2021-04-17 ENCOUNTER — Other Ambulatory Visit: Payer: Self-pay | Admitting: Oncology

## 2021-04-17 DIAGNOSIS — D72829 Elevated white blood cell count, unspecified: Secondary | ICD-10-CM

## 2021-04-17 NOTE — Progress Notes (Signed)
Alexandra Kim  [address] [place],  [place]  [address] [phone]  Clinic Day:  04/18/2021  Referring physician: [name], MD   HISTORY OF PRESENT ILLNESS:  The patient is a 86 y.o. female  who I was asked to consult upon for both leukocytosis and anemia.  Labs in October 2022 showed an elevated white count of 22.2, with a low hemoglobin of 9.8.   Furthermore, she had a left shift, which included the presence of metamyelocytes.  According to the patient, she was first told about having an elevated white count approximately 6 months ago.  She recalls having a bladder infection at that time.  However, her white count remained elevated after her bladder infection has dissipated.  Over these past few weeks, she has been dealing with bronchitis.  She claims she took prednisone approximately 2 weeks ago.  She denies having any fevers, drenching night sweats, unexplained weight loss, or lymphadenopathy which concerns her for leukocytosis potentially being related to an underlying hematologic malignancy.  She also denies having undergone a previous splenectomy.  As a pertains to her anemia, she denies having any overt forms of blood loss.  She did have an EGD 6 months ago, which did not reveal any abnormal upper GI tract pathology.  It has been over 5-6 years since she last had a colonoscopy.  She denies being placed on any new medications that have the ability to cause anemia via bone marrow suppression.  To her knowledge, there is no family history of any type of hematologic disorders.  PAST MEDICAL HISTORY:   Past Medical History:  Diagnosis Date   Chronic venous insufficiency 02/13/2019   Hyperlipidemia    Hypertension    Osteopenia    Varicose veins    Vitamin D deficiency     PAST SURGICAL HISTORY:   Past Surgical History:  Procedure Laterality Date   ABDOMINAL HYSTERECTOMY  1990   PACEMAKER IMPLANT N/A 02/18/2021   Procedure: PACEMAKER  IMPLANT;  Surgeon: Thompson Grayer, MD;  Location: [facility];  Service: Cardiovascular;  Laterality: N/A;    CURRENT MEDICATIONS:   Current Outpatient Medications  Medication Sig Dispense Refill   apixaban (ELIQUIS) 5 MG TABS tablet Take 1 tablet (5 mg total) by mouth 2 (two) times daily. 60 tablet 3   cholecalciferol (VITAMIN D) 1000 UNITS tablet Take 1,000 Units by mouth daily.     metoprolol succinate (TOPROL-XL) 50 MG 24 hr tablet Take 1.5 tablets (75 mg total) by mouth at bedtime. 45 tablet 6   multivitamin-lutein (OCUVITE-LUTEIN) CAPS capsule Take 1 capsule by mouth in the morning and at bedtime.     pantoprazole (PROTONIX) 40 MG tablet Take 40 mg by mouth 2 (two) times daily.     valsartan-hydrochlorothiazide (DIOVAN-HCT) 320-25 MG tablet Take 1 tablet by mouth daily.     No current facility-administered medications for this visit.    ALLERGIES:   Allergies  Allergen Reactions   Ace Inhibitors Cough    FAMILY HISTORY:   Family History  Problem Relation Age of Onset   Heart disease Mother    Diabetes Mother    Hypertension Mother    Lung cancer Father    Breast cancer Sister     SOCIAL HISTORY:  The patient was born and raised in [place].  She currently lives up [place] town by her self.  She is widowed, with 2 children, 2 grandchildren, and multiple great-grandchildren.  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  She denies a history of alcoholism or tobacco abuse.  REVIEW OF SYSTEMS:  Review of Systems  Constitutional:  Positive for fatigue. Negative for fever.  HENT:   Negative for hearing loss and sore throat.   Eyes:  Positive for eye problems.  Respiratory:  Positive for shortness of breath. Negative for chest tightness, cough and hemoptysis.   Cardiovascular:  Negative for chest pain and palpitations.  Gastrointestinal:  Negative for abdominal distention, abdominal pain, blood in stool, constipation, diarrhea, nausea and vomiting.  Endocrine:  Negative for hot flashes.  Genitourinary:  Negative for difficulty urinating, dysuria, frequency, hematuria and nocturia.   Musculoskeletal:  Positive for back pain. Negative for arthralgias, gait problem and myalgias.  Skin: Negative.  Negative for itching and rash.  Neurological: Negative.  Negative for dizziness, extremity weakness, gait problem, headaches, light-headedness and numbness.  Hematological: Negative.   Psychiatric/Behavioral: Negative.  Negative for depression and suicidal ideas. The patient is not nervous/anxious.     PHYSICAL EXAM:  Blood pressure 139/60, pulse 67, temperature 98.2 F (36.8 C), resp. rate 14, height 5' 3"  (1.6 m), weight 163 lb 3.2 oz (74 kg), SpO2 96 %. Wt Readings from Last 3 Encounters:  04/18/21 163 lb 3.2 oz (74 kg)  02/26/21 167 lb 12.8 oz (76.1 kg)  02/18/21 155 lb (70.3 kg)   Body mass index is 28.91 kg/m. Performance status (ECOG): 1 - Symptomatic but completely ambulatory Physical Exam Constitutional:      Appearance: Normal appearance. She is not ill-appearing.  HENT:     Mouth/Throat:     Mouth: Mucous membranes are moist.     Pharynx: Oropharynx is clear. No oropharyngeal exudate or posterior oropharyngeal erythema.  Cardiovascular:     Rate and Rhythm: Normal rate and regular rhythm.     Heart sounds: No murmur heard.   No friction rub. No gallop.  Pulmonary:     Effort: Pulmonary effort is normal. No respiratory distress.     Breath sounds: Normal breath sounds. No wheezing, rhonchi or rales.  Abdominal:     General: Bowel sounds are normal. There is no distension.     Palpations: Abdomen is soft. There is no mass.     Tenderness: There is no abdominal tenderness.  Musculoskeletal:        General: No swelling.     Right lower leg: No edema.     Left lower leg: No edema.  Lymphadenopathy:     Cervical: No cervical adenopathy.     Upper Body:     Right upper body: No supraclavicular or axillary adenopathy.     Left upper  body: No supraclavicular or axillary adenopathy.     Lower Body: No right inguinal adenopathy. No left inguinal adenopathy.  Skin:    General: Skin is warm.     Coloration: Skin is not jaundiced.     Findings: No lesion or rash.  Neurological:     General: No focal deficit present.     Mental Status: She is alert and oriented to person, place, and time. Mental status is at baseline.  Psychiatric:        Mood and Affect: Mood normal.        Behavior: Behavior normal.        Thought Content: Thought content normal.    LABS:    CBC Latest Ref Rng & Units 04/18/2021 03/05/2021 02/26/2021  WBC - 22.5 18.1(H) 16.5(H)  Hemoglobin 12.0 - 16.0 10.5(A) 9.8(L)  10.0(L)  Hematocrit 36 - 46 32(A) 28.4(L) 30.4(L)  Platelets 150 - 399 271 252 589(H)   CMP Latest Ref Rng & Units 02/26/2021 02/19/2021 02/18/2021  Glucose 70 - 99 mg/dL 88 110(H) 112(H)  BUN 8 - 27 mg/dL 16 17 19   Creatinine 0.57 - 1.00 mg/dL 0.94 1.02(H) 1.07(H)  Sodium 134 - 144 mmol/L 137 137 138  Potassium 3.5 - 5.2 mmol/L 4.7 3.8 3.8  Chloride 96 - 106 mmol/L 97 103 102  CO2 20 - 29 mmol/L 25 25 25   Calcium 8.7 - 10.3 mg/dL 9.4 8.8(L) 8.8(L)   ASSESSMENT & PLAN:  An 86 y.o. female who I was asked to consult upon for leukocytosis and anemia.  When looking at her peripheral smear, she does have a fair degree of teardrop red blood cells, as well as rouleaux formation.  Such findings are concerning for an infiltrative bone marrow process being present.  For completeness, I will check a serum protein electrophoresis to ensure a plasma cell dyscrasia, such as multiple myeloma, is not present.  Her peripheral smear was also pertinent for an increased number of white blood cells with a mild left shift.  I did see an occasional basophil.  Based upon this, I will test her for possible chronic myelogenous leukemia by having her blood FISH'd for the 9;22 translocation.  Iron, B12, and folate levels will also be checked to ensure there is no  underlying nutritional deficiency factoring into her anemia.  However, my main concern is there may be some type of infiltrative bone marrow process factoring into her abnormal peripheral counts.  I will see her back in 2 weeks to go over all of her labs collected today, as well as her implications.  The patient understands a bone marrow biopsy may ultimately be necessary to determine if any type of infiltrative marrow process is present.  The patient understands all the plans discussed today and is in agreement with them.  I do appreciate [name], MD for his new consult.   Shiree Altemus Macarthur Critchley, MD

## 2021-04-18 ENCOUNTER — Encounter: Payer: Self-pay | Admitting: Oncology

## 2021-04-18 ENCOUNTER — Inpatient Hospital Stay: Payer: Medicare Other | Attending: Oncology | Admitting: Oncology

## 2021-04-18 ENCOUNTER — Other Ambulatory Visit: Payer: Self-pay | Admitting: Oncology

## 2021-04-18 ENCOUNTER — Telehealth: Payer: Self-pay | Admitting: Oncology

## 2021-04-18 ENCOUNTER — Inpatient Hospital Stay: Payer: Medicare Other

## 2021-04-18 ENCOUNTER — Other Ambulatory Visit: Payer: Self-pay

## 2021-04-18 DIAGNOSIS — D539 Nutritional anemia, unspecified: Secondary | ICD-10-CM

## 2021-04-18 DIAGNOSIS — D72829 Elevated white blood cell count, unspecified: Secondary | ICD-10-CM

## 2021-04-18 DIAGNOSIS — D46Z Other myelodysplastic syndromes: Secondary | ICD-10-CM | POA: Insufficient documentation

## 2021-04-18 DIAGNOSIS — D649 Anemia, unspecified: Secondary | ICD-10-CM

## 2021-04-18 DIAGNOSIS — E538 Deficiency of other specified B group vitamins: Secondary | ICD-10-CM | POA: Diagnosis not present

## 2021-04-18 HISTORY — DX: Anemia, unspecified: D64.9

## 2021-04-18 HISTORY — DX: Elevated white blood cell count, unspecified: D72.829

## 2021-04-18 LAB — CBC AND DIFFERENTIAL
HCT: 32 — AB (ref 36–46)
Hemoglobin: 10.5 — AB (ref 12.0–16.0)
Neutrophils Absolute: 18.9
Platelets: 271 (ref 150–399)
WBC: 22.5

## 2021-04-18 LAB — CBC: RBC: 3.24 — AB (ref 3.87–5.11)

## 2021-04-18 NOTE — Telephone Encounter (Signed)
Per 11/18 los next appt scheduled and given to patient

## 2021-04-18 NOTE — Progress Notes (Unsigned)
Per [employer] web-page:  Notification or Prior Authorization is not required for the requested services  Decision ID #:[id number]  The number above acknowledges your inquiry and our response. Please write this number down and refer to it for future inquiries. Coverage and payment for an item or service is governed by the member's benefit plan document, and, if applicable, the provider's participation agreement with the Health Plan.

## 2021-04-23 NOTE — Progress Notes (Incomplete)
[place]  [address] [place],  [place]  [address] [phone]  Clinic Day:  04/23/2021  Referring physician: [name], MD  This document serves as a record of services personally performed by Dequincy Macarthur Critchley, MD. It was created on their behalf by [facility] E, a trained medical scribe. The creation of this record is based on the scribe's personal observations and the provider's statements to them.  HISTORY OF PRESENT ILLNESS:  The patient is a 86 y.o. female  who I recently began seeing for both leukocytosis and anemia.  Labs in October 2022 showed an elevated white count of 22.2, with a low hemoglobin of 9.8.   Furthermore, she had a left shift, which included the presence of metamyelocytes.  According to the patient, she was first told about having an elevated white count approximately 6 months ago.  She recalls having a bladder infection at that time.  However, her white count remained elevated after her bladder infection has dissipated.  Over these past few weeks, she has been dealing with bronchitis.  She claims she took prednisone approximately 2 weeks ago.  She denies having any fevers, drenching night sweats, unexplained weight loss, or lymphadenopathy which concerns her for leukocytosis potentially being related to an underlying hematologic malignancy.  She also denies having undergone a previous splenectomy.  As a pertains to her anemia, she denies having any overt forms of blood loss.  She did have an EGD 6 months ago, which did not reveal any abnormal upper GI tract pathology.  It has been over 5-6 years since she last had a colonoscopy.  She denies being placed on any new medications that have the ability to cause anemia via bone marrow suppression.  To her knowledge, there is no family history of any type of hematologic disorders.  PHYSICAL EXAM:  There were no vitals taken for this visit. Wt Readings from Last 3 Encounters:   04/18/21 163 lb 3.2 oz (74 kg)  02/26/21 167 lb 12.8 oz (76.1 kg)  02/18/21 155 lb (70.3 kg)   There is no height or weight on file to calculate BMI. Performance status (ECOG): 1 - Symptomatic but completely ambulatory Physical Exam Constitutional:      Appearance: Normal appearance. She is not ill-appearing.  HENT:     Mouth/Throat:     Mouth: Mucous membranes are moist.     Pharynx: Oropharynx is clear. No oropharyngeal exudate or posterior oropharyngeal erythema.  Cardiovascular:     Rate and Rhythm: Normal rate and regular rhythm.     Heart sounds: No murmur heard.   No friction rub. No gallop.  Pulmonary:     Effort: Pulmonary effort is normal. No respiratory distress.     Breath sounds: Normal breath sounds. No wheezing, rhonchi or rales.  Abdominal:     General: Bowel sounds are normal. There is no distension.     Palpations: Abdomen is soft. There is no mass.     Tenderness: There is no abdominal tenderness.  Musculoskeletal:        General: No swelling.     Right lower leg: No edema.     Left lower leg: No edema.  Lymphadenopathy:     Cervical: No cervical adenopathy.     Upper Body:     Right upper body: No supraclavicular or axillary adenopathy.     Left upper body: No supraclavicular or axillary adenopathy.     Lower Body: No right inguinal adenopathy. No  left inguinal adenopathy.  Skin:    General: Skin is warm.     Coloration: Skin is not jaundiced.     Findings: No lesion or rash.  Neurological:     General: No focal deficit present.     Mental Status: She is alert and oriented to person, place, and time. Mental status is at baseline.  Psychiatric:        Mood and Affect: Mood normal.        Behavior: Behavior normal.        Thought Content: Thought content normal.    LABS:    CBC Latest Ref Rng & Units 04/18/2021 03/05/2021 02/26/2021  WBC - 22.5 18.1(H) 16.5(H)  Hemoglobin 12.0 - 16.0 10.5(A) 9.8(L) 10.0(L)  Hematocrit 36 - 46 32(A) 28.4(L)  30.4(L)  Platelets 150 - 399 271 252 589(H)   CMP Latest Ref Rng & Units 02/26/2021 02/19/2021 02/18/2021  Glucose 70 - 99 mg/dL 88 110(H) 112(H)  BUN 8 - 27 mg/dL _0 Creatinine 0.57 - 1.00 mg/dL 0.94 1.02(H) 1.07(H)  Sodium 134 - 144 mmol/L 137 137 138  Potassium 3.5 - 5.2 mmol/L 4.7 3.8 3.8  Chloride 96 - 106 mmol/L 97 103 102  CO2 20 - 29 mmol/L _1 Calcium 8.7 - 10.3 mg/dL 9.4 8.8(L) 8.8(L)   ASSESSMENT & PLAN:  An 86 y.o. female who I was asked to consult upon for leukocytosis and anemia.  When looking at her peripheral smear, she does have a fair degree of teardrop red blood cells, as well as rouleaux formation.  Such findings are concerning for an infiltrative bone marrow process being present.  For completeness, I will check a serum protein electrophoresis to ensure a plasma cell dyscrasia, such as multiple myeloma, is not present.  Her peripheral smear was also pertinent for an increased number of white blood cells with a mild left shift.  I did see an occasional basophil.  Based upon this, I will test her for possible chronic myelogenous leukemia by having her blood FISH'd for the 9;22 translocation.  Iron, B12, and folate levels will also be checked to ensure there is no underlying nutritional deficiency factoring into her anemia.  However, my main concern is there may be some type of infiltrative bone marrow process factoring into her abnormal peripheral counts.  I will see her back in 2 weeks to go over all of her labs collected today, as well as her implications.  The patient understands a bone marrow biopsy may ultimately be necessary to determine if any type of infiltrative marrow process is present.  The patient understands all the plans discussed today and is in agreement with them.   I, Rita Ohara, am acting as scribe for Marice Potter, MD    I have reviewed this report as typed by the medical scribe, and it is complete and accurate.  Dequincy Macarthur Critchley,  MD

## 2021-04-25 LAB — BCR-ABL1 FISH
Cells Analyzed: 200
Cells Counted: 200

## 2021-04-28 ENCOUNTER — Encounter: Payer: Self-pay | Admitting: Oncology

## 2021-04-30 ENCOUNTER — Telehealth: Payer: Self-pay | Admitting: Cardiology

## 2021-04-30 ENCOUNTER — Encounter: Payer: Self-pay | Admitting: Oncology

## 2021-04-30 DIAGNOSIS — E785 Hyperlipidemia, unspecified: Secondary | ICD-10-CM | POA: Diagnosis not present

## 2021-04-30 DIAGNOSIS — I10 Essential (primary) hypertension: Secondary | ICD-10-CM | POA: Diagnosis not present

## 2021-04-30 NOTE — Telephone Encounter (Signed)
  STAT if patient feels like he/she is going to faint   Are you dizzy now? yes  Do you feel faint or have you passed out? Just feels faint   Do you have any other symptoms? Weak. Patient feels like she could pass out   Have you checked your HR and BP (record if available)? No  Pt c/o swelling: STAT is pt has developed SOB within 24 hours  If swelling, where is the swelling located? Left hand  How much weight have you gained and in what time span? Not sure. Patient does not weigh herself  Have you gained 3 pounds in a day or 5 pounds in a week?   Do you have a log of your daily weights (if so, list)?   Are you currently taking a fluid pill? no  Are you currently SOB?   Have you traveled recently?

## 2021-04-30 NOTE — Telephone Encounter (Signed)
Spoke with pt who complains of SOB, dizziness and left hand swelling upon waking.  Pt states she is not sure if she is in Afib or not.  She has not taken BP today nor has she eaten anything but has taken her morning medications.  Pt denies current CP or fainting.  She is currently sitting in chair with her feet up.  Asked pt if she could safely send a remote transmission and assisted pt with doing this.   Pt advised will forward information to device RN to review transmission.  Reviewed ED precautions.  Advised pt to eat and drink something and rest until her daughter arrives and have her daughter check BP and HR.  Pt verbalizes understanding and agrees with current plan.

## 2021-04-30 NOTE — Telephone Encounter (Signed)
Transmission did not come through. Successful telephone call to patient. Daughter present. Reports BP as 137/71, 144/69, 146/71. Assisted daughter and patient with sending manual transmission. Normal device function. Hx of AF. +Eliquis. No AT/AF episodes since. Daughter concerned with patient symptoms of dizzinessnes, weakness, and edematous left hand (see pic below). Discussed with Dr. Rayann Heman who reviewed pic. Patient/daughter instructed to keep left arm elevated and utilize as tolerated to promote circulation. No concern for thrombus as patient is on [place]. Patient has history of vertigo. Daughter encouraged to contact PCP for ongoing assessment of dizziness as it does not appear it is related to BP or HR. ED precautions reinforced.

## 2021-05-01 NOTE — Progress Notes (Signed)
[place]  [address] [place],  [place]  [address] [phone]  Clinic Day:  05/02/2021  Referring physician: [name], MD  This document serves as a record of services personally performed by Lari Linson Macarthur Critchley, MD. It was created on their behalf by [facility] E, a trained medical scribe. The creation of this record is based on the scribe's personal observations and the provider's statements to them.  HISTORY OF PRESENT ILLNESS:  The patient is a 86 y.o. female  who I recently began seeing for both leukocytosis and anemia.  She comes in today to reassess her peripheral counts.  Of note, since her last visit, the patient has complained of swelling in her left arm.  She attributes a lot of this to the pacemaker that was placed 3 months ago.  Although it is better today, the patient claims her left arm has been more swollen in the past.  She continues to deny having any B symptoms or lymphadenopathy which concerns her for her leukocytosis being due to her underlying hematologic malignancy.  She also denies having any overt forms of blood loss to explain her anemia.  PHYSICAL EXAM:  Blood pressure (!) 160/67, pulse 72, temperature 97.6 F (36.4 C), resp. rate 17, height _0  (1.6 m), weight 157 lb (71.2 kg), SpO2 95 %. Wt Readings from Last 3 Encounters:  05/02/21 157 lb (71.2 kg)  04/18/21 163 lb 3.2 oz (74 kg)  02/26/21 167 lb 12.8 oz (76.1 kg)   Body mass index is 27.81 kg/m. Performance status (ECOG): 1 - Symptomatic but completely ambulatory Physical Exam Constitutional:      Appearance: Normal appearance. She is not ill-appearing.  HENT:     Mouth/Throat:     Mouth: Mucous membranes are moist.     Pharynx: Oropharynx is clear. No oropharyngeal exudate or posterior oropharyngeal erythema.  Cardiovascular:     Rate and Rhythm: Normal rate and regular rhythm.     Heart sounds: No murmur heard.   No friction rub. No gallop.   Pulmonary:     Effort: Pulmonary effort is normal. No respiratory distress.     Breath sounds: Normal breath sounds. No wheezing, rhonchi or rales.  Abdominal:     General: Bowel sounds are normal. There is no distension.     Palpations: Abdomen is soft. There is no mass.     Tenderness: There is no abdominal tenderness.  Musculoskeletal:        General: No swelling.     Right lower leg: No edema.     Left lower leg: No edema.  Lymphadenopathy:     Cervical: No cervical adenopathy.     Upper Body:     Right upper body: No supraclavicular or axillary adenopathy.     Left upper body: No supraclavicular or axillary adenopathy.     Lower Body: No right inguinal adenopathy. No left inguinal adenopathy.  Skin:    General: Skin is warm.     Coloration: Skin is not jaundiced.     Findings: No lesion or rash.  Neurological:     General: No focal deficit present.     Mental Status: She is alert and oriented to person, place, and time. Mental status is at baseline.  Psychiatric:        Mood and Affect: Mood normal.        Behavior: Behavior normal.        Thought Content: Thought content normal.  LABS:    CBC Latest Ref Rng & Units 05/02/2021 04/18/2021 03/05/2021  WBC - 19.2 22.5 18.1(H)  Hemoglobin 12.0 - 16.0 10.2(A) 10.5(A) 9.8(L)  Hematocrit 36 - 46 31(A) 32(A) 28.4(L)  Platelets 150 - 399 291 271 252   CMP Latest Ref Rng & Units 02/26/2021 02/19/2021 02/18/2021  Glucose 70 - 99 mg/dL 88 110(H) 112(H)  BUN 8 - 27 mg/dL _0 Creatinine 0.57 - 1.00 mg/dL 0.94 1.02(H) 1.07(H)  Sodium 134 - 144 mmol/L 137 137 138  Potassium 3.5 - 5.2 mmol/L 4.7 3.8 3.8  Chloride 96 - 106 mmol/L 97 103 102  CO2 20 - 29 mmol/L _1 Calcium 8.7 - 10.3 mg/dL 9.4 8.8(L) 8.8(L)      Latest Reference Range & Units 05/02/21 13:33  Iron 28 - 170 ug/dL 63  UIBC ug/dL 230  TIBC 250 - 450 ug/dL 293  Saturation Ratios 10.4 - 31.8 % 22  Ferritin 11 - 307 ng/mL 176  Folate >5.9 ng/mL 6.3   Vitamin B12 180 - 914 pg/mL 491    ASSESSMENT & PLAN:  An 86 y.o. female with leukocytosis and anemia.  Her white count is slightly better today.  Her leukocytosis could be due to the swelling she has had recently in her left arm.  Of note, she was tested for [facility], whose results came back negative.  Her leukocytosis will continue to be followed conservatively.  With respect to her anemia, her hemoglobin remains above 10.  Her labs today continue to show no evidence of nutritional deficiency being present.  I will check a serum protein electrophoresis to ensure her anemia is not due to an underlying plasma cell dyscrasia.  As she is clinically doing okay, her anemia will continue to be followed conservatively.  I will see this patient back in 1 month for repeat clinical assessment, primarily to ensure her peripheral counts are not changing to where a bone marrow biopsy may need to be considered.  The patient understands all the plans discussed today and is in agreement with them.  I, Rita Ohara, am acting as scribe for Marice Potter, MD    I have reviewed this report as typed by the medical scribe, and it is complete and accurate.  Mystery Schrupp Macarthur Critchley, MD

## 2021-05-02 ENCOUNTER — Other Ambulatory Visit: Payer: Medicare Other

## 2021-05-02 ENCOUNTER — Inpatient Hospital Stay (INDEPENDENT_AMBULATORY_CARE_PROVIDER_SITE_OTHER): Payer: Medicare Other | Admitting: Oncology

## 2021-05-02 ENCOUNTER — Other Ambulatory Visit: Payer: Self-pay

## 2021-05-02 ENCOUNTER — Inpatient Hospital Stay: Payer: Medicare Other | Attending: Oncology

## 2021-05-02 ENCOUNTER — Encounter: Payer: Self-pay | Admitting: Oncology

## 2021-05-02 ENCOUNTER — Ambulatory Visit: Payer: Medicare Other | Admitting: Oncology

## 2021-05-02 ENCOUNTER — Other Ambulatory Visit: Payer: Self-pay | Admitting: Oncology

## 2021-05-02 VITALS — BP 160/67 | HR 72 | Temp 97.6°F | Resp 17 | Ht 63.0 in | Wt 157.0 lb

## 2021-05-02 DIAGNOSIS — D649 Anemia, unspecified: Secondary | ICD-10-CM | POA: Diagnosis not present

## 2021-05-02 DIAGNOSIS — D472 Monoclonal gammopathy: Secondary | ICD-10-CM

## 2021-05-02 DIAGNOSIS — D72829 Elevated white blood cell count, unspecified: Secondary | ICD-10-CM

## 2021-05-02 DIAGNOSIS — D539 Nutritional anemia, unspecified: Secondary | ICD-10-CM

## 2021-05-02 DIAGNOSIS — M7989 Other specified soft tissue disorders: Secondary | ICD-10-CM | POA: Diagnosis not present

## 2021-05-02 DIAGNOSIS — Z951 Presence of aortocoronary bypass graft: Secondary | ICD-10-CM | POA: Diagnosis not present

## 2021-05-02 LAB — IRON AND TIBC
Iron: 63 ug/dL (ref 28–170)
Saturation Ratios: 22 % (ref 10.4–31.8)
TIBC: 293 ug/dL (ref 250–450)
UIBC: 230 ug/dL

## 2021-05-02 LAB — FOLATE: Folate: 6.3 ng/mL (ref >5.9)

## 2021-05-02 LAB — CBC: RBC: 3.15 — AB (ref 3.87–5.11)

## 2021-05-02 LAB — CBC AND DIFFERENTIAL
HCT: 31 — AB (ref 36–46)
Hemoglobin: 10.2 — AB (ref 12.0–16.0)
Neutrophils Absolute: 15.74
Platelets: 291 (ref 150–399)
WBC: 19.2

## 2021-05-02 LAB — LACTATE DEHYDROGENASE: LDH: 232 U/L — ABNORMAL HIGH (ref 98–192)

## 2021-05-02 LAB — VITAMIN B12: Vitamin B-12: 491 pg/mL (ref 180–914)

## 2021-05-02 LAB — FERRITIN: Ferritin: 176 ng/mL (ref 11–307)

## 2021-05-03 ENCOUNTER — Encounter (HOSPITAL_COMMUNITY): Payer: Self-pay | Admitting: *Deleted

## 2021-05-03 ENCOUNTER — Other Ambulatory Visit: Payer: Self-pay

## 2021-05-03 ENCOUNTER — Emergency Department (HOSPITAL_COMMUNITY): Payer: Medicare Other

## 2021-05-03 ENCOUNTER — Observation Stay (HOSPITAL_COMMUNITY)
Admission: EM | Admit: 2021-05-03 | Discharge: 2021-05-05 | Disposition: A | Discharge status: Home / Self Care | Payer: Medicare Other | Attending: Internal Medicine | Admitting: Internal Medicine

## 2021-05-03 ENCOUNTER — Emergency Department (HOSPITAL_BASED_OUTPATIENT_CLINIC_OR_DEPARTMENT_OTHER): Payer: Medicare Other

## 2021-05-03 DIAGNOSIS — I10 Essential (primary) hypertension: Secondary | ICD-10-CM | POA: Diagnosis present

## 2021-05-03 DIAGNOSIS — I82622 Acute embolism and thrombosis of deep veins of left upper extremity: Principal | ICD-10-CM | POA: Diagnosis present

## 2021-05-03 DIAGNOSIS — Z20822 Contact with and (suspected) exposure to covid-19: Secondary | ICD-10-CM | POA: Diagnosis not present

## 2021-05-03 DIAGNOSIS — M7989 Other specified soft tissue disorders: Secondary | ICD-10-CM

## 2021-05-03 DIAGNOSIS — Z87891 Personal history of nicotine dependence: Secondary | ICD-10-CM | POA: Insufficient documentation

## 2021-05-03 DIAGNOSIS — R0602 Shortness of breath: Secondary | ICD-10-CM

## 2021-05-03 DIAGNOSIS — Z79899 Other long term (current) drug therapy: Secondary | ICD-10-CM | POA: Insufficient documentation

## 2021-05-03 DIAGNOSIS — K219 Gastro-esophageal reflux disease without esophagitis: Secondary | ICD-10-CM | POA: Diagnosis present

## 2021-05-03 DIAGNOSIS — Z7901 Long term (current) use of anticoagulants: Secondary | ICD-10-CM | POA: Insufficient documentation

## 2021-05-03 DIAGNOSIS — D72829 Elevated white blood cell count, unspecified: Secondary | ICD-10-CM | POA: Diagnosis present

## 2021-05-03 DIAGNOSIS — M79602 Pain in left arm: Secondary | ICD-10-CM | POA: Diagnosis not present

## 2021-05-03 DIAGNOSIS — I480 Paroxysmal atrial fibrillation: Secondary | ICD-10-CM | POA: Diagnosis present

## 2021-05-03 DIAGNOSIS — R079 Chest pain, unspecified: Secondary | ICD-10-CM | POA: Diagnosis not present

## 2021-05-03 LAB — URINALYSIS, ROUTINE W REFLEX MICROSCOPIC
Bilirubin Urine: NEGATIVE
Glucose, UA: NEGATIVE mg/dL
Hgb urine dipstick: NEGATIVE
Ketones, ur: NEGATIVE mg/dL
Leukocytes,Ua: NEGATIVE
Nitrite: NEGATIVE
Protein, ur: NEGATIVE mg/dL
Specific Gravity, Urine: 1.02 (ref 1.005–1.030)
pH: 5 (ref 5.0–8.0)

## 2021-05-03 LAB — CBC
HCT: 29.2 % — ABNORMAL LOW (ref 36.0–46.0)
Hemoglobin: 9.6 g/dL — ABNORMAL LOW (ref 12.0–15.0)
MCH: 33.6 pg (ref 26.0–34.0)
MCHC: 32.9 g/dL (ref 30.0–36.0)
MCV: 102.1 fL — ABNORMAL HIGH (ref 80.0–100.0)
Platelets: 286 10*3/uL (ref 150–400)
RBC: 2.86 MIL/uL — ABNORMAL LOW (ref 3.87–5.11)
RDW: 21.2 % — ABNORMAL HIGH (ref 11.5–15.5)
WBC: 18.6 10*3/uL — ABNORMAL HIGH (ref 4.0–10.5)
nRBC: 0.1 % (ref 0.0–0.2)

## 2021-05-03 LAB — BASIC METABOLIC PANEL
Anion gap: 8 (ref 5–15)
BUN: 24 mg/dL — ABNORMAL HIGH (ref 8–23)
CO2: 27 mmol/L (ref 22–32)
Calcium: 9.4 mg/dL (ref 8.9–10.3)
Chloride: 102 mmol/L (ref 98–111)
Creatinine, Ser: 1.09 mg/dL — ABNORMAL HIGH (ref 0.44–1.00)
GFR, Estimated: 50 mL/min — ABNORMAL LOW (ref >60)
Glucose, Bld: 127 mg/dL — ABNORMAL HIGH (ref 70–99)
Potassium: 3.7 mmol/L (ref 3.5–5.1)
Sodium: 137 mmol/L (ref 135–145)

## 2021-05-03 LAB — RESP PANEL BY RT-PCR (FLU A&B, COVID) ARPGX2
Influenza A by PCR: NEGATIVE
Influenza B by PCR: NEGATIVE
SARS Coronavirus 2 by RT PCR: NEGATIVE

## 2021-05-03 LAB — TROPONIN I (HIGH SENSITIVITY)
Troponin I (High Sensitivity): 8 ng/L (ref <18)
Troponin I (High Sensitivity): 5 ng/L (ref <18)

## 2021-05-03 LAB — PROTIME-INR
INR: 1.6 — ABNORMAL HIGH (ref 0.8–1.2)
Prothrombin Time: 18.8 seconds — ABNORMAL HIGH (ref 11.4–15.2)

## 2021-05-03 LAB — APTT: aPTT: 39 seconds — ABNORMAL HIGH (ref 24–36)

## 2021-05-03 LAB — HEPARIN LEVEL (UNFRACTIONATED): Heparin Unfractionated: >1.1 IU/mL — ABNORMAL HIGH (ref 0.30–0.70)

## 2021-05-03 MED ORDER — HEPARIN (PORCINE) 25000 UT/250ML-% IV SOLN
1200.0000 [IU]/h | INTRAVENOUS | Status: DC
  Administered 2021-05-03: 1200 [IU]/h via INTRAVENOUS
  Filled 2021-05-03: qty 250

## 2021-05-03 NOTE — ED Notes (Signed)
Daughter Anise Salvo [phone] wants an update

## 2021-05-03 NOTE — Progress Notes (Signed)
VASCULAR LAB    Left upper extremity venous duplex has been performed.  See CV proc for preliminary results.  Gave verbal report to [employer], RN  Raveen Wieseler, RVT 05/03/2021, 6:12 PM

## 2021-05-03 NOTE — ED Provider Notes (Signed)
Emergency Medicine Provider Triage Evaluation Note  Alexandra Kim , a 86 y.o. female  was evaluated in triage.  States that she had a pacemaker placed at the end of September.  Ever since then she has had intermittent chest pain.  She also said that she has been feeling sick and fatigued.  She has been feeling like she is going to pass out multiple times during the day since September.  She also noticed for the past 2 to 3 days that she has left arm swelling.  She denies any numbness and tingling to this arm.  She is not actively having chest pain.  Denies any shortness of breath or fevers  Review of Systems  Positive: See above Negative:   Physical Exam  BP 110/60 (BP Location: Right Arm)   Pulse 68   Temp 98.2 F (36.8 C)   Resp 17   Ht 5\' 3"  (1.6 m)   Wt 71.3 kg   SpO2 96%   BMI 27.84 kg/m  Gen:   Awake, no distress  Resp:  Normal effort  MSK:   Moves extremities without difficulty  Other:  Notable swelling of left upper extremity. Normal pulses. Good sensation.   Medical Decision Making  Medically screening exam initiated at 5:21 PM.  Appropriate orders placed.  Alexandra Kim was informed that the remainder of the evaluation will be completed by another provider, this initial triage assessment does not replace that evaluation, and the importance of remaining in the ED until their evaluation is complete.     Sheila Oats 05/03/21 1722    Carmin Muskrat, MD 05/03/21 [phone]

## 2021-05-03 NOTE — ED Provider Notes (Signed)
[facility] DEPARTMENT Provider Note   CSN: [id number] Arrival date & time: 05/03/21  1616     History Chief Complaint  Patient presents with   feels sick    Alexandra Kim is a 86 y.o. female.  The history is provided by the patient and medical records.  Illness Location:  L arm Quality:  Swelling Severity:  Moderate Onset quality:  Gradual Duration:  3 days Timing:  Constant Progression:  Unchanged Chronicity:  New Context:  PPM placed 3 months ago, on eliquis Relieved by:  None Associated symptoms: shortness of breath   Associated symptoms: no abdominal pain, no chest pain, no cough, no ear pain, no fever, no rash, no sore throat and no vomiting   Shortness of breath:    Severity:  Moderate   Onset quality:  Gradual   Duration:  2 weeks   Progression:  Unchanged     Past Medical History:  Diagnosis Date   Chronic venous insufficiency 02/13/2019   Hyperlipidemia    Hypertension    Osteopenia    Varicose veins    Vitamin D deficiency     Patient Active Problem List   Diagnosis Date Noted   Leukocytosis 04/18/2021   Anemia 04/18/2021   A-fib ([place]) 02/18/2021   Tachy-brady syndrome ([place]) 02/18/2021   Pre-syncope 02/18/2021   Chronic venous insufficiency 02/13/2019   Hypertension 08/30/2018   Hyperlipidemia 08/30/2018   GERD (gastroesophageal reflux disease) 08/30/2018   Chest pain in adult 08/30/2018   Lumbar radiculopathy 08/30/2018   Displacement of lumbar intervertebral disc without myelopathy 08/30/2018   Osteopenia 08/30/2018   Closed displaced fracture of head of right radius 11/30/2016    Past Surgical History:  Procedure Laterality Date   ABDOMINAL HYSTERECTOMY  1990   PACEMAKER IMPLANT N/A 02/18/2021   Procedure: PACEMAKER IMPLANT;  Surgeon: Thompson Grayer, MD;  Location: [facility];  Service: Cardiovascular;  Laterality: N/A;     OB History   No obstetric history on file.     Family History   Problem Relation Age of Onset   Heart disease Mother    Diabetes Mother    Hypertension Mother    Lung cancer Father    Breast cancer Sister     Social History   Tobacco Use   Smoking status: Former    Types: Cigarettes    Quit date: 01/13/2007    Years since quitting: 14.3   Smokeless tobacco: Never  Vaping Use   Vaping Use: Never used  Substance Use Topics   Alcohol use: No   Drug use: Never    Home Medications Prior to Admission medications   Medication Sig Start Date End Date Taking? Authorizing Provider  apixaban (ELIQUIS) 5 MG TABS tablet Take 1 tablet (5 mg total) by mouth 2 (two) times daily. 02/26/21   Shirley Friar, PA-C  cholecalciferol (VITAMIN D) 1000 UNITS tablet Take 1,000 Units by mouth daily.    [provider]  metoprolol succinate (TOPROL-XL) 50 MG 24 hr tablet Take 1.5 tablets (75 mg total) by mouth at bedtime. 02/26/21   Shirley Friar, PA-C  multivitamin-lutein (OCUVITE-LUTEIN) CAPS capsule Take 1 capsule by mouth in the morning and at bedtime.    [provider]  pantoprazole (PROTONIX) 40 MG tablet Take 40 mg by mouth 2 (two) times daily. 07/04/18   [provider]  valsartan-hydrochlorothiazide (DIOVAN-HCT) 320-25 MG tablet Take 1 tablet by mouth daily. 08/15/18   [provider]  Allergies    Ace inhibitors  Review of Systems   Review of Systems  Constitutional:  Negative for chills and fever.  HENT:  Negative for ear pain and sore throat.   Eyes:  Negative for pain and visual disturbance.  Respiratory:  Positive for shortness of breath. Negative for cough.   Cardiovascular:  Negative for chest pain and palpitations.  Gastrointestinal:  Negative for abdominal pain and vomiting.  Genitourinary:  Negative for dysuria and hematuria.  Musculoskeletal:  Negative for arthralgias and back pain.       L arm swelling  Skin:  Negative for color change and rash.  Neurological:  Negative for seizures  and syncope.  All other systems reviewed and are negative.  Physical Exam Updated Vital Signs BP 126/67   Pulse 63   Temp 98.2 F (36.8 C)   Resp 19   Ht 5\' 3"  (1.6 m)   Wt 71.3 kg   SpO2 94%   BMI 27.84 kg/m   Physical Exam Vitals and nursing note reviewed.  Constitutional:      General: She is not in acute distress.    Appearance: Normal appearance. She is well-developed.  HENT:     Head: Normocephalic and atraumatic.     Right Ear: External ear normal.     Left Ear: External ear normal.     Nose: Nose normal. No congestion or rhinorrhea.     Mouth/Throat:     Mouth: Mucous membranes are moist.     Pharynx: Oropharynx is clear.  Eyes:     Extraocular Movements: Extraocular movements intact.     Conjunctiva/sclera: Conjunctivae normal.     Pupils: Pupils are equal, round, and reactive to light.  Cardiovascular:     Rate and Rhythm: Regular rhythm.     Pulses: Normal pulses.     Heart sounds: No murmur heard. Pulmonary:     Effort: Pulmonary effort is normal. No respiratory distress.     Breath sounds: Normal breath sounds. No wheezing, rhonchi or rales.     Comments: She is not tachypneic or requiring oxygen Chest:     Chest wall: No tenderness.  Abdominal:     General: Abdomen is flat. Bowel sounds are normal.     Palpations: Abdomen is soft.     Tenderness: There is no abdominal tenderness. There is no guarding or rebound.  Musculoskeletal:        General: Swelling (Diffuse left upper extremity swelling) present. No tenderness or deformity.     Cervical back: Normal range of motion and neck supple. No rigidity.     Right lower leg: No edema.     Left lower leg: No edema.  Skin:    General: Skin is warm and dry.     Capillary Refill: Capillary refill takes less than 2 seconds.  Neurological:     General: No focal deficit present.     Mental Status: She is alert and oriented to person, place, and time.     Cranial Nerves: No cranial nerve deficit.      Sensory: No sensory deficit.     Motor: No weakness.     Coordination: Coordination normal.     Gait: Gait normal.  Psychiatric:        Mood and Affect: Mood normal.    ED Results / Procedures / Treatments   Labs (all labs ordered are listed, but only abnormal results are displayed) Labs Reviewed  BASIC METABOLIC PANEL - Abnormal; Notable for the following  components:      Result Value   Glucose, Bld 127 (*)    BUN 24 (*)    Creatinine, Ser 1.09 (*)    GFR, Estimated 50 (*)    All other components within normal limits  CBC - Abnormal; Notable for the following components:   WBC 18.6 (*)    RBC 2.86 (*)    Hemoglobin 9.6 (*)    HCT 29.2 (*)    MCV 102.1 (*)    RDW 21.2 (*)    All other components within normal limits  PROTIME-INR - Abnormal; Notable for the following components:   Prothrombin Time 18.8 (*)    INR 1.6 (*)    All other components within normal limits  HEPARIN LEVEL (UNFRACTIONATED) - Abnormal; Notable for the following components:   Heparin Unfractionated >1.10 (*)    All other components within normal limits  APTT - Abnormal; Notable for the following components:   aPTT 39 (*)    All other components within normal limits  RESP PANEL BY RT-PCR (FLU A&B, COVID) ARPGX2  URINALYSIS, ROUTINE W REFLEX MICROSCOPIC  HEPARIN LEVEL (UNFRACTIONATED)  APTT  TROPONIN I (HIGH SENSITIVITY)  TROPONIN I (HIGH SENSITIVITY)    EKG EKG Interpretation  Date/Time:  Saturday May 03 2021 18:47:38 EST Ventricular Rate:  68 PR Interval:  155 QRS Duration: 95 QT Interval:  431 QTC Calculation: 459 R Axis:   -64 Text Interpretation: Sinus rhythm Probable left atrial enlargement Left anterior fascicular block Abnormal R-wave progression, early transition Left ventricular hypertrophy Anterior Q waves, possibly due to LVH When compared to prior, similar appearance. NO STEMI Confirmed by Antony Blackbird [phone]) on 05/03/2021 9:36:48 PM  Radiology DG Chest 2 View  Result  Date: 05/03/2021 CLINICAL DATA:  Chest pain. EXAM: CHEST - 2 VIEW COMPARISON:  March 24, 2021 FINDINGS: Cardiac pacemaker in stable position. Cardiomediastinal silhouette is normal. Mediastinal contours appear intact. There is no evidence of focal airspace consolidation, pleural effusion or pneumothorax. Minimal atelectasis versus scarring in the lung bases. Osseous structures are without acute abnormality. Soft tissues are grossly normal. IMPRESSION: No active cardiopulmonary disease. Electronically Signed   By: Fidela Salisbury M.D.   On: 05/03/2021 17:52   UE VENOUS DUPLEX (7am - 7pm)  Result Date: 05/03/2021 UPPER VENOUS STUDY  Patient Name:  Wandra Arthurs Mackie  Date of Exam:   05/03/2021 Medical Rec #: [id number]       Accession #:    [id number] Date of Birth: [birth year]        Patient Gender: F Patient Age:   71 years Exam Location:  [facility] Procedure:      VAS [place] UPPER EXTREMITY VENOUS DUPLEX Referring Phys: GRACE LOEFFLER --------------------------------------------------------------------------------  Indications: Pain, Swelling, and SOB Risk Factors: Pacemaker placement 02/18/2021. Anticoagulation: Eliquis. Limitations: Edema. Comparison Study: No prior study on file Performing Technologist: Sharion Dove RVS  Examination Guidelines: A complete evaluation includes B-mode imaging, spectral Doppler, color Doppler, and power Doppler as needed of all accessible portions of each vessel. Bilateral testing is considered an integral part of a complete examination. Limited examinations for reoccurring indications may be performed as noted.  Right Findings: +----------+------------+---------+-----------+----------+-------+ RIGHT     CompressiblePhasicitySpontaneousPropertiesSummary +----------+------------+---------+-----------+----------+-------+ Subclavian               Yes       Yes                      +----------+------------+---------+-----------+----------+-------+  Left Findings:  +----------+------------+---------+-----------+----------+-------+  LEFT      CompressiblePhasicitySpontaneousPropertiesSummary +----------+------------+---------+-----------+----------+-------+ IJV                      Yes       Yes                      +----------+------------+---------+-----------+----------+-------+ Subclavian               Yes       Yes                      +----------+------------+---------+-----------+----------+-------+ Axillary    Partial      No        No                Acute  +----------+------------+---------+-----------+----------+-------+ Brachial    Partial      No        No                Acute  +----------+------------+---------+-----------+----------+-------+ Radial        Full                                          +----------+------------+---------+-----------+----------+-------+ Ulnar         Full                                          +----------+------------+---------+-----------+----------+-------+ Cephalic      Full                                          +----------+------------+---------+-----------+----------+-------+ Basilic       Full                                          +----------+------------+---------+-----------+----------+-------+  Summary:  Right: No evidence of thrombosis in the subclavian.  Left: No evidence of superficial vein thrombosis in the upper extremity. Findings consistent with acute deep vein thrombosis involving the left axillary vein and left brachial veins. There is acute, partially occlusive DVT noted in the axillary vein at the axilla. It does note extend into the chest. There is partially occlusive acute DVT noted in one of the paired brachial veins in the proximal upper arm, extending into the confluence with the axillary vein in the axilla. Significant edema noted throughout the left forearm, seemingly not related to the DVT.  *See table(s) above for measurements and  observations.     Preliminary     Procedures Procedures   Medications Ordered in ED Medications  heparin ADULT infusion 100 units/mL (25000 units/229mL) (1,200 Units/hr Intravenous New Bag/Given 05/03/21 2201)    ED Course  I have reviewed the triage vital signs and the nursing notes.  Pertinent labs & imaging results that were available during my care of the patient were reviewed by me and considered in my medical decision making (see chart for details).  MDM Rules/Calculators/A&P  86 year old female with history of A. fib and tachybradycardia syndrome s/p pacemaker placement September. She is on Eliquis.  She returns today with progressive swelling of her left upper extremity as above.  Ultrasound concerning for DVT LUE.  Patient was started on treatment dose heparin for further management of her thromboembolic disease.  She does have complaints of shortness of breath, however she is not tachycardic or tachypneic.  She is not requiring O2.  She will be receiving treatment dose heparin.  I do not think that advanced chest imaging is indicated at this time.  EKG showed no acute ischemic changes.  Troponin normal x2.  Low concern for ACS. COVID and flu negative.  White count is elevated to 18, however similar to prior labs.  She is having no infectious symptoms.  She is afebrile here.  Chest x-ray showed no acute infiltrate.  Discussed findings with patient and family.  At this time recommend admission for heparin therapy and further monitoring.  Medicine team contacted.  Final Clinical Impression(s) / ED Diagnoses Final diagnoses:  Acute deep vein thrombosis (DVT) of left upper extremity, unspecified vein [facility])    Rx / DC Orders ED Discharge Orders     None        Idamae Lusher, MD 05/03/21 2359    Tegeler, Gwenyth Allegra, MD 05/06/21 [phone]

## 2021-05-03 NOTE — Progress Notes (Signed)
ANTICOAGULATION CONSULT NOTE - Initial Consult  Pharmacy Consult for Heparin Indication: DVT  Allergies  Allergen Reactions   Ace Inhibitors Cough    Patient Measurements: Height: 5\' 3"  (160 cm) Weight: 71.3 kg (157 lb 3 oz) IBW/kg (Calculated) : 52.4 Heparin Dosing Weight: 67.2 kg  Vital Signs: Temp: 98.2 F (36.8 C) (12/03 1659) BP: 141/73 (12/03 2130) Pulse Rate: 74 (12/03 2130)  Labs: Recent Labs    05/02/21 0000 05/03/21 1900  HGB 10.2*  --   HCT 31*  --   PLT 291  --   TROPONINIHS  --  8    CrCl cannot be calculated (Patient's most recent lab result is older than the maximum 21 days allowed.).   Medical History: Past Medical History:  Diagnosis Date   Chronic venous insufficiency 02/13/2019   Hyperlipidemia    Hypertension    Osteopenia    Varicose veins    Vitamin D deficiency     Medications:  (Not in a hospital admission)  Scheduled:  Infusions:  PRN:   Assessment: 24 yof presenting with Ultrasound concerning for DVT LUE. Heparin per pharmacy consult placed for DVT.  Patient is on apixaban prior to arrival. Last dose 12/3 in the AM. Will require aPTT monitoring due to likely falsely high anti-Xa level secondary to DOAC use.  Hgb 10.2; plt 291  Goal of Therapy:  Heparin level 0.3-0.7 units/ml aPTT 66-102 seconds Monitor platelets by anticoagulation protocol: Yes   Plan:  No initial heparin bolus Start heparin infusion at 1200 units/hr Check aPTT & anti-Xa level in 6-8 hours and daily while on heparin Continue to monitor via aPTT until levels are correlated Continue to monitor H&H and platelets  Lorelei Pont, PharmD, BCPS 05/03/2021 9:45 PM ED Clinical Pharmacist -  [phone]

## 2021-05-03 NOTE — ED Triage Notes (Signed)
The pt is c/o feeling sick since September whenever she had her pacemaker placed  no pain just feels sick  she has also some lt arm swelling for 2-3 days pulse present radial lt wrist.  Visible swelling in that lt forearm and hand

## 2021-05-04 ENCOUNTER — Encounter (HOSPITAL_COMMUNITY): Payer: Self-pay | Admitting: Internal Medicine

## 2021-05-04 ENCOUNTER — Observation Stay (HOSPITAL_COMMUNITY): Payer: Medicare Other

## 2021-05-04 DIAGNOSIS — D72829 Elevated white blood cell count, unspecified: Secondary | ICD-10-CM

## 2021-05-04 DIAGNOSIS — I10 Essential (primary) hypertension: Secondary | ICD-10-CM

## 2021-05-04 DIAGNOSIS — I82622 Acute embolism and thrombosis of deep veins of left upper extremity: Secondary | ICD-10-CM

## 2021-05-04 DIAGNOSIS — I480 Paroxysmal atrial fibrillation: Secondary | ICD-10-CM | POA: Diagnosis not present

## 2021-05-04 DIAGNOSIS — K219 Gastro-esophageal reflux disease without esophagitis: Secondary | ICD-10-CM

## 2021-05-04 DIAGNOSIS — R0602 Shortness of breath: Secondary | ICD-10-CM | POA: Diagnosis not present

## 2021-05-04 HISTORY — DX: Acute embolism and thrombosis of deep veins of left upper extremity: I82.622

## 2021-05-04 LAB — CBC WITH DIFFERENTIAL/PLATELET
Abs Immature Granulocytes: 0.21 10*3/uL — ABNORMAL HIGH (ref 0.00–0.07)
Basophils Absolute: 0.1 10*3/uL (ref 0.0–0.1)
Basophils Relative: 1 %
Eosinophils Absolute: 0.1 10*3/uL (ref 0.0–0.5)
Eosinophils Relative: 1 %
HCT: 28.6 % — ABNORMAL LOW (ref 36.0–46.0)
Hemoglobin: 9.4 g/dL — ABNORMAL LOW (ref 12.0–15.0)
Immature Granulocytes: 2 %
Lymphocytes Relative: 16 %
Lymphs Abs: 2.2 10*3/uL (ref 0.7–4.0)
MCH: 33.5 pg (ref 26.0–34.0)
MCHC: 32.9 g/dL (ref 30.0–36.0)
MCV: 101.8 fL — ABNORMAL HIGH (ref 80.0–100.0)
Monocytes Absolute: 0.7 10*3/uL (ref 0.1–1.0)
Monocytes Relative: 5 %
Neutro Abs: 10.7 10*3/uL — ABNORMAL HIGH (ref 1.7–7.7)
Neutrophils Relative %: 75 %
Platelets: 286 10*3/uL (ref 150–400)
RBC: 2.81 MIL/uL — ABNORMAL LOW (ref 3.87–5.11)
RDW: 21.1 % — ABNORMAL HIGH (ref 11.5–15.5)
WBC: 14.1 10*3/uL — ABNORMAL HIGH (ref 4.0–10.5)
nRBC: 0.1 % (ref 0.0–0.2)

## 2021-05-04 LAB — COMPREHENSIVE METABOLIC PANEL
ALT: 11 U/L (ref 0–44)
AST: 17 U/L (ref 15–41)
Albumin: 3.4 g/dL — ABNORMAL LOW (ref 3.5–5.0)
Alkaline Phosphatase: 48 U/L (ref 38–126)
Anion gap: 9 (ref 5–15)
BUN: 21 mg/dL (ref 8–23)
CO2: 27 mmol/L (ref 22–32)
Calcium: 9 mg/dL (ref 8.9–10.3)
Chloride: 99 mmol/L (ref 98–111)
Creatinine, Ser: 0.98 mg/dL (ref 0.44–1.00)
GFR, Estimated: 57 mL/min — ABNORMAL LOW (ref >60)
Glucose, Bld: 108 mg/dL — ABNORMAL HIGH (ref 70–99)
Potassium: 3.7 mmol/L (ref 3.5–5.1)
Sodium: 135 mmol/L (ref 135–145)
Total Bilirubin: 0.9 mg/dL (ref 0.3–1.2)
Total Protein: 5.9 g/dL — ABNORMAL LOW (ref 6.5–8.1)

## 2021-05-04 LAB — APTT: aPTT: 90 seconds — ABNORMAL HIGH (ref 24–36)

## 2021-05-04 LAB — HEPARIN LEVEL (UNFRACTIONATED): Heparin Unfractionated: >1.1 IU/mL — ABNORMAL HIGH (ref 0.30–0.70)

## 2021-05-04 LAB — MAGNESIUM: Magnesium: 1.8 mg/dL (ref 1.7–2.4)

## 2021-05-04 MED ORDER — HYDROCHLOROTHIAZIDE 25 MG PO TABS
25.0000 mg | ORAL_TABLET | Freq: Every day | ORAL | Status: DC
  Administered 2021-05-05: 25 mg via ORAL
  Filled 2021-05-04 (×2): qty 1

## 2021-05-04 MED ORDER — PANTOPRAZOLE SODIUM 40 MG PO TBEC
40.0000 mg | DELAYED_RELEASE_TABLET | Freq: Two times a day (BID) | ORAL | Status: DC
  Administered 2021-05-04 – 2021-05-05 (×2): 40 mg via ORAL
  Filled 2021-05-04 (×3): qty 1

## 2021-05-04 MED ORDER — POLYETHYLENE GLYCOL 3350 17 G PO PACK: 17.0000 g | PACK | Freq: Every day | ORAL | Status: DC | PRN

## 2021-05-04 MED ORDER — METOPROLOL SUCCINATE ER 50 MG PO TB24
75.0000 mg | ORAL_TABLET | Freq: Every day | ORAL | Status: DC
  Administered 2021-05-04: 20:00:00 75 mg via ORAL
  Filled 2021-05-04: qty 1

## 2021-05-04 MED ORDER — VALSARTAN-HYDROCHLOROTHIAZIDE 320-25 MG PO TABS: 1.0000 | ORAL_TABLET | Freq: Every day | ORAL | Status: DC

## 2021-05-04 MED ORDER — ACETAMINOPHEN 650 MG RE SUPP: 650.0000 mg | Freq: Four times a day (QID) | RECTAL | Status: DC | PRN

## 2021-05-04 MED ORDER — ONDANSETRON HCL 4 MG PO TABS: 4.0000 mg | ORAL_TABLET | Freq: Four times a day (QID) | ORAL | Status: DC | PRN

## 2021-05-04 MED ORDER — ACETAMINOPHEN 325 MG PO TABS
650.0000 mg | ORAL_TABLET | Freq: Four times a day (QID) | ORAL | Status: DC | PRN
  Administered 2021-05-04: 650 mg via ORAL
  Filled 2021-05-04: qty 2

## 2021-05-04 MED ORDER — IOHEXOL 350 MG/ML SOLN
75.0000 mL | Freq: Once | INTRAVENOUS | Status: AC | PRN
  Administered 2021-05-04: 03:00:00 75 mL via INTRAVENOUS

## 2021-05-04 MED ORDER — HYDRALAZINE HCL 20 MG/ML IJ SOLN: 10.0000 mg | Freq: Four times a day (QID) | INTRAMUSCULAR | Status: DC | PRN

## 2021-05-04 MED ORDER — ONDANSETRON HCL 4 MG/2ML IJ SOLN: 4.0000 mg | Freq: Four times a day (QID) | INTRAMUSCULAR | Status: DC | PRN

## 2021-05-04 MED ORDER — ENOXAPARIN SODIUM 80 MG/0.8ML IJ SOSY
70.0000 mg | PREFILLED_SYRINGE | Freq: Two times a day (BID) | INTRAMUSCULAR | Status: DC
  Administered 2021-05-04 – 2021-05-05 (×2): 70 mg via SUBCUTANEOUS
  Filled 2021-05-04: qty 0.7
  Filled 2021-05-04 (×2): qty 0.8

## 2021-05-04 MED ORDER — IRBESARTAN 300 MG PO TABS
300.0000 mg | ORAL_TABLET | Freq: Every day | ORAL | Status: DC
  Administered 2021-05-05: 300 mg via ORAL
  Filled 2021-05-04 (×2): qty 1

## 2021-05-04 NOTE — Progress Notes (Signed)
ANTICOAGULATION CONSULT NOTE   Pharmacy Consult for Heparin Indication: DVT, afib  Allergies  Allergen Reactions   Ace Inhibitors Cough    Patient Measurements: Height: 5\' 3"  (160 cm) Weight: 71.3 kg (157 lb 3 oz) IBW/kg (Calculated) : 52.4 Heparin Dosing Weight: 67.2 kg  Vital Signs: Temp: 97.8 F (36.6 C) (12/04 0354) Temp Source: Oral (12/04 0354) BP: 138/94 (12/04 0354) Pulse Rate: 62 (12/04 0545)  Labs: Recent Labs    05/02/21 0000 05/03/21 1900 05/03/21 2148 05/03/21 2300 05/04/21 0537  HGB 10.2*  --   --  9.6* 9.4*  HCT 31*  --   --  29.2* 28.6*  PLT 291  --   --  286 286  APTT  --   --  39*  --  90*  LABPROT  --   --  18.8*  --   --   INR  --   --  1.6*  --   --   HEPARINUNFRC  --   --  >1.10*  --   --   CREATININE  --   --   --  1.09*  --   TROPONINIHS  --  8  --  5  --      Estimated Creatinine Clearance: 36.4 mL/min (A) (by C-G formula based on SCr of 1.09 mg/dL (H)).   Medical History: Past Medical History:  Diagnosis Date   Chronic venous insufficiency 02/13/2019   Hyperlipidemia    Hypertension    Osteopenia    Varicose veins    Vitamin D deficiency     Medications:  (Not in a hospital admission)  Scheduled:  Infusions:  PRN:   Assessment: 36 yof presenting with Ultrasound concerning for DVT LUE. Heparin per pharmacy consult placed for DVT.  Patient is on apixaban prior to arrival. Last dose 12/3 in the AM. Will require aPTT monitoring due to likely falsely high anti-Xa level secondary to DOAC use.  Hgb 10.2; plt 291  12/4 AM update:  aPTT therapeutic  Goal of Therapy:  Heparin level 0.3-0.7 units/ml aPTT 66-102 seconds Monitor platelets by anticoagulation protocol: Yes   Plan:  Cont heparin infusion at 1200 units/hr Check aPTT & anti-Xa level in 6-8 hours and daily while on heparin Continue to monitor via aPTT until levels are correlated Continue to monitor H&H and platelets  Narda Bonds, PharmD, BCPS Clinical  Pharmacist Phone: [phone]

## 2021-05-04 NOTE — Care Management (Signed)
Attempted to give OBS notice, patient not available at this time confidential message left for patient to return call

## 2021-05-04 NOTE — Assessment & Plan Note (Signed)
Continuing home regimen of daily PPI therapy.  

## 2021-05-04 NOTE — Progress Notes (Signed)
Pt received from ED alter and oriented, pleasant and ambulatory.  Left upper arm visibly enlarged compared to right.  Small purple bruise on left chest, approx location of PPM.  CHG performed, VS taken, CCMD notified.  Pt oriented to equipment.  Grand daughter at bedside.  Discussed plan for lovenox shot.  Will cont plan of care

## 2021-05-04 NOTE — H&P (Addendum)
History and Physical    Alexandra Kim NOI:[id number] DOB: March 31, 1935 DOA: 05/03/2021  PCP: [name], MD  Patient coming from: Home   Chief Complaint:  Chief Complaint  Patient presents with   feels sick     HPI:    86 year old female with past medical history of hypertension, vitamin D deficiency, chronic leukocytosis (active workup for leukemia),  hyperlipidemia and recent diagnosis of paroxysmal atrial fibrillation ([id number] of 4) complicated by tachybrady syndrome status post pacemaker placement 9/20 presenting to [facility] emergency department with left upper extremity swelling.  Patient explains that approximately 2 weeks ago she noticed that she began to develop shortness of breath.  The shortness of breath was mild to moderate intensity and progressively worsened in the days that followed.  Patient denies any associated paroxysmal nocturnal dyspnea, leg swelling, pillow orthopnea or chest pain.  Approximately 3 days ago she additionally began to develop left upper extremity swelling.  Initially this was mild in intensity progressively became more more severe.  Patient complains of associated malaise and weakness.  Patient denies any associated localized redness warmth or fevers.  Patient denies any recent travel or sick contacts.  Patient reports being compliant with her Eliquis regimen since diagnosed with paroxysmal atrial fibrillation in September.  Due to progressively worsening symptoms the patient eventually presented to [facility] emergency department for evaluation.  Upon evaluation in the emergency department patient underwent ultrasound of the left upper extremity identifying an acute partially occlusive DVT noted in the axillary vein as well as partially occlusive acute DVT noted in one of the paired brachial veins.  Patient was initiated on heparin infusion.  Hospitalist group was then called to assess the patient for admission to the  hospital  Review of Systems:   Review of Systems  Constitutional:  Positive for malaise/fatigue.  Respiratory:  Positive for shortness of breath.   Neurological:  Positive for weakness.   Past Medical History:  Diagnosis Date   Chronic venous insufficiency 02/13/2019   Hyperlipidemia    Hypertension    Osteopenia    Varicose veins    Vitamin D deficiency     Past Surgical History:  Procedure Laterality Date   ABDOMINAL HYSTERECTOMY  1990   PACEMAKER IMPLANT N/A 02/18/2021   Procedure: PACEMAKER IMPLANT;  Surgeon: Thompson Grayer, MD;  Location: [facility];  Service: Cardiovascular;  Laterality: N/A;     reports that she quit smoking about 14 years ago. Her smoking use included cigarettes. She has never used smokeless tobacco. She reports that she does not drink alcohol and does not use drugs.  Allergies  Allergen Reactions   Ace Inhibitors Cough    Family History  Problem Relation Age of Onset   Heart disease Mother    Diabetes Mother    Hypertension Mother    Lung cancer Father    Breast cancer Sister      Prior to Admission medications   Medication Sig Start Date End Date Taking? Authorizing Provider  apixaban (ELIQUIS) 5 MG TABS tablet Take 1 tablet (5 mg total) by mouth 2 (two) times daily. 02/26/21   Shirley Friar, PA-C  cholecalciferol (VITAMIN D) 1000 UNITS tablet Take 1,000 Units by mouth daily.    [provider]  metoprolol succinate (TOPROL-XL) 50 MG 24 hr tablet Take 1.5 tablets (75 mg total) by mouth at bedtime. 02/26/21   Shirley Friar, PA-C  multivitamin-lutein (OCUVITE-LUTEIN) CAPS capsule Take 1 capsule by mouth in the  morning and at bedtime.    [provider]  pantoprazole (PROTONIX) 40 MG tablet Take 40 mg by mouth 2 (two) times daily. 07/04/18   [provider]  valsartan-hydrochlorothiazide (DIOVAN-HCT) 320-25 MG tablet Take 1 tablet by mouth daily. 08/15/18   [provider]    Physical  Exam: Vitals:   05/04/21 0030 05/04/21 0100 05/04/21 0145 05/04/21 0200  BP: 137/68 137/70 135/76 135/64  Pulse: 64 66 68 67  Resp: (!) 21 (!) 22 (!) 23 (!) 21  Temp:      SpO2: 93% 95% 95% 96%  Weight:      Height:        Constitutional: Awake alert and oriented x3, no associated distress.   Skin: Small ecchymoses noted LUE.  No other rashes/lesions seen.  good skin turgor noted. Eyes: Pupils are equally reactive to light.  No evidence of scleral icterus or conjunctival pallor.  ENMT: Moist mucous membranes noted.  Posterior pharynx clear of any exudate or lesions.   Neck: normal, supple, no masses, no thyromegaly.  No evidence of jugular venous distension.   Respiratory: Mild bibasilar rales,  no wheezing, Normal respiratory effort. No accessory muscle use.  Cardiovascular: Regular rate and rhythm, no murmurs / rubs / gallops. No extremity edema. 2+ pedal pulses. No carotid bruits.  Chest:   Nontender without crepitus or deformity.   Back:   Nontender without crepitus or deformity. Abdomen: Abdomen is soft and nontender.  No evidence of intra-abdominal masses.  Positive bowel sounds noted in all quadrants.   Musculoskeletal: No joint deformity upper and lower extremities. Good ROM, no contractures. Normal muscle tone.  Neurologic: CN 2-12 grossly intact. Sensation intact.  Patient moving all 4 extremities spontaneously.  Patient is following all commands.  Patient is responsive to verbal stimuli.   Psychiatric: Patient exhibits normal mood with appropriate affect.  Patient seems to possess insight as to their current situation.     Labs on Admission: I have personally reviewed following labs and imaging studies -   CBC: Recent Labs  Lab 05/02/21 0000 05/03/21 2300  WBC 19.2 18.6*  NEUTROABS 15.74  --   HGB 10.2* 9.6*  HCT 31* 29.2*  MCV  --  102.1*  PLT 291 127   Basic Metabolic Panel: Recent Labs  Lab 05/03/21 2300  NA 137  K 3.7  CL 102  CO2 27  GLUCOSE 127*  BUN  24*  CREATININE 1.09*  CALCIUM 9.4   GFR: Estimated Creatinine Clearance: 36.4 mL/min (A) (by C-G formula based on SCr of 1.09 mg/dL (H)). Liver Function Tests: No results for input(s): AST, ALT, ALKPHOS, BILITOT, PROT, ALBUMIN in the last 168 hours. No results for input(s): LIPASE, AMYLASE in the last 168 hours. No results for input(s): AMMONIA in the last 168 hours. Coagulation Profile: Recent Labs  Lab 05/03/21 2148  INR 1.6*   Cardiac Enzymes: No results for input(s): CKTOTAL, CKMB, CKMBINDEX, TROPONINI in the last 168 hours. BNP (last 3 results) No results for input(s): PROBNP in the last 8760 hours. HbA1C: No results for input(s): HGBA1C in the last 72 hours. CBG: No results for input(s): GLUCAP in the last 168 hours. Lipid Profile: No results for input(s): CHOL, HDL, LDLCALC, TRIG, CHOLHDL, LDLDIRECT in the last 72 hours. Thyroid Function Tests: No results for input(s): TSH, T4TOTAL, FREET4, T3FREE, THYROIDAB in the last 72 hours. Anemia Panel: Recent Labs    05/02/21 1333  VITAMINB12 491  FOLATE 6.3  FERRITIN 176  TIBC 293  IRON 63   Urine analysis:    Component Value Date/Time   COLORURINE YELLOW 05/03/2021 2030   [place] 05/03/2021 2030   [place] 1.020 05/03/2021 2030   PHURINE 5.0 05/03/2021 2030   GLUCOSEU NEGATIVE 05/03/2021 2030   [place] NEGATIVE 05/03/2021 2030   [place] NEGATIVE 05/03/2021 2030   [place] 05/03/2021 2030   PROTEINUR NEGATIVE 05/03/2021 2030   NITRITE NEGATIVE 05/03/2021 2030   LEUKOCYTESUR NEGATIVE 05/03/2021 2030    Radiological Exams on Admission - Personally Reviewed: DG Chest 2 View  Result Date: 05/03/2021 CLINICAL DATA:  Chest pain. EXAM: CHEST - 2 VIEW COMPARISON:  March 24, 2021 FINDINGS: Cardiac pacemaker in stable position. Cardiomediastinal silhouette is normal. Mediastinal contours appear intact. There is no evidence of focal airspace consolidation, pleural effusion or pneumothorax. Minimal  atelectasis versus scarring in the lung bases. Osseous structures are without acute abnormality. Soft tissues are grossly normal. IMPRESSION: No active cardiopulmonary disease. Electronically Signed   By: Fidela Salisbury M.D.   On: 05/03/2021 17:52   CT Angio Chest Pulmonary Embolism (PE) W or WO Contrast  Result Date: 05/04/2021 CLINICAL DATA:  Shortness of breath and upper extremity DVT EXAM: CT ANGIOGRAPHY CHEST WITH CONTRAST TECHNIQUE: Multidetector CT imaging of the chest was performed using the standard protocol during bolus administration of intravenous contrast. Multiplanar CT image reconstructions and MIPs were obtained to evaluate the vascular anatomy. CONTRAST:  75m OMNIPAQUE IOHEXOL 350 MG/ML SOLN COMPARISON:  None. FINDINGS: Cardiovascular: Contrast injection is sufficient to demonstrate satisfactory opacification of the pulmonary arteries to the segmental level. There is no pulmonary embolus or evidence of right heart strain. The size of the main pulmonary artery is normal. Heart size is normal, with no pericardial effusion. The course and caliber of the aorta are normal. There is atherosclerotic calcification. No acute aortic syndrome. Mediastinum/Nodes: No mediastinal, hilar or axillary lymphadenopathy. Normal visualized thyroid. Thoracic esophageal course is normal. Lungs/Pleura: Airways are patent. No pleural effusion, lobar consolidation, pneumothorax or pulmonary infarction. Upper Abdomen: Contrast bolus timing is not optimized for evaluation of the abdominal organs. The visualized portions of the organs of the upper abdomen are normal. Musculoskeletal: No chest wall abnormality. No bony spinal canal stenosis. Review of the MIP images confirms the above findings. IMPRESSION: 1. No pulmonary embolus or acute aortic syndrome. Aortic Atherosclerosis (ICD10-I70.0). Electronically Signed   By: KUlyses JarredM.D.   On: 05/04/2021 02:59   UE VENOUS DUPLEX (7am - 7pm)  Result Date:  05/03/2021 UPPER VENOUS STUDY  Patient Name:  BWandra ArthursHAMMER  Date of Exam:   05/03/2021 Medical Rec #: [id number]      Accession #:    [id number] of Birth: 4Jan 12, 1936       Patient Gender: F Patient Age:   [age]years [facility]      VAS [place]PPER EXTREMITY VENOUS DUPLEX Referring Phys: GRACE LOEFFLER --------------------------------------------------------------------------------  Indications: Pain, Swelling, and SOB Risk Factors: Pacemaker placement 02/18/2021. Anticoagulation: Eliquis. Limitations: Edema. Comparison Study: No prior study on file Performing Technologist: CSharion DoveRVS  Examination Guidelines: A complete evaluation includes B-mode imaging, spectral Doppler, color Doppler, and power Doppler as needed of all accessible portions of each vessel. Bilateral testing is considered an integral part of a complete examination. Limited examinations for reoccurring indications may be performed as noted.  Right Findings: +----------+------------+---------+-----------+----------+-------+ RIGHT     CompressiblePhasicitySpontaneousPropertiesSummary +----------+------------+---------+-----------+----------+-------+ Subclavian  Yes       Yes                      +----------+------------+---------+-----------+----------+-------+  Left Findings: +----------+------------+---------+-----------+----------+-------+ LEFT      CompressiblePhasicitySpontaneousPropertiesSummary +----------+------------+---------+-----------+----------+-------+ IJV                      Yes       Yes                      +----------+------------+---------+-----------+----------+-------+ Subclavian               Yes       Yes                      +----------+------------+---------+-----------+----------+-------+ Axillary    Partial      No        No                Acute  +----------+------------+---------+-----------+----------+-------+ Brachial     Partial      No        No                Acute  +----------+------------+---------+-----------+----------+-------+ Radial        Full                                          +----------+------------+---------+-----------+----------+-------+ Ulnar         Full                                          +----------+------------+---------+-----------+----------+-------+ Cephalic      Full                                          +----------+------------+---------+-----------+----------+-------+ Basilic       Full                                          +----------+------------+---------+-----------+----------+-------+  Summary:  Right: No evidence of thrombosis in the subclavian.  Left: No evidence of superficial vein thrombosis in the upper extremity. Findings consistent with acute deep vein thrombosis involving the left axillary vein and left brachial veins. There is acute, partially occlusive DVT noted in the axillary vein at the axilla. It does note extend into the chest. There is partially occlusive acute DVT noted in one of the paired brachial veins in the proximal upper arm, extending into the confluence with the axillary vein in the axilla. Significant edema noted throughout the left forearm, seemingly not related to the DVT.  *See table(s) above for measurements and observations.     Preliminary     EKG: Personally reviewed.  Rhythm is normal sinus rhythm with heart rate of 68 bpm.  QTc of 459.  Left anterior fascicular block noted.  No dynamic ST segment changes appreciated.  Assessment/Plan  * Acute deep vein thrombosis (DVT) of left upper extremity (HCC) Left upper extremity DVT, which seemingly has developed despite being on chronic apixaban therapy for atrial fibrillation Patient  reports compliance with her medication Due to this suspected Eliquis failure, ER provider has placed patient on heparin infusion for now We will keep heparin infusion in place for the  time being Will need to discuss anticoagulation options in this patient who possibly has underlying malignancy -patient is currently being worked up as an outpatient for chronic leukocytosis with concern for an underlying hematologic malignancy.  This could potentially explain the failure of her current oral anticoagulant. CT angiogram of the chest negative for pulmonary embolism despite complaints of shortness of breath Monitor patient on telemetry  AF (paroxysmal atrial fibrillation) (HCC) CHA2DS2-VASc or 4 Identified and admitted for new onset paroxysmal atrial fibrillation in September complicated by tachybradycardia syndrome Patient is now status post pacemaker placement Patient has been on Eliquis therapy since diagnosis and has been transition to heparin for now as noted above Continuing home regimen of oral metoprolol for rate control Monitoring patient on telemetry  Leukocytosis Patient recently referred to Dr. Bobby Rumpf with hematology/oncology with first visit on 11/18 for work-up of chronic leukocytosis There is underlying concern for hematologic malignancy, work-up is still pending including kappa lambda light chains, multiple myeloma panel and protein electrophoresis Will need to discuss concern for Eliquis failure in this context with hematology/oncology  Essential hypertension Resume patients home regimen of oral antihypertensives Titrate antihypertensive regimen as necessary to achieve adequate BP control PRN intravenous antihypertensives for excessively elevated blood pressure    GERD without esophagitis Continuing home regimen of daily PPI therapy.       Code Status:  Full code  code status decision has been confirmed with: Patient Family Communication: Home   Status is: Observation  The patient remains OBS appropriate and will d/c before 2 midnights.       Vernelle Emerald MD [facility] Pager [phone]  If 7PM-7AM, please contact  night-coverage [email or web address] Use universal [place] password for that web site. If you do not have the password, please call the hospital operator.  05/04/2021, 3:26 AM

## 2021-05-04 NOTE — Assessment & Plan Note (Signed)
   CHA2DS2-VASc or 4  Identified and admitted for new onset paroxysmal atrial fibrillation in September complicated by tachybradycardia syndrome  Patient is now status post pacemaker placement  Patient has been on Eliquis therapy since diagnosis and has been transition to heparin for now as noted above  Continuing home regimen of oral metoprolol for rate control  Monitoring patient on telemetry

## 2021-05-04 NOTE — Assessment & Plan Note (Signed)
   Patient recently referred to Dr. Bobby Rumpf with hematology/oncology with first visit on 11/18 for work-up of chronic leukocytosis  There is underlying concern for hematologic malignancy, work-up is still pending including kappa lambda light chains, multiple myeloma panel and protein electrophoresis  Will need to discuss concern for Eliquis failure in this context with hematology/oncology

## 2021-05-04 NOTE — Discharge Instructions (Signed)
Medicare Outpatient Observation Notice   Patient name:  Alexandra Kim Patient number:  [id number]                                                                                                                                                                       You're a hospital outpatient receiving observation services. You are not an inpatient because:    Left upper arm swelling   You require hospital care for evaluation and/or treatment.  It is expected you will need hospital care for less than a total of two days.                                                                                                                                                                         Being an outpatient may affect what you pay in a hospital:   When you're a hospital outpatient, your observation stay is covered under Medicare Part B.   For Part B services, you generally pay:   A copayment for each outpatient hospital service you get. Part B copayments may vary by type of service.   20% of the Medicare-approved amount for most doctor services, after the Part B deductible.   Observation services may affect coverage and payment of your care after you leave the hospital:     If you need skilled nursing facility (SNF) care after you leave the hospital, Medicare Part A will only cover SNF care if you've had a 3-day minimum, medically necessary, inpatient hospital stay for a related illness or injury. An inpatient hospital stay begins the day the hospital admits you as an inpatient based on a doctor's order and doesn't include the day you're discharged.   If you have Medicaid, a Medicare Advantage plan or other health plan, Medicaid or the plan may have different rules for SNF coverage after you leave the hospital. Check with Medicaid or your plan.   NOTE: Medicare Part A generally doesn't cover outpatient  hospital services, like an observation stay. However, Part A will generally cover  medically necessary inpatient services if the hospital admits you as an inpatient based on a doctor's order. In most cases, you'll pay a one-time deductible for all of your inpatient hospital services for the first 60 days you're in a hospital.                                                                                                                                                                      If you have any questions about your observation services, ask the hospital staff member giving you this notice or the doctor providing your hospital care. You can also ask to speak with someone from the hospital's utilization or discharge planning department.   You can also call 1-800-MEDICARE (1-409 428 9343).  TTY users should call 3341920991.   Form CMS 70177-LTJQ   Expiration 05/31/2021 OMB APPROVAL [id number]          Your costs for medications:     Generally, prescription and over-the-counter drugs, including "self-administered drugs," you get in a hospital outpatient setting (like an emergency department) aren't covered by Part B. "Self- administered drugs" are drugs you'd normally take on your own. For safety reasons, many hospitals don't allow you to take medications brought from home. If you have a Medicare prescription drug plan (Part D), your plan may help you pay for these drugs. You'll likely need to pay out-of- pocket for these drugs and submit a claim to your drug plan for a refund. Contact your drug plan for more information.                                                                                                                                                                        If you're enrolled in a Medicare Advantage plan (like an HMO or PPO) or other Medicare health plan (Part C), your costs and coverage may be different. Check with your plan to find out  about coverage for outpatient observation services.    If you're a Qualified Medicare Beneficiary  through your state Medicaid program, you can't be billed for Part A or Part B deductibles, coinsurance, and copayments.                                                                                                                                                                      Additional Information (Optional):                                                                                                                                                                                Please sign below to show you received and understand this notice.                                         Date: 05/04/21 / Time:3:57 PM   CMS does not discriminate in its programs and activities. To request this publication in alternative format, please call: 1-800-MEDICARE or email:[email or web address].   Form CMS 06237-SEGB   Expiration 05/31/2021 OMB APPROVAL [id number]      Patient   Add No image attached Trace Slow Corrupt Edit Data Change Template Print On

## 2021-05-04 NOTE — Assessment & Plan Note (Addendum)
   Left upper extremity DVT, which seemingly has developed despite being on chronic apixaban therapy for atrial fibrillation  Patient reports compliance with her medication  Due to this suspected Eliquis failure, ER provider has placed patient on heparin infusion for now  We will keep heparin infusion in place for the time being  Will need to discuss anticoagulation options in this patient who possibly has underlying malignancy -patient is currently being worked up as an outpatient for chronic leukocytosis with concern for an underlying hematologic malignancy.  This could potentially explain the failure of her current oral anticoagulant.  Alternatively, DVT could have developed due to the recent pacemaker placement, a much more common scenario.   EP would need to be notified of this development on day shift.   CT angiogram of the chest negative for pulmonary embolism despite complaints of shortness of breath  Monitor patient on telemetry

## 2021-05-04 NOTE — Care Management Obs Status (Addendum)
[place] NOTIFICATION   Patient Details  Name: Alexandra Kim MRN: [id number] Date of Birth: 1934/10/17   Medicare Observation Status Notification Given:  Yes  Given over the phone due to remote, patient expressed understanding of notice but did not want to give permission to sign. Copy placed in patient instructions for printoff  Verdell Carmine, RN 05/04/2021, 3:58 PM

## 2021-05-04 NOTE — Progress Notes (Signed)
ANTICOAGULATION CONSULT NOTE - Initial Consult  Pharmacy Consult for Enoxaparin Indication: DVT  Allergies  Allergen Reactions   Ace Inhibitors Cough    Patient Measurements: Height: 5\' 3"  (160 cm) Weight: 71.3 kg (157 lb 3 oz) IBW/kg (Calculated) : 52.4 Heparin Dosing Weight: 67.2 kg  Vital Signs: Temp: 97.8 F (36.6 C) (12/04 0354) Temp Source: Oral (12/04 0354) BP: 140/64 (12/04 1100) Pulse Rate: 72 (12/04 1115)  Labs: Recent Labs    05/02/21 0000 05/03/21 1900 05/03/21 2148 05/03/21 2300 05/04/21 0537  HGB 10.2*  --   --  9.6* 9.4*  HCT 31*  --   --  29.2* 28.6*  PLT 291  --   --  286 286  APTT  --   --  39*  --  90*  LABPROT  --   --  18.8*  --   --   INR  --   --  1.6*  --   --   HEPARINUNFRC  --   --  >1.10*  --  >1.10*  CREATININE  --   --   --  1.09* 0.98  TROPONINIHS  --  8  --  5  --      Estimated Creatinine Clearance: 40.5 mL/min (by C-G formula based on SCr of 0.98 mg/dL).   Medical History: Past Medical History:  Diagnosis Date   Chronic venous insufficiency 02/13/2019   Hyperlipidemia    Hypertension    Osteopenia    Varicose veins    Vitamin D deficiency     Medications:  (Not in a hospital admission)  Scheduled:  Infusions:  PRN:   Assessment: 33 yof presenting with Ultrasound concerning for DVT LUE. Heparin per pharmacy consult placed for DVT, decision has been made to transition patient to enoxaparin.  Patient is on apixaban prior to arrival. Last dose 12/3 in the AM. Will require aPTT monitoring due to likely falsely high anti-Xa level secondary to DOAC use.  Overnight aPTT therapeutic. Heparin continued at 1200 units/hr.  Hgb 9.6>>9.4; plt 286 Estimated Creatinine Clearance: 40.5 mL/min (by C-G formula based on SCr of 0.98 mg/dL).  Goal of Therapy:  Monitor platelets by anticoagulation protocol: Yes   Plan:  Enoxaparin 70 mg q12hr Patient will need education on enoxaparin usage prior to discharge Continue to monitor  H&H and platelets  Lorelei Pont, PharmD, BCPS 05/04/2021 12:30 PM ED Clinical Pharmacist -  [phone]

## 2021-05-04 NOTE — Progress Notes (Signed)
Patient seen and examined.  She was admitted by nighttime hospitalist early morning hours.  See history physical and assessment plan.  Agree with following modifications.  I discussed in detail with patient, her daughter on the phone about further management plan.  In brief, 86 year old independent at home with history of hypertension, vitamin D deficiency and chronic leukocytosis who has been work-up for leukocytosis by Dr. Bobby Rumpf at [place] [facility], recent diagnosis of paroxysmal A. fib with tachybradycardia syndrome status post pacemaker on 9/20 presented to [facility] with shortness of breath for 2 weeks and left upper extremity swelling for 3 days.  Seen by oncology office on 12/2.  CT angiogram was negative for pulmonary embolism.  Visible parenchyma with no evidence of lung tumors or abnormalities. WC count elevated and mostly similar to previous. Upper extremity duplexes with acute and partially occlusive DVT left axillary vein in the axilla, brachial veins in the proximal upper arm along with significant forearm edema.   Assessment plan: Acute symptomatic upper extremity DVT in a patient who is therapeutic on Eliquis that she takes for paroxysmal A. fib.Possible thromboembolism while already on Eliquis. -Rule out malignancy, patient is already following up with cancer center at [facility] where there is tentative plan for bone marrow biopsy. -Thromboembolism could have been triggered from pacemaker insertion, I will notify her EP cardiologist tomorrow. -Best treatment option for this patient is a Lovenox injection.  We discussed in detail about going home with the Lovenox and following up with her hematologist oncologist.  Stop heparin.  Changed to Lovenox.  Injection techniques teaching to the patient.  Anticipate discharge home tomorrow on subcutaneous Lovenox 1 mg/kg twice a day.   Total time spent: 25 minutes.  Same-day admission.  No charge visit.

## 2021-05-04 NOTE — Assessment & Plan Note (Signed)
.   Resume patients home regimen of oral antihypertensives . Titrate antihypertensive regimen as necessary to achieve adequate BP control . PRN intravenous antihypertensives for excessively elevated blood pressure   

## 2021-05-05 ENCOUNTER — Other Ambulatory Visit (HOSPITAL_COMMUNITY): Payer: Self-pay

## 2021-05-05 DIAGNOSIS — D72820 Lymphocytosis (symptomatic): Secondary | ICD-10-CM

## 2021-05-05 DIAGNOSIS — I10 Essential (primary) hypertension: Secondary | ICD-10-CM | POA: Diagnosis not present

## 2021-05-05 DIAGNOSIS — I480 Paroxysmal atrial fibrillation: Secondary | ICD-10-CM | POA: Diagnosis not present

## 2021-05-05 DIAGNOSIS — I82622 Acute embolism and thrombosis of deep veins of left upper extremity: Secondary | ICD-10-CM | POA: Diagnosis not present

## 2021-05-05 LAB — KAPPA/LAMBDA LIGHT CHAINS
Kappa free light chain: 20.6 mg/L — ABNORMAL HIGH (ref 3.3–19.4)
Kappa, lambda light chain ratio: 1 (ref 0.26–1.65)
Lambda free light chains: 20.5 mg/L (ref 5.7–26.3)

## 2021-05-05 LAB — HAPTOGLOBIN: Haptoglobin: 90 mg/dL (ref 41–333)

## 2021-05-05 MED ORDER — ENOXAPARIN SODIUM 80 MG/0.8ML IJ SOSY: 70.0000 mg | PREFILLED_SYRINGE | Freq: Two times a day (BID) | INTRAMUSCULAR | 0 refills | Status: DC

## 2021-05-05 MED ORDER — ENOXAPARIN SODIUM 60 MG/0.6ML IJ SOSY: 60.0000 mg | PREFILLED_SYRINGE | Freq: Two times a day (BID) | INTRAMUSCULAR | 5 refills | Status: DC

## 2021-05-05 NOTE — Progress Notes (Signed)
D/c tele and Ivs. Went over AVS with pt and all questions were addressed.  Bulah Lurie S Anastazja Isaac, RN  

## 2021-05-05 NOTE — Progress Notes (Signed)
ANTICOAGULATION CONSULT NOTE - Follow Up  Consult  Pharmacy Consult for Enoxaparin Indication: DVT  Allergies  Allergen Reactions   Ace Inhibitors Cough    Patient Measurements: Height: 5\' 3"  (160 cm) Weight: 71.3 kg (157 lb 3 oz) IBW/kg (Calculated) : 52.4 Heparin Dosing Weight: 67.2 kg  Vital Signs: Temp: 97.9 F (36.6 C) (12/05 0821) Temp Source: Oral (12/05 0821) BP: 119/82 (12/05 0821) Pulse Rate: 65 (12/05 0821)  Labs: Recent Labs    05/03/21 1900 05/03/21 2148 05/03/21 2300 05/04/21 0537  HGB  --   --  9.6* 9.4*  HCT  --   --  29.2* 28.6*  PLT  --   --  286 286  APTT  --  39*  --  90*  LABPROT  --  18.8*  --   --   INR  --  1.6*  --   --   HEPARINUNFRC  --  >1.10*  --  >1.10*  CREATININE  --   --  1.09* 0.98  TROPONINIHS 8  --  5  --      Estimated Creatinine Clearance: 40.5 mL/min (by C-G formula based on SCr of 0.98 mg/dL).   Medical History: Past Medical History:  Diagnosis Date   Chronic venous insufficiency 02/13/2019   Hyperlipidemia    Hypertension    Osteopenia    Varicose veins    Vitamin D deficiency     Medications:  Medications Prior to Admission  Medication Sig Dispense Refill Last Dose   apixaban (ELIQUIS) 5 MG TABS tablet Take 1 tablet (5 mg total) by mouth 2 (two) times daily. 60 tablet 3 05/03/2021 at 0800   cholecalciferol (VITAMIN D) 1000 UNITS tablet Take 1,000 Units by mouth daily.   05/03/2021   metoprolol succinate (TOPROL-XL) 50 MG 24 hr tablet Take 1.5 tablets (75 mg total) by mouth at bedtime. 45 tablet 6 05/02/2021 at 2200   multivitamin-lutein (OCUVITE-LUTEIN) CAPS capsule Take 1 capsule by mouth in the morning and at bedtime.   05/03/2021   pantoprazole (PROTONIX) 40 MG tablet Take 40 mg by mouth daily as needed.   Past Week   valsartan-hydrochlorothiazide (DIOVAN-HCT) 320-25 MG tablet Take 1 tablet by mouth daily.   05/03/2021    Scheduled:  Infusions:  PRN:   Assessment: 63 yof presenting with Ultrasound  concerning for DVT LUE. Heparin per pharmacy consult placed for DVT, decision has been made to transition patient to enoxaparin.  Patient is on apixaban prior to arrival. Last dose 12/3 in the AM.   Patient was initially started on heparin drip and converted to enoxaparin 1mg /kg (70mg ) q12hr  DC planning  - enoxaparin comes in prefilled syringe 60mg  and 80mg  -  patient lives independently and can not see well enough to squeeze out medication to 70mg  mark. She also has marginal renal function wichh could cause accumulation of medication Discussion with provider and patient - safest option is to dc home on enoxparin 60mg  BID and monitor closely as outpatient   Hgb stable 9s plt stable 200s Estimated Creatinine Clearance: 40.5 mL/min (by C-G formula based on SCr of 0.98 mg/dL).  Goal of Therapy:  Monitor platelets by anticoagulation protocol: Yes   Plan:  Enoxaparin 70 mg q12hr while inpatient And change to 60mg  BID at DC  - pharmacy contacted they have medicaiton in stock and will order more for her as maintenance  Continue to monitor H&H and platelets   Bonnita Nasuti Pharm.D. CPP, BCPS Clinical Pharmacist [phone] 05/05/2021 9:21 AM

## 2021-05-05 NOTE — TOC Benefit Eligibility Note (Signed)
Findings of benefits investigation via test claims at [facility]:   Insurance: [employer]  $100 copay for Enoxaparin 60mg  BID for 30 days.

## 2021-05-05 NOTE — Progress Notes (Signed)
Pt ambulated in the hall (200 ft).  Pt tolerated well.   Lavenia Atlas, RN

## 2021-05-05 NOTE — Discharge Summary (Signed)
Physician Discharge Summary  Alexandra Kim QMG:[id number] DOB: 02-13-1935 DOA: 05/03/2021  PCP: [name], MD  Admit date: 05/03/2021 Discharge date: 05/05/2021  Admitted From: Home Disposition: Home  Recommendations for Outpatient Follow-up:  Follow up with PCP in 1-2 weeks Follow-up with cardiology as scheduled Follow-up with hematology as scheduled  Home Health: N/A Equipment/Devices: N/A  Discharge Condition: Stable CODE STATUS: Full code Diet recommendation: Low-salt diet  Discharge summary: 86 year old independent at home with history of hypertension, vitamin D deficiency and chronic leukocytosis who has been worked-up for leukocytosis by Dr. Bobby Rumpf at [place], recent diagnosis of paroxysmal A. fib with tachybradycardia syndrome status post pacemaker on 9/20 presented to [facility] with shortness of breath for 2 weeks and left upper extremity swelling for 3 days.  Seen by oncology office on 12/2.   CT angiogram was negative for pulmonary embolism.  Visible parenchyma with no evidence of lung tumors or abnormalities. WBC count elevated and mostly similar to previous. Upper extremity duplexes with acute and partially occlusive DVT left axillary vein in the axilla, brachial veins in the proximal upper arm along with significant forearm edema.     Assessment plan: Acute symptomatic upper extremity DVT in a patient who is therapeutic on Eliquis that she takes for paroxysmal A. fib. thromboembolism while already on Eliquis. -Rule out malignancy, patient is already following up with cancer center at [facility] where there is tentative plan for bone marrow biopsy. I have called and discussed with Dr Bobby Rumpf and discussed that underlying malignancy is less likely, however she will follow-up at cancer center to complete work-up. -Thromboembolism could have been triggered from pacemaker insertion?.  I discussed with her EP cardiology office.  She is a scheduled  follow-up on 12/7 at cardiology office in [place].  They will evaluate her for long-term anticoagulation. -Best treatment option for this patient is a Lovenox injection until we figure out final diagnosis.  We discussed in detail about going home with the Lovenox and following up with her hematologist oncologist. Injection techniques teaching to the patient and she is comfortable taking prefilled Lovenox syringe.  Discontinue Eliquis.  Lovenox 60 mg twice a day subcutaneous.  Verified with pharmacy about availability.  Patient also has family members that can support.  Continue other long-term medications.  Discharge Diagnoses:  Principal Problem:   Acute deep vein thrombosis (DVT) of left upper extremity (HCC) Active Problems:   Essential hypertension   GERD without esophagitis   AF (paroxysmal atrial fibrillation) (HCC)   Leukocytosis    Discharge Instructions  Discharge Instructions     Call MD for:  difficulty breathing, headache or visual disturbances   Complete by: As directed    Call MD for:  redness, tenderness, or signs of infection (pain, swelling, redness, odor or green/yellow discharge around incision site)   Complete by: As directed    Call MD for:  severe uncontrolled pain   Complete by: As directed    Diet - low sodium heart healthy   Complete by: As directed    Discharge instructions   Complete by: As directed    Keep left hand elevated in a pillow . Keep up the appointments with your cardiology and hematologist in [place]   Increase activity slowly   Complete by: As directed       Allergies as of 05/05/2021       Reactions   Ace Inhibitors Cough        Medication List  STOP taking these medications    apixaban 5 MG Tabs tablet Commonly known as: ELIQUIS       TAKE these medications    cholecalciferol 1000 units tablet Commonly known as: VITAMIN D Take 1,000 Units by mouth daily.   enoxaparin 60 MG/0.6ML injection Commonly known as:  LOVENOX Inject 0.6 mLs (60 mg total) into the skin 2 (two) times daily.   metoprolol succinate 50 MG 24 hr tablet Commonly known as: TOPROL-XL Take 1.5 tablets (75 mg total) by mouth at bedtime.   multivitamin-lutein Caps capsule Take 1 capsule by mouth in the morning and at bedtime.   pantoprazole 40 MG tablet Commonly known as: PROTONIX Take 40 mg by mouth daily as needed.   valsartan-hydrochlorothiazide 320-25 MG tablet Commonly known as: DIOVAN-HCT Take 1 tablet by mouth daily.        Follow-up Information     Marice Potter, MD Follow up.   Specialty: Oncology Why: office to schedule follow up Contact information: [place] [address] [phone]         Constance Haw, MD Follow up.   Specialty: Cardiology Why: keep up appointment this week Contact information: [place] [address] [phone]                Allergies  Allergen Reactions   Ace Inhibitors Cough    Consultations: None Phone consultation with her EP cardiology. Phone consultation with hematology oncology   Procedures/Studies: DG Chest 2 View  Result Date: 05/03/2021 CLINICAL DATA:  Chest pain. EXAM: CHEST - 2 VIEW COMPARISON:  March 24, 2021 FINDINGS: Cardiac pacemaker in stable position. Cardiomediastinal silhouette is normal. Mediastinal contours appear intact. There is no evidence of focal airspace consolidation, pleural effusion or pneumothorax. Minimal atelectasis versus scarring in the lung bases. Osseous structures are without acute abnormality. Soft tissues are grossly normal. IMPRESSION: No active cardiopulmonary disease. Electronically Signed   By: Fidela Salisbury M.D.   On: 05/03/2021 17:52   CT Angio Chest Pulmonary Embolism (PE) W or WO Contrast  Result Date: 05/04/2021 CLINICAL DATA:  Shortness of breath and upper extremity DVT EXAM: CT ANGIOGRAPHY CHEST WITH CONTRAST TECHNIQUE: Multidetector CT imaging of the  chest was performed using the standard protocol during bolus administration of intravenous contrast. Multiplanar CT image reconstructions and MIPs were obtained to evaluate the vascular anatomy. CONTRAST:  82m OMNIPAQUE IOHEXOL 350 MG/ML SOLN COMPARISON:  None. FINDINGS: Cardiovascular: Contrast injection is sufficient to demonstrate satisfactory opacification of the pulmonary arteries to the segmental level. There is no pulmonary embolus or evidence of right heart strain. The size of the main pulmonary artery is normal. Heart size is normal, with no pericardial effusion. The course and caliber of the aorta are normal. There is atherosclerotic calcification. No acute aortic syndrome. Mediastinum/Nodes: No mediastinal, hilar or axillary lymphadenopathy. Normal visualized thyroid. Thoracic esophageal course is normal. Lungs/Pleura: Airways are patent. No pleural effusion, lobar consolidation, pneumothorax or pulmonary infarction. Upper Abdomen: Contrast bolus timing is not optimized for evaluation of the abdominal organs. The visualized portions of the organs of the upper abdomen are normal. Musculoskeletal: No chest wall abnormality. No bony spinal canal stenosis. Review of the MIP images confirms the above findings. IMPRESSION: 1. No pulmonary embolus or acute aortic syndrome. Aortic Atherosclerosis (ICD10-I70.0). Electronically Signed   By: KUlyses JarredM.D.   On: 05/04/2021 02:59   UE VENOUS DUPLEX (7am - 7pm)  Result Date: 05/04/2021 UPPER VENOUS STUDY  Patient Name:  Alexandra Kim  Date of Exam:   05/03/2021 Medical Rec #: [id number]       Accession #:    [id number] Date of Birth: 1934-12-14        Patient Gender: F Patient Age:   18 years Exam Location:  [facility] Procedure:      VAS [place] UPPER EXTREMITY VENOUS DUPLEX Referring Phys: GRACE LOEFFLER --------------------------------------------------------------------------------  Indications: Pain, Swelling, and SOB Risk Factors: Pacemaker placement  02/18/2021. Anticoagulation: Eliquis. Limitations: Edema. Comparison Study: No prior study on file Performing Technologist: Sharion Dove RVS  Examination Guidelines: A complete evaluation includes B-mode imaging, spectral Doppler, color Doppler, and power Doppler as needed of all accessible portions of each vessel. Bilateral testing is considered an integral part of a complete examination. Limited examinations for reoccurring indications may be performed as noted.  Right Findings: +----------+------------+---------+-----------+----------+-------+ RIGHT     CompressiblePhasicitySpontaneousPropertiesSummary +----------+------------+---------+-----------+----------+-------+ Subclavian               Yes       Yes                      +----------+------------+---------+-----------+----------+-------+  Left Findings: +----------+------------+---------+-----------+----------+-------+ LEFT      CompressiblePhasicitySpontaneousPropertiesSummary +----------+------------+---------+-----------+----------+-------+ IJV                      Yes       Yes                      +----------+------------+---------+-----------+----------+-------+ Subclavian               Yes       Yes                      +----------+------------+---------+-----------+----------+-------+ Axillary    Partial      No        No                Acute  +----------+------------+---------+-----------+----------+-------+ Brachial    Partial      No        No                Acute  +----------+------------+---------+-----------+----------+-------+ Radial        Full                                          +----------+------------+---------+-----------+----------+-------+ Ulnar         Full                                          +----------+------------+---------+-----------+----------+-------+ Cephalic      Full                                           +----------+------------+---------+-----------+----------+-------+ Basilic       Full                                          +----------+------------+---------+-----------+----------+-------+  Summary:  Right: No evidence of thrombosis in the subclavian.  Left: No evidence  of superficial vein thrombosis in the upper extremity. Findings consistent with acute deep vein thrombosis involving the left axillary vein and left brachial veins. There is acute, partially occlusive DVT noted in the axillary vein at the axilla. It does note extend into the chest. There is partially occlusive acute DVT noted in one of the paired brachial veins in the proximal upper arm, extending into the confluence with the axillary vein in the axilla. Significant edema noted throughout the left forearm, seemingly not related to the DVT.  *See table(s) above for measurements and observations.  Diagnosing physician: Deitra Mayo MD Electronically signed by Deitra Mayo MD on 05/04/2021 at 12:03:22 PM.    Final    (Echo, Carotid, EGD, Colonoscopy, ERCP)    Subjective: Patient seen and examined at the bedside.  She had some dizzy episodes yesterday after the injection but since then she has been walking around and feels okay. Left arm is somehow more swollen than earlier.  Mild discomfort.   Discharge Exam: Vitals:   05/05/21 0821 05/05/21 1109  BP: 119/82 (!) 141/70  Pulse: 65 64  Resp: 15 16  Temp: 97.9 F (36.6 C) 98 F (36.7 C)  SpO2: 97% 98%   Vitals:   05/05/21 0046 05/05/21 0300 05/05/21 0821 05/05/21 1109  BP: (!) 131/111 (!) 135/105 119/82 (!) 141/70  Pulse: 73 66 65 64  Resp: _0 Temp: 97.6 F (36.4 C) 98.1 F (36.7 C) 97.9 F (36.6 C) 98 F (36.7 C)  TempSrc: Oral Oral Oral Oral  SpO2: 94% 97% 97% 98%  Weight:      Height:        General: Pt is alert, awake, not in acute distress Sitting in chair.  Not in any distress. Cardiovascular: RRR, S1/S2 +, no rubs, no gallops,  pacemaker present. Respiratory: CTA bilaterally, no wheezing, no rhonchi Abdominal: Soft, NT, ND, bowel sounds + Extremities:  Left arm and forearm with edema, nontender.  Distal wrist pulses are normal. Small ecchymotic area left medial aspect of the biceps area, nontender and no evidence of bleeding.    The results of significant diagnostics from this hospitalization (including imaging, microbiology, ancillary and laboratory) are listed below for reference.     Microbiology: Recent Results (from the past 240 hour(s))  Resp Panel by RT-PCR (Flu A&B, Covid) Nasopharyngeal Swab     Status: None   Collection Time: 05/03/21  8:43 PM   Specimen: Nasopharyngeal Swab; Nasopharyngeal(NP) swabs in vial transport medium  Result Value Ref Range Status   SARS Coronavirus 2 by RT PCR NEGATIVE NEGATIVE Final    Comment: (NOTE) SARS-CoV-2 target nucleic acids are NOT DETECTED.  The SARS-CoV-2 RNA is generally detectable in upper respiratory specimens during the acute phase of infection. The lowest concentration of SARS-CoV-2 viral copies this assay can detect is 138 copies/mL. A negative result does not preclude SARS-Cov-2 infection and should not be used as the sole basis for treatment or other patient management decisions. A negative result may occur with  improper specimen collection/handling, submission of specimen other than nasopharyngeal swab, presence of viral mutation(s) within the areas targeted by this assay, and inadequate number of viral copies(<138 copies/mL). A negative result must be combined with clinical observations, patient history, and epidemiological information. The expected result is Negative.  Fact Sheet for Patients:  EntrepreneurPulse.com.au  Fact Sheet for Healthcare Providers:  IncredibleEmployment.be  This test is no t yet approved or cleared by the [place] and  has been authorized  for detection and/or diagnosis of  SARS-CoV-2 by FDA under an Emergency Use Authorization (EUA). This EUA will remain  in effect (meaning this test can be used) for the duration of the COVID-19 declaration under Section 564(b)(1) of the Act, 21 U.S.C.section 360bbb-3(b)(1), unless the authorization is terminated  or revoked sooner.       Influenza A by PCR NEGATIVE NEGATIVE Final   Influenza B by PCR NEGATIVE NEGATIVE Final    Comment: (NOTE) The Xpert Xpress SARS-CoV-2/FLU/RSV plus assay is intended as an aid in the diagnosis of influenza from Nasopharyngeal swab specimens and should not be used as a sole basis for treatment. Nasal washings and aspirates are unacceptable for Xpert Xpress SARS-CoV-2/FLU/RSV testing.  Fact Sheet for Patients: EntrepreneurPulse.com.au  Fact Sheet for Healthcare Providers: IncredibleEmployment.be  This test is not yet approved or cleared by the [place] FDA and has been authorized for detection and/or diagnosis of SARS-CoV-2 by FDA under an Emergency Use Authorization (EUA). This EUA will remain in effect (meaning this test can be used) for the duration of the COVID-19 declaration under Section 564(b)(1) of the Act, 21 U.S.C. section 360bbb-3(b)(1), unless the authorization is terminated or revoked.  Performed at [facility], [place] [address] [place], [place] [address]      Labs: BNP (last 3 results) No results for input(s): BNP in the last 8760 hours. Basic Metabolic Panel: Recent Labs  Lab 05/03/21 2300 05/04/21 0537  NA 137 135  K 3.7 3.7  CL 102 99  CO2 27 27  GLUCOSE 127* 108*  BUN 24* 21  CREATININE 1.09* 0.98  CALCIUM 9.4 9.0  MG  --  1.8   Liver Function Tests: Recent Labs  Lab 05/04/21 0537  AST 17  ALT 11  ALKPHOS 48  BILITOT 0.9  PROT 5.9*  ALBUMIN 3.4*   No results for input(s): LIPASE, AMYLASE in the last 168 hours. No results for input(s): AMMONIA in the last 168 hours. CBC: Recent Labs  Lab  05/02/21 0000 05/03/21 2300 05/04/21 0537  WBC 19.2 18.6* 14.1*  NEUTROABS 15.74  --  10.7*  HGB 10.2* 9.6* 9.4*  HCT 31* 29.2* 28.6*  MCV  --  102.1* 101.8*  PLT 291 286 286   Cardiac Enzymes: No results for input(s): CKTOTAL, CKMB, CKMBINDEX, TROPONINI in the last 168 hours. BNP: Invalid input(s): POCBNP CBG: No results for input(s): GLUCAP in the last 168 hours. D-Dimer No results for input(s): DDIMER in the last 72 hours. Hgb A1c No results for input(s): HGBA1C in the last 72 hours. Lipid Profile No results for input(s): CHOL, HDL, LDLCALC, TRIG, CHOLHDL, LDLDIRECT in the last 72 hours. Thyroid function studies No results for input(s): TSH, T4TOTAL, T3FREE, THYROIDAB in the last 72 hours.  Invalid input(s): FREET3 Anemia work up Recent Labs    05/02/21 1333  VITAMINB12 491  FOLATE 6.3  FERRITIN 176  TIBC 293  IRON 63   Urinalysis    Component Value Date/Time   COLORURINE YELLOW 05/03/2021 2030   [place] 05/03/2021 2030   [place] 1.020 05/03/2021 2030   PHURINE 5.0 05/03/2021 2030   GLUCOSEU NEGATIVE 05/03/2021 2030   [place] NEGATIVE 05/03/2021 2030   [place] NEGATIVE 05/03/2021 2030   [place] 05/03/2021 2030   PROTEINUR NEGATIVE 05/03/2021 2030   NITRITE NEGATIVE 05/03/2021 2030   LEUKOCYTESUR NEGATIVE 05/03/2021 2030   Sepsis Labs Invalid input(s): PROCALCITONIN,  WBC,  LACTICIDVEN Microbiology Recent Results (from the past 240 hour(s))  Resp Panel by RT-PCR (  Flu A&B, Covid) Nasopharyngeal Swab     Status: None   Collection Time: 05/03/21  8:43 PM   Specimen: Nasopharyngeal Swab; Nasopharyngeal(NP) swabs in vial transport medium  Result Value Ref Range Status   SARS Coronavirus 2 by RT PCR NEGATIVE NEGATIVE Final    Comment: (NOTE) SARS-CoV-2 target nucleic acids are NOT DETECTED.  The SARS-CoV-2 RNA is generally detectable in upper respiratory specimens during the acute phase of infection. The lowest concentration of  SARS-CoV-2 viral copies this assay can detect is 138 copies/mL. A negative result does not preclude SARS-Cov-2 infection and should not be used as the sole basis for treatment or other patient management decisions. A negative result may occur with  improper specimen collection/handling, submission of specimen other than nasopharyngeal swab, presence of viral mutation(s) within the areas targeted by this assay, and inadequate number of viral copies(<138 copies/mL). A negative result must be combined with clinical observations, patient history, and epidemiological information. The expected result is Negative.  Fact Sheet for Patients:  EntrepreneurPulse.com.au  Fact Sheet for Healthcare Providers:  IncredibleEmployment.be  This test is no t yet approved or cleared by the [place] FDA and  has been authorized for detection and/or diagnosis of SARS-CoV-2 by FDA under an Emergency Use Authorization (EUA). This EUA will remain  in effect (meaning this test can be used) for the duration of the COVID-19 declaration under Section 564(b)(1) of the Act, 21 U.S.C.section 360bbb-3(b)(1), unless the authorization is terminated  or revoked sooner.       Influenza A by PCR NEGATIVE NEGATIVE Final   Influenza B by PCR NEGATIVE NEGATIVE Final    Comment: (NOTE) The Xpert Xpress SARS-CoV-2/FLU/RSV plus assay is intended as an aid in the diagnosis of influenza from Nasopharyngeal swab specimens and should not be used as a sole basis for treatment. Nasal washings and aspirates are unacceptable for Xpert Xpress SARS-CoV-2/FLU/RSV testing.  Fact Sheet for Patients: EntrepreneurPulse.com.au  Fact Sheet for Healthcare Providers: IncredibleEmployment.be  This test is not yet approved or cleared by the [place] FDA and has been authorized for detection and/or diagnosis of SARS-CoV-2 by FDA under an Emergency Use  Authorization (EUA). This EUA will remain in effect (meaning this test can be used) for the duration of the COVID-19 declaration under Section 564(b)(1) of the Act, 21 U.S.C. section 360bbb-3(b)(1), unless the authorization is terminated or revoked.  Performed at [facility], [place] [address] [place], [place] [address]      Time coordinating discharge:  35 minutes  SIGNED:   Barb Merino, MD  [facility] 05/05/2021, 11:30 AM

## 2021-05-06 LAB — MULTIPLE MYELOMA PANEL, SERUM
Albumin SerPl Elph-Mcnc: 3.7 g/dL (ref 2.9–4.4)
Albumin/Glob SerPl: 1.4 (ref 0.7–1.7)
Alpha 1: 0.3 g/dL (ref 0.0–0.4)
Alpha2 Glob SerPl Elph-Mcnc: 0.6 g/dL (ref 0.4–1.0)
B-Globulin SerPl Elph-Mcnc: 0.7 g/dL (ref 0.7–1.3)
Gamma Glob SerPl Elph-Mcnc: 1.2 g/dL (ref 0.4–1.8)
Globulin, Total: 2.8 g/dL (ref 2.2–3.9)
IgA: 146 mg/dL (ref 64–422)
IgG (Immunoglobin G), Serum: 604 mg/dL (ref 586–1602)
IgM (Immunoglobulin M), Srm: 805 mg/dL — ABNORMAL HIGH (ref 26–217)
M Protein SerPl Elph-Mcnc: 0.5 g/dL — ABNORMAL HIGH
Total Protein ELP: 6.5 g/dL (ref 6.0–8.5)

## 2021-05-07 ENCOUNTER — Encounter: Payer: Self-pay | Admitting: Cardiology

## 2021-05-07 ENCOUNTER — Other Ambulatory Visit: Payer: Self-pay

## 2021-05-07 ENCOUNTER — Ambulatory Visit (INDEPENDENT_AMBULATORY_CARE_PROVIDER_SITE_OTHER): Payer: Medicare Other | Admitting: Cardiology

## 2021-05-07 VITALS — BP 110/70 | HR 76 | Ht 63.0 in | Wt 158.0 lb

## 2021-05-07 DIAGNOSIS — I495 Sick sinus syndrome: Secondary | ICD-10-CM | POA: Diagnosis not present

## 2021-05-07 DIAGNOSIS — R0602 Shortness of breath: Secondary | ICD-10-CM

## 2021-05-07 LAB — PROTEIN ELECTROPHORESIS, SERUM
A/G Ratio: 1.4 (ref 0.7–1.7)
Albumin ELP: 3.8 g/dL (ref 2.9–4.4)
Alpha-1-Globulin: 0.2 g/dL (ref 0.0–0.4)
Alpha-2-Globulin: 0.6 g/dL (ref 0.4–1.0)
Beta Globulin: 0.7 g/dL (ref 0.7–1.3)
Gamma Globulin: 1.1 g/dL (ref 0.4–1.8)
Globulin, Total: 2.7 g/dL (ref 2.2–3.9)
M-Spike, %: 0.4 g/dL — ABNORMAL HIGH
Total Protein ELP: 6.5 g/dL (ref 6.0–8.5)

## 2021-05-07 NOTE — Patient Instructions (Signed)
Medication Instructions:  Your physician recommends that you continue on your current medications as directed. Please refer to the Current Medication list given to you today.  *If you need a refill on your cardiac medications before your next appointment, please call your pharmacy*   Lab Work: Today: BNP If you have labs (blood work) drawn today and your tests are completely normal, you will receive your results only by: [place] (if you have MyChart) OR A paper copy in the mail If you have any lab test that is abnormal or we need to change your treatment, we will call you to review the results.   Testing/Procedures: None ordered   Follow-Up: At [facility], you and your health needs are our priority.  As part of our continuing mission to provide you with exceptional heart care, we have created designated Provider Care Teams.  These Care Teams include your primary Cardiologist (physician) and Advanced Practice Providers (APPs -  Physician Assistants and Nurse Practitioners) who all work together to provide you with the care you need, when you need it.  Remote monitoring is used to monitor your Pacemaker or ICD from home. This monitoring reduces the number of office visits required to check your device to one time per year. It allows [place] to keep an eye on the functioning of your device to ensure it is working properly. You are scheduled for a device check from home on 05/21/2021. You may send your transmission at any time that day. If you have a wireless device, the transmission will be sent automatically. After your physician reviews your transmission, you will receive a postcard with your next transmission date.  Your next appointment:   1 year(s)  The format for your next appointment:   In Person  Provider:   Allegra Lai, MD   Thank you for choosing [facility]!!   Trinidad Curet, RN [phone]    Other Instructions

## 2021-05-07 NOTE — Progress Notes (Signed)
Electrophysiology Office Note   Date:  05/07/2021   ID:  Alexandra Kim, DOB 1935/03/18, MRN [id number]  PCP:  Alexandra Dress, MD  Cardiologist:   Primary Electrophysiologist:  Alexandra Dermody Meredith Leeds, MD    Chief Complaint: pacemaker   History of Present Illness: Alexandra Kim is a 86 y.o. female who is being seen today for the evaluation of pacemaker at the request of Alexandra Dress, MD. Presenting today for electrophysiology evaluation.  She has a history significant for hypertension, hyperlipidemia.  She presented to the hospital 02/18/2021 in rapid atrial fibrillation, having pauses.  She had been having dizzy spells that she attributed to inner ear problems.  Due to her pauses, she is status post Abbott dual-chamber pacemaker implanted 02/18/2021.  She presented to the hospital 05/03/2021 with shortness of breath and left upper extremity swelling.  She was found to have a left lower extremity DVT.  Eliquis was stopped and she was started on Lovenox.  Today, she denies symptoms of palpitations, chest pain, orthopnea, PND, lower extremity edema, claudication, dizziness, presyncope, syncope, bleeding, or neurologic sequela. The patient is tolerating medications without difficulties.  She presents to clinic today with continued shortness of breath.  She is short of breath when walking short distances.  Her shortness of breath started after her pacemaker was implanted, and was not helped when she was in the hospital.  She states that she has lost up to 30 pounds.  She is quite frustrated as she continues to be short of breath.   Past Medical History:  Diagnosis Date   Chronic venous insufficiency 02/13/2019   Hyperlipidemia    Hypertension    Osteopenia    Varicose veins    Vitamin D deficiency    Past Surgical History:  Procedure Laterality Date   ABDOMINAL HYSTERECTOMY  1990   PACEMAKER IMPLANT N/A 02/18/2021   Procedure: PACEMAKER IMPLANT;  Surgeon: Thompson Grayer, MD;   Location: [facility];  Service: Cardiovascular;  Laterality: N/A;     Current Outpatient Medications  Medication Sig Dispense Refill   cholecalciferol (VITAMIN D) 1000 UNITS tablet Take 1,000 Units by mouth daily.     enoxaparin (LOVENOX) 60 MG/0.6ML injection Inject 0.6 mLs (60 mg total) into the skin 2 (two) times daily. 60 mL 5   metoprolol succinate (TOPROL-XL) 50 MG 24 hr tablet Take 1.5 tablets (75 mg total) by mouth at bedtime. 45 tablet 6   multivitamin-lutein (OCUVITE-LUTEIN) CAPS capsule Take 1 capsule by mouth in the morning and at bedtime.     pantoprazole (PROTONIX) 40 MG tablet Take 40 mg by mouth daily as needed.     valsartan-hydrochlorothiazide (DIOVAN-HCT) 320-25 MG tablet Take 1 tablet by mouth daily.     No current facility-administered medications for this visit.    Allergies:   Ace inhibitors   Social History:  The patient  reports that she quit smoking about 14 years ago. Her smoking use included cigarettes. She has never used smokeless tobacco. She reports that she does not drink alcohol and does not use drugs.   Family History:  The patient's family history includes Breast cancer in her sister; Diabetes in her mother; Heart disease in her mother; Hypertension in her mother; Lung cancer in her father.    ROS:  Please see the history of present illness.   Otherwise, review of systems is positive for none.   All other systems are reviewed and negative.    PHYSICAL EXAM: VS:  BP 110/70 (  BP Location: Right Arm, Patient Position: Sitting, Cuff Size: Normal)   Pulse 76   Ht 5\' 3"  (1.6 m)   Wt 158 lb (71.7 kg)   SpO2 98%   BMI 27.99 kg/m  , BMI Body mass index is 27.99 kg/m. GEN: Well nourished, well developed, in no acute distress  HEENT: normal  Neck: no JVD, carotid bruits, or masses Cardiac: RRR; no murmurs, rubs, or gallops,no edema  Respiratory:  clear to auscultation bilaterally, normal work of breathing GI: soft, nontender, nondistended, +  BS MS: no deformity or atrophy  Skin: warm and dry, device pocket is well healed Neuro:  Strength and sensation are intact Psych: euthymic mood, full affect  EKG:  EKG is ordered today. Personal review of the ekg ordered shows sinus rhythm, left anterior fascicular block, LVH  Device interrogation is reviewed today in detail.  See PaceArt for details.   Recent Labs: 02/18/2021: TSH 3.279 05/04/2021: ALT 11; BUN 21; Creatinine, Ser 0.98; Hemoglobin 9.4; Magnesium 1.8; Platelets 286; Potassium 3.7; Sodium 135    Lipid Panel     Component Value Date/Time   CHOL 97 02/19/2021 0212   TRIG 82 02/19/2021 0212   HDL 29 (L) 02/19/2021 0212   CHOLHDL 3.3 02/19/2021 0212   VLDL 16 02/19/2021 0212   LDLCALC 52 02/19/2021 0212     Wt Readings from Last 3 Encounters:  05/07/21 158 lb (71.7 kg)  05/03/21 157 lb 3 oz (71.3 kg)  05/02/21 157 lb (71.2 kg)      Other studies Reviewed: Additional studies/ records that were reviewed today include: TTE 02/18/21  Review of the above records today demonstrates:   1. Left ventricular ejection fraction, by estimation, is 60 to 65%. The  left ventricle has normal function. The left ventricle has no regional  wall motion abnormalities. Left ventricular diastolic function could not  be evaluated.   2. Right ventricular systolic function is normal. The right ventricular  size is normal. There is mildly elevated pulmonary artery systolic  pressure. The estimated right ventricular systolic pressure is 49.7 mmHg.   3. The mitral valve is normal in structure. Trivial mitral valve  regurgitation. No evidence of mitral stenosis.   4. The aortic valve is normal in structure. Aortic valve regurgitation is  not visualized. No aortic stenosis is present.   5. Aortic dilatation noted. There is mild dilatation of the ascending  aorta, measuring 38 mm.   6. The inferior vena cava is dilated in size with <50% respiratory  variability, suggesting right atrial  pressure of 15 mmHg.    ASSESSMENT AND PLAN:  1.  Tachybradycardia syndrome: Status post Abbott dual-chamber pacemaker implanted 02/18/2021.  Device functioning appropriately.  No changes at this time.  2.  Paroxysmal atrial fibrillation: Presented to the hospital with rapid atrial fibrillation and pauses.  Was on Eliquis but has been switched to Lovenox with new left upper extremity DVT.  CHA2DS2-VASc of 4.  3.  Left upper extremity DVT: Potentially as a consequence of her pacemaker.  She is currently on Lovenox.  She got the DVT while on Eliquis.  She does have follow-up planned with the cancer center in [place].  If the cancer center feels that she no longer leans Lovenox, she Yonael Tulloch need anticoagulation for atrial fibrillation.  4.  Shortness of breath: Unclear as to the cause of her shortness of breath.  Her ejection fraction is normal, but she did not have diastolic dysfunction evaluated on her echo.  She was recently  in the hospital with a normal creatinine.  We Byanka Landrus check a BNP today to determine if volume is her issue.  Current medicines are reviewed at length with the patient today.   The patient does not have concerns regarding her medicines.  The following changes were made today:  none  Labs/ tests ordered today include:  Orders Placed This Encounter  Procedures   Pro b natriuretic peptide (BNP)      Disposition:   FU with Jayd Cadieux 1 year  Signed, Annalissa Murphey Meredith Leeds, MD  05/07/2021 3:23 PM     [facility] [address] [place] [address] [phone] (office) [phone] (fax)

## 2021-05-08 DIAGNOSIS — H353221 Exudative age-related macular degeneration, left eye, with active choroidal neovascularization: Secondary | ICD-10-CM | POA: Diagnosis not present

## 2021-05-08 DIAGNOSIS — H353112 Nonexudative age-related macular degeneration, right eye, intermediate dry stage: Secondary | ICD-10-CM | POA: Diagnosis not present

## 2021-05-08 LAB — PRO B NATRIURETIC PEPTIDE: NT-Pro BNP: 541 pg/mL (ref 0–738)

## 2021-05-12 DIAGNOSIS — D72829 Elevated white blood cell count, unspecified: Secondary | ICD-10-CM | POA: Diagnosis not present

## 2021-05-12 DIAGNOSIS — D649 Anemia, unspecified: Secondary | ICD-10-CM | POA: Diagnosis not present

## 2021-05-12 DIAGNOSIS — I480 Paroxysmal atrial fibrillation: Secondary | ICD-10-CM | POA: Diagnosis not present

## 2021-05-12 DIAGNOSIS — I495 Sick sinus syndrome: Secondary | ICD-10-CM | POA: Diagnosis not present

## 2021-05-12 DIAGNOSIS — I82622 Acute embolism and thrombosis of deep veins of left upper extremity: Secondary | ICD-10-CM | POA: Diagnosis not present

## 2021-05-21 ENCOUNTER — Ambulatory Visit (INDEPENDENT_AMBULATORY_CARE_PROVIDER_SITE_OTHER): Payer: Medicare Other

## 2021-05-21 DIAGNOSIS — I495 Sick sinus syndrome: Secondary | ICD-10-CM

## 2021-05-21 LAB — CUP PACEART REMOTE DEVICE CHECK
Battery Remaining Longevity: 122 mo
Battery Remaining Percentage: 95.5 %
Battery Voltage: 3.04 V
Brady Statistic AP VP Percent: 1 %
Brady Statistic AP VS Percent: 29 %
Brady Statistic AS VP Percent: 0 %
Brady Statistic AS VS Percent: 71 %
Brady Statistic RA Percent Paced: 28 %
Brady Statistic RV Percent Paced: 1 %
Date Time Interrogation Session: 20221221040014
Implantable Lead Implant Date: 20220920
Implantable Lead Implant Date: 20220920
Implantable Lead Location: 753859
Implantable Lead Location: 753860
Implantable Pulse Generator Implant Date: 20220920
Lead Channel Impedance Value: 460 Ohm
Lead Channel Impedance Value: 490 Ohm
Lead Channel Pacing Threshold Amplitude: 0.5 V
Lead Channel Pacing Threshold Amplitude: 0.75 V
Lead Channel Pacing Threshold Pulse Width: 0.5 ms
Lead Channel Pacing Threshold Pulse Width: 0.5 ms
Lead Channel Sensing Intrinsic Amplitude: 12 mV
Lead Channel Sensing Intrinsic Amplitude: 3.9 mV
Lead Channel Setting Pacing Amplitude: 2 V
Lead Channel Setting Pacing Amplitude: 2.5 V
Lead Channel Setting Pacing Pulse Width: 0.5 ms
Lead Channel Setting Sensing Sensitivity: 2 mV
Pulse Gen Model: 2272
Pulse Gen Serial Number: 3963600

## 2021-05-27 ENCOUNTER — Encounter: Payer: Medicare Other | Admitting: Internal Medicine

## 2021-05-28 NOTE — Progress Notes (Signed)
[facility]  [address] [place],  [place]  [address] [phone]  Clinic Day:  06/03/2021  Referring physician: [name], MD  This document serves as a record of services personally performed by Varian Innes Macarthur Critchley, MD. It was created on their behalf by [facility] E, a trained medical scribe. The creation of this record is based on the scribe's personal observations and the provider's statements to them.  HISTORY OF PRESENT ILLNESS:  The patient is a 86 y.o. female  who I recently began seeing for both leukocytosis and anemia.  She comes in today to reassess her peripheral counts, as well as to review recent labs done to discern the etiology behind her cytopenias.  She continues to complain of increased fatigue and occasional dizziness.  Of note, since her last visit, a Doppler ultrasound did reveal a DVT in her left arm.  The presumed etiology behind this is thought to be related to either her pacemaker placement procedure or a possible underlying hematologic disorder  PHYSICAL EXAM:  Blood pressure 131/71, pulse 64, temperature 98.2 F (36.8 C), resp. rate 16, height $RemoveBe'5\' 3"'[id number]$  (1.6 m), weight 154 lb 3.2 oz (69.9 kg), SpO2 94 %. Wt Readings from Last 3 Encounters:  06/03/21 154 lb 3.2 oz (69.9 kg)  05/07/21 158 lb (71.7 kg)  05/03/21 157 lb 3 oz (71.3 kg)   Body mass index is 27.32 kg/m. Performance status (ECOG): 1 - Symptomatic but completely ambulatory Physical Exam Constitutional:      Appearance: Normal appearance. She is not ill-appearing.  HENT:     Mouth/Throat:     Mouth: Mucous membranes are moist.     Pharynx: Oropharynx is clear. No oropharyngeal exudate or posterior oropharyngeal erythema.  Cardiovascular:     Rate and Rhythm: Normal rate and regular rhythm.     Heart sounds: No murmur heard.   No friction rub. No gallop.  Pulmonary:     Effort: Pulmonary effort is normal. No respiratory distress.     Breath sounds:  Normal breath sounds. No wheezing, rhonchi or rales.  Abdominal:     General: Bowel sounds are normal. There is no distension.     Palpations: Abdomen is soft. There is no mass.     Tenderness: There is no abdominal tenderness.  Musculoskeletal:        General: No swelling.     Right lower leg: No edema.     Left lower leg: No edema.  Lymphadenopathy:     Cervical: No cervical adenopathy.     Upper Body:     Right upper body: No supraclavicular or axillary adenopathy.     Left upper body: No supraclavicular or axillary adenopathy.     Lower Body: No right inguinal adenopathy. No left inguinal adenopathy.  Skin:    General: Skin is warm.     Coloration: Skin is not jaundiced.     Findings: No lesion or rash.  Neurological:     General: No focal deficit present.     Mental Status: She is alert and oriented to person, place, and time. Mental status is at baseline.  Psychiatric:        Mood and Affect: Mood normal.        Behavior: Behavior normal.        Thought Content: Thought content normal.    LABS:    CBC Latest Ref Rng & Units 06/03/2021 05/04/2021 05/03/2021  WBC - 16.0 14.1(H) 18.6(H)  Hemoglobin  12.0 - 16.0 10.8(A) 9.4(L) 9.6(L)  Hematocrit 36 - 46 33(A) 28.6(L) 29.2(L)  Platelets 150 - 399 297 286 286   CMP Latest Ref Rng & Units 05/04/2021 05/03/2021 02/26/2021  Glucose 70 - 99 mg/dL 108(H) 127(H) 88  BUN 8 - 23 mg/dL 21 24(H) 16  Creatinine 0.44 - 1.00 mg/dL 0.98 1.09(H) 0.94  Sodium 135 - 145 mmol/L 135 137 137  Potassium 3.5 - 5.1 mmol/L 3.7 3.7 4.7  Chloride 98 - 111 mmol/L 99 102 97  CO2 22 - 32 mmol/L _0 Calcium 8.9 - 10.3 mg/dL 9.0 9.4 9.4  Total Protein 6.5 - 8.1 g/dL 5.9(L) - -  Total Bilirubin 0.3 - 1.2 mg/dL 0.9 - -  Alkaline Phos 38 - 126 U/L 48 - -  AST 15 - 41 U/L 17 - -  ALT 0 - 44 U/L 11 - -    Latest Reference Range & Units 05/02/21 13:33  Globulin, Total 2.2 - 3.9 g/dL 2.7 (C)  A/G Ratio 0.7 - 1.7  1.4 (C)  Alpha-1-Globulin 0.0 - 0.4  g/dL 0.2  Alpha-2-Globulin 0.4 - 1.0 g/dL 0.6  Beta Globulin 0.7 - 1.3 g/dL 0.7  Gamma Globulin 0.4 - 1.8 g/dL 1.1  M-SPIKE, % Not Observed g/dL 0.4 (H)  SPE Interp.  Comment   Comment: (NOTE)  The SPE pattern demonstrates a single peak (M-spike) in the gamma  region which may represent monoclonal protein. This peak may also  be caused by circulating immune complexes, cryoglobulins, C-reactive  protein, fibrinogen or hemolysis.  If clinically indicated, the  presence of a monoclonal gammopathy may be confirmed by immuno-  fixation, as well as an evaluation of the urine for the presence of  Bence-Jones protein.     Latest Reference Range & Units 05/02/21 14:53  IgG (Immunoglobin G), Serum 586 - 1,602 mg/dL 604  IgM (Immunoglobulin M), Srm 26 - 217 mg/dL 805 (H)  IgA 64 - 422 mg/dL 146  Kappa free light chain 3.3 - 19.4 mg/L 20.6 (H)  Lambda free light chains 5.7 - 26.3 mg/L 20.5  Kappa, lambda light chain ratio 0.26 - 1.65  1.00  (H): Data is abnormally high  ASSESSMENT & PLAN:  An 86 y.o. female with leukocytosis and anemia.  I am pleased as both her leukocytosis and anemia have improved over time.  However, recent labs suggest there may be a potentially underlying plasma cell dyscrasia present.  The patient has a positive M spike.  Furthermore, her IgM level is nearly 4 times the upper limit of normal.  When factoring these values in with her abnormal peripheral counts, I am concerned this patient may have underlying lymphoplasmacytic lymphoma.  To prove this, a bone marrow biopsy will be done later this week.  I will see her back 2 weeks later to go over her bone marrow biopsy results and their implications.  The patient understands all the plans discussed today and is in agreement with them.  I, Rita Ohara, am acting as scribe for Marice Potter, MD    I have reviewed this report as typed by the medical scribe, and it is complete and accurate.  Milbert Bixler Macarthur Critchley, MD

## 2021-05-30 NOTE — Progress Notes (Signed)
Remote pacemaker transmission.   

## 2021-06-03 ENCOUNTER — Other Ambulatory Visit: Payer: Self-pay | Admitting: Hematology and Oncology

## 2021-06-03 ENCOUNTER — Other Ambulatory Visit: Payer: Self-pay | Admitting: Oncology

## 2021-06-03 ENCOUNTER — Inpatient Hospital Stay: Payer: Medicare Other

## 2021-06-03 ENCOUNTER — Inpatient Hospital Stay: Payer: Medicare Other | Attending: Oncology | Admitting: Oncology

## 2021-06-03 DIAGNOSIS — D72829 Elevated white blood cell count, unspecified: Secondary | ICD-10-CM | POA: Diagnosis not present

## 2021-06-03 DIAGNOSIS — D649 Anemia, unspecified: Secondary | ICD-10-CM | POA: Insufficient documentation

## 2021-06-03 DIAGNOSIS — D472 Monoclonal gammopathy: Secondary | ICD-10-CM | POA: Diagnosis not present

## 2021-06-03 HISTORY — DX: Monoclonal gammopathy: D47.2

## 2021-06-03 LAB — CBC AND DIFFERENTIAL
HCT: 33 — AB (ref 36–46)
Hemoglobin: 10.8 — AB (ref 12.0–16.0)
Neutrophils Absolute: 12.96
Platelets: 297 (ref 150–399)
WBC: 16

## 2021-06-03 LAB — CBC: RBC: 3.31 — AB (ref 3.87–5.11)

## 2021-06-05 ENCOUNTER — Inpatient Hospital Stay (INDEPENDENT_AMBULATORY_CARE_PROVIDER_SITE_OTHER): Payer: Medicare Other | Admitting: Oncology

## 2021-06-05 ENCOUNTER — Telehealth: Payer: Self-pay | Admitting: Oncology

## 2021-06-05 ENCOUNTER — Other Ambulatory Visit: Payer: Self-pay

## 2021-06-05 ENCOUNTER — Other Ambulatory Visit: Payer: Self-pay | Admitting: Oncology

## 2021-06-05 ENCOUNTER — Inpatient Hospital Stay: Payer: Medicare Other

## 2021-06-05 ENCOUNTER — Encounter: Payer: Self-pay | Admitting: Oncology

## 2021-06-05 VITALS — BP 136/63 | HR 65 | Temp 98.6°F | Resp 16 | Ht 63.0 in | Wt 154.3 lb

## 2021-06-05 DIAGNOSIS — D472 Monoclonal gammopathy: Secondary | ICD-10-CM

## 2021-06-05 DIAGNOSIS — D649 Anemia, unspecified: Secondary | ICD-10-CM | POA: Diagnosis not present

## 2021-06-05 DIAGNOSIS — D72829 Elevated white blood cell count, unspecified: Secondary | ICD-10-CM | POA: Diagnosis not present

## 2021-06-05 DIAGNOSIS — R768 Other specified abnormal immunological findings in serum: Secondary | ICD-10-CM | POA: Diagnosis not present

## 2021-06-05 DIAGNOSIS — C9290 Myeloid leukemia, unspecified, not having achieved remission: Secondary | ICD-10-CM | POA: Diagnosis not present

## 2021-06-05 LAB — CBC AND DIFFERENTIAL
HCT: 32 — AB (ref 36–46)
Hemoglobin: 10.7 — AB (ref 12.0–16.0)
Neutrophils Absolute: 12.45
Platelets: 306 (ref 150–399)
WBC: 15

## 2021-06-05 LAB — CBC: RBC: 3.22 — AB (ref 3.87–5.11)

## 2021-06-05 NOTE — Telephone Encounter (Signed)
Per 1/5 los next appt scheduled and given to patient °

## 2021-06-05 NOTE — Progress Notes (Signed)
Due to anemia, a positive M-spike and an elevated IgM level, a bone marrow biopsy was done for further evaluation.  After consenting for the procedure, a small bone marrow core and aspirate were collected, which will be sent for flow cytometry, cytogenetics, and a myeloma FISH panel.  There were no complications experienced with this procedure.

## 2021-06-09 ENCOUNTER — Encounter: Payer: Medicare Other | Admitting: Cardiology

## 2021-06-12 LAB — SURGICAL PATHOLOGY

## 2021-06-13 NOTE — Progress Notes (Signed)
[facility]  [address] [place],  [place]  [address] [phone]  Clinic Day:  06/19/2021  Referring physician: [name], MD  This document serves as a record of services personally performed by Maren Wiesen Macarthur Critchley, MD. It was created on their behalf by [facility] E, a trained medical scribe. The creation of this record is based on the scribe's personal observations and the provider's statements to them.  HISTORY OF PRESENT ILLNESS:  The patient is a 87 y.o. female  who I recently began seeing for both leukocytosis and anemia.  Furthermore, labs showed a positive M spike and an elevated IgM level.  These findings led to a bone marrow examination being done. She comes in today to to go over her bone marrow biopsy results and their implications.  Since her last visit, the patient has been doing okay.  She continues to complain of increased fatigue and occasional dizziness.    PHYSICAL EXAM:  Blood pressure (!) 198/70, pulse 61, temperature 98.7 F (37.1 C), resp. rate 16, height _0  (1.6 m), weight 152 lb 11.2 oz (69.3 kg), SpO2 97 %. Wt Readings from Last 3 Encounters:  06/19/21 152 lb 11.2 oz (69.3 kg)  06/05/21 154 lb 4.8 oz (70 kg)  06/03/21 154 lb 3.2 oz (69.9 kg)   Body mass index is 27.05 kg/m. Performance status (ECOG): 1 - Symptomatic but completely ambulatory Physical Exam Constitutional:      Appearance: Normal appearance. She is not ill-appearing.  HENT:     Mouth/Throat:     Mouth: Mucous membranes are moist.     Pharynx: Oropharynx is clear. No oropharyngeal exudate or posterior oropharyngeal erythema.  Cardiovascular:     Rate and Rhythm: Normal rate and regular rhythm.     Heart sounds: No murmur heard.   No friction rub. No gallop.  Pulmonary:     Effort: Pulmonary effort is normal. No respiratory distress.     Breath sounds: Normal breath sounds. No wheezing, rhonchi or rales.  Abdominal:     General: Bowel  sounds are normal. There is no distension.     Palpations: Abdomen is soft. There is no mass.     Tenderness: There is no abdominal tenderness.  Musculoskeletal:        General: No swelling.     Right lower leg: No edema.     Left lower leg: No edema.  Lymphadenopathy:     Cervical: No cervical adenopathy.     Upper Body:     Right upper body: No supraclavicular or axillary adenopathy.     Left upper body: No supraclavicular or axillary adenopathy.     Lower Body: No right inguinal adenopathy. No left inguinal adenopathy.  Skin:    General: Skin is warm.     Coloration: Skin is not jaundiced.     Findings: No lesion or rash.  Neurological:     General: No focal deficit present.     Mental Status: She is alert and oriented to person, place, and time. Mental status is at baseline.  Psychiatric:        Mood and Affect: Mood normal.        Behavior: Behavior normal.        Thought Content: Thought content normal.   PATHOLOGY:  Her bone marrow biopsy revealed the following:  BONE MARROW, ASPIRATE, CLOT, CORE:  -Hypercellular bone marrow with features of myeloid neoplasm  -See comment   PERIPHERAL BLOOD:  -  Normocytic-normochromic anemia  -Leukocytosis   COMMENT:   The bone marrow shows dyspoietic changes involving myeloid cell lines  associated with ring sideroblasts.  No increase in blastic cells  identified.  The differential diagnosis includes myelodysplastic  syndrome with ring sideroblasts, or a myelodysplastic/myeloproliferative  neoplasm given the presence of leukocytosis in the peripheral blood.  Correlation with FISH and cytogenetic studies is strongly recommended.  The lymphoid component is limited with a few predominantly small  lymphoid aggregates displaying a mixture of T and B small lymphoid  cells.  By flow cytometry, there is an extremely minor B-cell population  present (less than 1%) with kappa light chain excess suggesting minimal  involvement by a B-cell  lymphoproliferative process.  A clonal plasma  cell component is not seen. Clinical correlation and follow-up are  recommended.   MICROSCOPIC DESCRIPTION:   PERIPHERAL BLOOD SMEAR: The red blood cells display moderate  anisocytosis with macrocytic and normocytic cells.  There is mild  poikilocytosis with elliptocytes, teardrop cells, target cells, and  scattered schistocytes.  There is mild polychromasia.  The white blood  cells are up increased in number, mostly with neutrophils many of which  display mild toxic granulation.  Occasional neutrophils are  hypogranular.  The platelets are normal in number.   BONE MARROW ASPIRATE: Bone marrow particles present  Erythroid precursors: Progressive maturation with many late precursors  displaying nuclear cytoplasmic dyssynchrony or occasional cells with  irregular nuclei  Granulocytic precursors: Progressive maturation with many maturing cells  displaying hypogranulation and/or hypolobation.  No increase in blastic  cells identified  Megakaryocytes: Abundant with predominantly abnormal large and /or  hyperchromatic cells.  Occasional small hypolobated forms are seen.  Lymphocytes/plasma cells: Large aggregates not present   TOUCH PREPARATIONS: A mixture of cell types present   CLOT AND BIOPSY: The core biopsy is suboptimal due to small size and  limited bone marrow particles. The clot sections show cellularity  estimated at 70% with a mixture of myeloid cell types including  increased number of megakaryocytes with clustering.  The megakaryocytes  display abnormal morphology with large and/or hyperchromatic forms.  A  few predominantly small, interstitial and well-circumscribed lymphoid  aggregates composed of small lymphoid cells are seen.  Large plasma cell  clusters are not present.  Immunohistochemical stains for CD138, CD20,  CD3, CD34, E-cadherin, myeloperoxidase, in addition to in situ  hybridization for kappa and lambda were  performed on block B1 with  appropriate controls.  CD34 only highlights scattering of positive  mononuclear cells.  E-cadherin highlights the erythroid component  composed of numerous predominantly small clusters.  Myeloperoxidase  highlights the predominant granulocytic component.  CD138 highlights a  relatively minor plasma cell component consisting of interstitial cells  and small clusters and displays polyclonal staining pattern for kappa  and lambda light chains.  The lymphoid aggregates show a mixture of T  and B cells.  In addition, reticulin stain was performed on block B1  with appropriate controls and shows mild to moderate increase in  reticulin fibers.   IRON STAIN: Iron stains are performed on a bone marrow aspirate or touch  imprint smear and section of clot. The controls stained appropriately.        Storage Iron: Present       Ring Sideroblasts: Present (more than 15% of erythroid precursors)   ADDITIONAL DATA/TESTING: The specimen was sent for cytogenetic analysis  and FISH for MDS and a separate report will follow.  Flow cytometric  analysis was performed (  WLS [id number]) and shows an extremely minor B-cell  population (less than 1% of all cells) with kappa light chain excess.  T  cells are predominant with relative abundance of CD8 positive cells.  A  significant CD34 positive blastic population is not identified.   Flow cytometry report DIAGNOSIS:   -Minor B-cell population with kappa light chain excess  -Predominance of T cells with relative abundance of CD8 positive cells  -See comment   COMMENT:   Flow cytometric analysis of the lymphoid population representing 8% of  all cells shows predominance of T lymphocytes expressing pan T-cell  antigens but with relative abundance of CD8 positive cells and reversal  of the CD4: CD8 ratio.  A small fraction of T cells also shows  expression for CD56.  The T-cell changes are not considered specific.  This is admixed  with an extremely minor population of B cells (less than  1% of all cells) displaying kappa light chain excess.  The B-cell  lymphoid changes are very limited but suggestive of minimal involvement  by a B-cell lymphoproliferative process which cannot be excluded.  A  significant CD34 positive blastic population is not identified.   MDS FISH panel:   Cytogenetics:  Myeloid Profile:    LABS:      ASSESSMENT & PLAN:  An 87 y.o. 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  Her bone marrow biopsy revealed that over 15% of her erythroid precursors are ring sideroblasts, which highlights the myelodysplastic features of her bone marrow disease.  Furthermore, additional studies showed that she harbors the JAK2 mutation, highlighting the myeloproliferative component of her bone marrow disease.  When evaluating her peripheral counts today, her white count, although elevated, is not particularly high.  Her leukocytosis will continue to be followed over time.  Her platelet count is normal.  Her hemoglobin is mildly low.  As mentioned previously, the major issue she complains about is increased fatigue, which likely relates to her anemia.  The myelodysplastic features from her bone marrow are the likely reasons behind her low-grade anemia.  However, her hemoglobin of 11.1 is too high for her to be considered for red cell shot therapy at this time.  I would consider it if her hemoglobin ever falls below 11.  For now, her CBC will be checked every 4 weeks.  If any noticeable changes in her peripheral counts occur, intervention would be considered, such as hydroxyurea for very elevated peripheral counts or red cell shot therapy for significant anemia.  I will see her back in 8 weeks for repeat clinical assessment.  The patient understands all the plans discussed today and is in agreement with them.  I, Rita Ohara, am acting  as scribe for Marice Potter, MD    I have reviewed this report as typed by the medical scribe, and it is complete and accurate.  Whitnie Deleon Macarthur Critchley, MD

## 2021-06-16 ENCOUNTER — Encounter (HOSPITAL_COMMUNITY): Payer: Self-pay | Admitting: Oncology

## 2021-06-18 ENCOUNTER — Encounter (HOSPITAL_COMMUNITY): Payer: Self-pay | Admitting: Oncology

## 2021-06-19 ENCOUNTER — Other Ambulatory Visit: Payer: Self-pay | Admitting: Oncology

## 2021-06-19 ENCOUNTER — Inpatient Hospital Stay: Payer: Medicare Other

## 2021-06-19 ENCOUNTER — Inpatient Hospital Stay: Payer: Medicare Other | Admitting: Oncology

## 2021-06-19 ENCOUNTER — Other Ambulatory Visit: Payer: Self-pay

## 2021-06-19 ENCOUNTER — Telehealth: Payer: Self-pay | Admitting: Oncology

## 2021-06-19 ENCOUNTER — Other Ambulatory Visit: Payer: Self-pay | Admitting: Hematology and Oncology

## 2021-06-19 DIAGNOSIS — D649 Anemia, unspecified: Secondary | ICD-10-CM

## 2021-06-19 DIAGNOSIS — D469 Myelodysplastic syndrome, unspecified: Secondary | ICD-10-CM

## 2021-06-19 LAB — CBC AND DIFFERENTIAL
HCT: 34 — AB (ref 36–46)
Hemoglobin: 11.1 — AB (ref 12.0–16.0)
Neutrophils Absolute: 13.04
Platelets: 267 (ref 150–399)
WBC: 16.1

## 2021-06-19 LAB — CBC
MCV: 99 (ref 81–99)
RBC: 3.42 — AB (ref 3.87–5.11)

## 2021-06-19 NOTE — Telephone Encounter (Signed)
Patient has been scheduled for follow-up visit per 04/09/22 los. Pt given an appt calendar with date and time.  

## 2021-06-20 ENCOUNTER — Telehealth: Payer: Self-pay

## 2021-06-20 NOTE — Telephone Encounter (Addendum)
06/23/20 - I notified pt of below. She verbalized understanding. I confirmed she had next appt date in her calendar as well.    06/20/20- Dr Bobby Rumpf asked me to call pt and let her know that she isn't eligible for the red shot injection as of know. Her Hgb is 11.1, and Hgb must be under 11 before we can administer the injection. He plans to check her labs monthly. I attempted call to pt, no answer.

## 2021-06-22 DIAGNOSIS — D469 Myelodysplastic syndrome, unspecified: Secondary | ICD-10-CM | POA: Insufficient documentation

## 2021-06-22 DIAGNOSIS — C946 Myelodysplastic disease, not classified: Secondary | ICD-10-CM | POA: Insufficient documentation

## 2021-06-22 HISTORY — DX: Myelodysplastic syndrome, unspecified: D46.9

## 2021-06-22 HISTORY — DX: Myelodysplastic disease, not elsewhere classified: C94.6

## 2021-06-23 LAB — SURGICAL PATHOLOGY

## 2021-07-16 ENCOUNTER — Other Ambulatory Visit: Payer: Self-pay | Admitting: Hematology and Oncology

## 2021-07-16 DIAGNOSIS — D469 Myelodysplastic syndrome, unspecified: Secondary | ICD-10-CM

## 2021-07-17 ENCOUNTER — Encounter: Payer: Self-pay | Admitting: Hematology and Oncology

## 2021-07-17 ENCOUNTER — Other Ambulatory Visit: Payer: Self-pay

## 2021-07-17 ENCOUNTER — Inpatient Hospital Stay: Payer: Medicare Other | Attending: Oncology

## 2021-07-17 DIAGNOSIS — D649 Anemia, unspecified: Secondary | ICD-10-CM | POA: Diagnosis not present

## 2021-07-17 DIAGNOSIS — D469 Myelodysplastic syndrome, unspecified: Secondary | ICD-10-CM

## 2021-07-17 LAB — CBC AND DIFFERENTIAL
HCT: 33 — AB (ref 36–46)
Hemoglobin: 11.1 — AB (ref 12.0–16.0)
Neutrophils Absolute: 12.07
Platelets: 252 (ref 150–399)
WBC: 14.9

## 2021-07-17 LAB — CBC: RBC: 3.3 — AB (ref 3.87–5.11)

## 2021-08-17 NOTE — Progress Notes (Signed)
[facility] ?[facility]  ?[address] ?[place],  [place]  [address] ?[phone] ? ?Clinic Day:  06/19/2021 ? ?Referring physician: [name], MD ? ?This document serves as a record of services personally performed by Marice Potter, MD. It was created on their behalf by Curry,Lauren E, a trained medical scribe. The creation of this record is based on the scribe's personal observations and the provider's statements to them. ? ?HISTORY OF PRESENT ILLNESS:  ?The patient is a 87 y.o. 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  Her bone marrow biopsy revealed that over 15% of her erythroid precursors are ring sideroblasts, which highlights the myelodysplastic features of her bone marrow.  Furthermore, additional studies showed that she harbors the JAK2 mutation; she also has a high white count.  These findings highlight the myeloproliferative component of her bone marrow. She also had a positive M spike and an elevated IgM level, but there were no bone marrow findings to suggest  lymphoplasmacytic lymphoma was present.  She comes in today for routine follow-up.  Once again, her major issue remains fatigue.  She has been anemic, but her hemoglobin has consistently been at/above 11.  She continues to deny having any overt forms of blood loss. ? ?PHYSICAL EXAM:  ?Blood pressure (!) 159/72, pulse 64, temperature 98.7 ?F (37.1 ?C), resp. rate 20, height 5' 3" (1.6 m), weight 152 lb 14.4 oz (69.4 kg), SpO2 94 %. ?Wt Readings from Last 3 Encounters:  ?08/18/21 152 lb 14.4 oz (69.4 kg)  ?06/19/21 152 lb 11.2 oz (69.3 kg)  ?06/05/21 154 lb 4.8 oz (70 kg)  ? ?Body mass index is 27.09 kg/m?Marland Kitchen ?Performance status (ECOG): 1 - Symptomatic but completely ambulatory ?Physical Exam ?Constitutional:   ?   Appearance: Normal appearance. She is not ill-appearing.  ?HENT:  ?   Mouth/Throat:  ?   Mouth: Mucous membranes are moist.  ?    Pharynx: Oropharynx is clear. No oropharyngeal exudate or posterior oropharyngeal erythema.  ?Cardiovascular:  ?   Rate and Rhythm: Normal rate and regular rhythm.  ?   Heart sounds: No murmur heard. ?  No friction rub. No gallop.  ?Pulmonary:  ?   Effort: Pulmonary effort is normal. No respiratory distress.  ?   Breath sounds: Normal breath sounds. No wheezing, rhonchi or rales.  ?Abdominal:  ?   General: Bowel sounds are normal. There is no distension.  ?   Palpations: Abdomen is soft. There is no mass.  ?   Tenderness: There is no abdominal tenderness.  ?Musculoskeletal:     ?   General: No swelling.  ?   Right lower leg: No edema.  ?   Left lower leg: No edema.  ?Lymphadenopathy:  ?   Cervical: No cervical adenopathy.  ?   Upper Body:  ?   Right upper body: No supraclavicular or axillary adenopathy.  ?   Left upper body: No supraclavicular or axillary adenopathy.  ?   Lower Body: No right inguinal adenopathy. No left inguinal adenopathy.  ?Skin: ?   General: Skin is warm.  ?   Coloration: Skin is not jaundiced.  ?   Findings: No lesion or rash.  ?Neurological:  ?   General: No focal deficit present.  ?   Mental Status: She is alert and oriented to person, place, and time. Mental status is at baseline.  ?Psychiatric:     ?   Mood and Affect:  Mood normal.     ?   Behavior: Behavior normal.     ?   Thought Content: Thought content normal.  ? ?LABS:  ? ? ?ASSESSMENT & PLAN:  ?An 86 y.o. female whose abnormal counts appear to be due to a myeloid neoplasm which has both myelodysplastic/myeloproliferative characteristics.  When evaluating her labs today, her white count has risen above 20, which likely reflects the myeloproliferative aspects of her bone marrow disease.  Based upon this, I will start her on hydroxyurea 500 mg every Monday, Wednesday, and Friday.  As it pertains to her anemia, her hemoglobin has now fallen below 11.  Based upon this, I will arrange for her to receive Retacrit 20,000 units every 3 weeks.   Her anemia is likely due to the myelodysplastic component of her bone marrow disease.  I will see this patient back in approximately 6 weeks to reassess her peripheral counts to see how much hydroxyurea and Retacrit have respectively improved her peripheral counts.  The patient understands all the plans discussed today and is in agreement with them. ? ? ?Dequincy A Lewis, MD ? ? ? ?  ? ?

## 2021-08-18 ENCOUNTER — Inpatient Hospital Stay: Payer: Medicare Other

## 2021-08-18 ENCOUNTER — Other Ambulatory Visit: Payer: Self-pay | Admitting: Student

## 2021-08-18 ENCOUNTER — Other Ambulatory Visit: Payer: Self-pay | Admitting: Oncology

## 2021-08-18 ENCOUNTER — Inpatient Hospital Stay: Payer: Medicare Other | Attending: Oncology | Admitting: Oncology

## 2021-08-18 ENCOUNTER — Other Ambulatory Visit: Payer: Self-pay

## 2021-08-18 VITALS — BP 159/72 | HR 64 | Temp 98.7°F | Resp 20 | Ht 63.0 in | Wt 152.9 lb

## 2021-08-18 DIAGNOSIS — D46Z Other myelodysplastic syndromes: Secondary | ICD-10-CM | POA: Insufficient documentation

## 2021-08-18 DIAGNOSIS — I480 Paroxysmal atrial fibrillation: Secondary | ICD-10-CM

## 2021-08-18 DIAGNOSIS — Z79899 Other long term (current) drug therapy: Secondary | ICD-10-CM | POA: Insufficient documentation

## 2021-08-18 DIAGNOSIS — D469 Myelodysplastic syndrome, unspecified: Secondary | ICD-10-CM

## 2021-08-18 LAB — CBC: RBC: 3.36 — AB (ref 3.87–5.11)

## 2021-08-18 LAB — CBC AND DIFFERENTIAL
HCT: 33 — AB (ref 36–46)
Hemoglobin: 10.5 — AB (ref 12.0–16.0)
Neutrophils Absolute: 17.22
Platelets: 276 10*3/uL (ref 150–400)
WBC: 20.5

## 2021-08-18 MED ORDER — HYDROXYUREA 500 MG PO CAPS: ORAL_CAPSULE | ORAL | 3 refills | Status: DC

## 2021-08-18 NOTE — Telephone Encounter (Signed)
Eliquis 40m refill request received. Patient is 87years old, weight-69.3kg, Crea-0.98 on 05/04/2021, Diagnosis-Afib & DVT, and last seen by Dr. CCurt Bears 05/07/2021. Eliquis was discontinued per hospitalization visit from 05/03/2021-05/05/2021 as it states: Acute symptomatic upper extremity DVT in a patient who is therapeutic on Eliquis that she takes for paroxysmal A. fib. thromboembolism while already on Eliquis. ?-Rule out malignancy, [facility] I have called and discussed with Dr LBobby Rumpfand discussed that underlying malignancy is less likely, however she will follow-up at cancer center to complete work-up. ?-Thromboembolism could have been triggered from pacemaker insertion?.  I discussed with her EP cardiology office.  [place]  [place] evaluate her for long-term anticoagulation. ?-Best treatment option for this patient is a Lovenox injection until we figure out final diagnosis.  We discussed in detail about going home with the Lovenox and following up with her hematologist oncologist. Injection techniques teaching to the patient and she is comfortable taking prefilled Lovenox syringe.  ?Discontinue Eliquis.  ?Lovenox 60 mg twice a day subcutaneous. ? ?Also, pt saw Dr. CCurt Bearson 05/07/2021 and he states: 2.  Paroxysmal atrial fibrillation: Presented to the hospital with rapid atrial fibrillation and pauses.  Was on Eliquis but has been switched to Lovenox with new left upper extremity DVT.  CHA2DS2-VASc of 4. ?  ?3.  Left upper extremity DVT: Potentially as a consequence of her pacemaker.  She is currently on Lovenox.  She got the DVT while on Eliquis.  She [place]  If the cancer center feels that she no longer leans Lovenox, she will need anticoagulation for atrial fibrillation. ? ?Therefore,  pt is to remain on [place]ovenox [place].  ?

## 2021-08-20 ENCOUNTER — Ambulatory Visit (INDEPENDENT_AMBULATORY_CARE_PROVIDER_SITE_OTHER): Payer: Medicare Other

## 2021-08-20 DIAGNOSIS — I495 Sick sinus syndrome: Secondary | ICD-10-CM | POA: Diagnosis not present

## 2021-08-20 LAB — CUP PACEART REMOTE DEVICE CHECK
Battery Remaining Longevity: 120 mo
Battery Remaining Percentage: 95.5 %
Battery Voltage: 3.02 V
Brady Statistic AP VP Percent: 1 %
Brady Statistic AP VS Percent: 21 %
Brady Statistic AS VP Percent: 1 %
Brady Statistic AS VS Percent: 78 %
Brady Statistic RA Percent Paced: 20 %
Brady Statistic RV Percent Paced: 1 %
Date Time Interrogation Session: 20230322020015
Implantable Lead Implant Date: 20220920
Implantable Lead Implant Date: 20220920
Implantable Lead Location: 753859
Implantable Lead Location: 753860
Implantable Pulse Generator Implant Date: 20220920
Lead Channel Impedance Value: 460 Ohm
Lead Channel Impedance Value: 480 Ohm
Lead Channel Pacing Threshold Amplitude: 0.5 V
Lead Channel Pacing Threshold Amplitude: 0.75 V
Lead Channel Pacing Threshold Pulse Width: 0.5 ms
Lead Channel Pacing Threshold Pulse Width: 0.5 ms
Lead Channel Sensing Intrinsic Amplitude: 12 mV
Lead Channel Sensing Intrinsic Amplitude: 2.4 mV
Lead Channel Setting Pacing Amplitude: 2 V
Lead Channel Setting Pacing Amplitude: 2.5 V
Lead Channel Setting Pacing Pulse Width: 0.5 ms
Lead Channel Setting Sensing Sensitivity: 2 mV
Pulse Gen Model: 2272
Pulse Gen Serial Number: 3963600

## 2021-08-21 ENCOUNTER — Inpatient Hospital Stay: Payer: Medicare Other

## 2021-08-21 ENCOUNTER — Other Ambulatory Visit: Payer: Self-pay

## 2021-08-21 VITALS — BP 156/67 | HR 65 | Temp 98.0°F | Resp 18 | Ht 63.0 in | Wt 152.2 lb

## 2021-08-21 DIAGNOSIS — D469 Myelodysplastic syndrome, unspecified: Secondary | ICD-10-CM

## 2021-08-21 DIAGNOSIS — Z79899 Other long term (current) drug therapy: Secondary | ICD-10-CM | POA: Diagnosis not present

## 2021-08-21 DIAGNOSIS — D46Z Other myelodysplastic syndromes: Secondary | ICD-10-CM | POA: Diagnosis not present

## 2021-08-21 MED ORDER — EPOETIN ALFA-EPBX 20000 UNIT/ML IJ SOLN
20000.0000 [IU] | Freq: Once | INTRAMUSCULAR | Status: AC
  Administered 2021-08-21: 20000 [IU] via SUBCUTANEOUS
  Filled 2021-08-21: qty 1

## 2021-08-21 NOTE — Patient Instructions (Signed)
Epoetin Alfa injection °What is this medication? °EPOETIN ALFA (e POE e tin AL fa) helps your body make more red blood cells. This medicine is used to treat anemia caused by chronic kidney disease, cancer chemotherapy, or HIV-therapy. It may also be used before surgery if you have anemia. °This medicine may be used for other purposes; ask your health care provider or pharmacist if you have questions. °COMMON BRAND NAME(S): Epogen, Procrit, Retacrit °What should I tell my care team before I take this medication? °They need to know if you have any of these conditions: °cancer °heart disease °high blood pressure °history of blood clots °history of stroke °low levels of folate, iron, or vitamin B12 in the blood °seizures °an unusual or allergic reaction to erythropoietin, albumin, benzyl alcohol, hamster proteins, other medicines, foods, dyes, or preservatives °pregnant or trying to get pregnant °breast-feeding °How should I use this medication? °This medicine is for injection into a vein or under the skin. It is usually given by a health care professional in a hospital or clinic setting. °If you get this medicine at home, you will be taught how to prepare and give this medicine. Use exactly as directed. Take your medicine at regular intervals. Do not take your medicine more often than directed. °It is important that you put your used needles and syringes in a special sharps container. Do not put them in a trash can. If you do not have a sharps container, call your pharmacist or healthcare provider to get one. °A special MedGuide will be given to you by the pharmacist with each prescription and refill. Be sure to read this information carefully each time. °Talk to your pediatrician regarding the use of this medicine in children. While this drug may be prescribed for selected conditions, precautions do apply. °Overdosage: If you think you have taken too much of this medicine contact a poison control center or emergency  room at once. °NOTE: This medicine is only for you. Do not share this medicine with others. °What if I miss a dose? °If you miss a dose, take it as soon as you can. If it is almost time for your next dose, take only that dose. Do not take double or extra doses. °What may interact with this medication? °Interactions have not been studied. °This list may not describe all possible interactions. Give your health care provider a list of all the medicines, herbs, non-prescription drugs, or dietary supplements you use. Also tell them if you smoke, drink alcohol, or use illegal drugs. Some items may interact with your medicine. °What should I watch for while using this medication? °Your condition will be monitored carefully while you are receiving this medicine. °You may need blood work done while you are taking this medicine. °This medicine may cause a decrease in vitamin B6. You should make sure that you get enough vitamin B6 while you are taking this medicine. Discuss the foods you eat and the vitamins you take with your health care professional. °What side effects may I notice from receiving this medication? °Side effects that you should report to your doctor or health care professional as soon as possible: °allergic reactions like skin rash, itching or hives, swelling of the face, lips, or tongue °seizures °signs and symptoms of a blood clot such as breathing problems; changes in vision; chest pain; severe, sudden headache; pain, swelling, warmth in the leg; trouble speaking; sudden numbness or weakness of the face, arm or leg °signs and symptoms of a stroke like   changes in vision; confusion; trouble speaking or understanding; severe headaches; sudden numbness or weakness of the face, arm or leg; trouble walking; dizziness; loss of balance or coordination °Side effects that usually do not require medical attention (report to your doctor or health care professional if they continue or are  bothersome): °chills °cough °dizziness °fever °headaches °joint pain °muscle cramps °muscle pain °nausea, vomiting °pain, redness, or irritation at site where injected °This list may not describe all possible side effects. Call your doctor for medical advice about side effects. You may report side effects to FDA at 1-800-FDA-1088. °Where should I keep my medication? °Keep out of the reach of children. °Store in a refrigerator between 2 and 8 degrees C (36 and 46 degrees F). Do not freeze or shake. Throw away any unused portion if using a single-dose vial. Multi-dose vials can be kept in the refrigerator for up to 21 days after the initial dose. Throw away unused medicine. °NOTE: This sheet is a summary. It may not cover all possible information. If you have questions about this medicine, talk to your doctor, pharmacist, or health care provider. °© 2022 Elsevier/Gold Standard (2017-01-19 00:00:00) ° °

## 2021-08-27 ENCOUNTER — Other Ambulatory Visit: Payer: Self-pay

## 2021-08-27 ENCOUNTER — Other Ambulatory Visit: Payer: Self-pay | Admitting: Student

## 2021-08-27 DIAGNOSIS — I82722 Chronic embolism and thrombosis of deep veins of left upper extremity: Secondary | ICD-10-CM

## 2021-08-27 DIAGNOSIS — I480 Paroxysmal atrial fibrillation: Secondary | ICD-10-CM

## 2021-08-27 MED ORDER — ELIQUIS 5 MG PO TABS: 5.0000 mg | ORAL_TABLET | Freq: Two times a day (BID) | ORAL | 2 refills | Status: DC

## 2021-09-02 NOTE — Progress Notes (Signed)
Remote pacemaker transmission.   

## 2021-09-10 ENCOUNTER — Inpatient Hospital Stay: Payer: Medicare Other | Attending: Oncology

## 2021-09-10 ENCOUNTER — Other Ambulatory Visit: Payer: Self-pay | Admitting: Pharmacist

## 2021-09-10 ENCOUNTER — Other Ambulatory Visit: Payer: Self-pay

## 2021-09-10 DIAGNOSIS — D469 Myelodysplastic syndrome, unspecified: Secondary | ICD-10-CM

## 2021-09-10 DIAGNOSIS — D46Z Other myelodysplastic syndromes: Secondary | ICD-10-CM | POA: Insufficient documentation

## 2021-09-10 LAB — CBC AND DIFFERENTIAL
HCT: 32 — AB (ref 36–46)
Hemoglobin: 10.1 — AB (ref 12.0–16.0)
Neutrophils Absolute: 12.37
Platelets: 215 10*3/uL (ref 150–400)
WBC: 14.9

## 2021-09-10 LAB — CBC: RBC: 3.2 — AB (ref 3.87–5.11)

## 2021-09-11 ENCOUNTER — Inpatient Hospital Stay: Payer: Medicare Other

## 2021-09-11 VITALS — BP 158/67 | HR 63 | Temp 98.8°F | Resp 18 | Wt 151.5 lb

## 2021-09-11 DIAGNOSIS — D46Z Other myelodysplastic syndromes: Secondary | ICD-10-CM | POA: Diagnosis not present

## 2021-09-11 DIAGNOSIS — D469 Myelodysplastic syndrome, unspecified: Secondary | ICD-10-CM

## 2021-09-11 MED ORDER — EPOETIN ALFA-EPBX 20000 UNIT/ML IJ SOLN
20000.0000 [IU] | Freq: Once | INTRAMUSCULAR | Status: AC
  Administered 2021-09-11: 20000 [IU] via SUBCUTANEOUS
  Filled 2021-09-11: qty 1

## 2021-09-11 NOTE — Patient Instructions (Signed)
Epoetin Alfa injection °What is this medication? °EPOETIN ALFA (e POE e tin AL fa) helps your body make more red blood cells. This medicine is used to treat anemia caused by chronic kidney disease, cancer chemotherapy, or HIV-therapy. It may also be used before surgery if you have anemia. °This medicine may be used for other purposes; ask your health care provider or pharmacist if you have questions. °COMMON BRAND NAME(S): Epogen, Procrit, Retacrit °What should I tell my care team before I take this medication? °They need to know if you have any of these conditions: °cancer °heart disease °high blood pressure °history of blood clots °history of stroke °low levels of folate, iron, or vitamin B12 in the blood °seizures °an unusual or allergic reaction to erythropoietin, albumin, benzyl alcohol, hamster proteins, other medicines, foods, dyes, or preservatives °pregnant or trying to get pregnant °breast-feeding °How should I use this medication? °This medicine is for injection into a vein or under the skin. It is usually given by a health care professional in a hospital or clinic setting. °If you get this medicine at home, you will be taught how to prepare and give this medicine. Use exactly as directed. Take your medicine at regular intervals. Do not take your medicine more often than directed. °It is important that you put your used needles and syringes in a special sharps container. Do not put them in a trash can. If you do not have a sharps container, call your pharmacist or healthcare provider to get one. °A special MedGuide will be given to you by the pharmacist with each prescription and refill. Be sure to read this information carefully each time. °Talk to your pediatrician regarding the use of this medicine in children. While this drug may be prescribed for selected conditions, precautions do apply. °Overdosage: If you think you have taken too much of this medicine contact a poison control center or emergency  room at once. °NOTE: This medicine is only for you. Do not share this medicine with others. °What if I miss a dose? °If you miss a dose, take it as soon as you can. If it is almost time for your next dose, take only that dose. Do not take double or extra doses. °What may interact with this medication? °Interactions have not been studied. °This list may not describe all possible interactions. Give your health care provider a list of all the medicines, herbs, non-prescription drugs, or dietary supplements you use. Also tell them if you smoke, drink alcohol, or use illegal drugs. Some items may interact with your medicine. °What should I watch for while using this medication? °Your condition will be monitored carefully while you are receiving this medicine. °You may need blood work done while you are taking this medicine. °This medicine may cause a decrease in vitamin B6. You should make sure that you get enough vitamin B6 while you are taking this medicine. Discuss the foods you eat and the vitamins you take with your health care professional. °What side effects may I notice from receiving this medication? °Side effects that you should report to your doctor or health care professional as soon as possible: °allergic reactions like skin rash, itching or hives, swelling of the face, lips, or tongue °seizures °signs and symptoms of a blood clot such as breathing problems; changes in vision; chest pain; severe, sudden headache; pain, swelling, warmth in the leg; trouble speaking; sudden numbness or weakness of the face, arm or leg °signs and symptoms of a stroke like   changes in vision; confusion; trouble speaking or understanding; severe headaches; sudden numbness or weakness of the face, arm or leg; trouble walking; dizziness; loss of balance or coordination °Side effects that usually do not require medical attention (report to your doctor or health care professional if they continue or are  bothersome): °chills °cough °dizziness °fever °headaches °joint pain °muscle cramps °muscle pain °nausea, vomiting °pain, redness, or irritation at site where injected °This list may not describe all possible side effects. Call your doctor for medical advice about side effects. You may report side effects to FDA at 1-800-FDA-1088. °Where should I keep my medication? °Keep out of the reach of children. °Store in a refrigerator between 2 and 8 degrees C (36 and 46 degrees F). Do not freeze or shake. Throw away any unused portion if using a single-dose vial. Multi-dose vials can be kept in the refrigerator for up to 21 days after the initial dose. Throw away unused medicine. °NOTE: This sheet is a summary. It may not cover all possible information. If you have questions about this medicine, talk to your doctor, pharmacist, or health care provider. °© 2022 Elsevier/Gold Standard (2017-01-19 00:00:00) ° °

## 2021-09-11 NOTE — Progress Notes (Signed)
1428:PT STABLE AT TIME OF DISCHARGE ?

## 2021-09-26 DIAGNOSIS — I480 Paroxysmal atrial fibrillation: Secondary | ICD-10-CM | POA: Diagnosis not present

## 2021-09-26 DIAGNOSIS — I10 Essential (primary) hypertension: Secondary | ICD-10-CM | POA: Diagnosis not present

## 2021-09-26 DIAGNOSIS — D469 Myelodysplastic syndrome, unspecified: Secondary | ICD-10-CM | POA: Diagnosis not present

## 2021-09-26 DIAGNOSIS — E785 Hyperlipidemia, unspecified: Secondary | ICD-10-CM | POA: Diagnosis not present

## 2021-10-01 NOTE — Progress Notes (Signed)
[facility] ?[facility]  ?[address] ?[place],  [place]  [address] ?[phone] ? ?Clinic Day:  10/02/2021 ? ?Referring physician: [name], MD ? ?HISTORY OF PRESENT ILLNESS:  ?The patient is a 87 y.o. 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  Her bone marrow biopsy revealed that over 15% of her erythroid precursors are ring sideroblasts, which highlights the myelodysplastic features of her bone marrow.  Furthermore, additional studies showed that she harbors the JAK2 mutation; she also has a high white count.  These findings highlight the myeloproliferative component of her bone marrow. She also had a positive M spike and an elevated IgM level, but there were no bone marrow findings to suggest  lymphoplasmacytic lymphoma was present.  She comes in today for routine follow-up.  Since her last visit, the patient has been doing fairly well.  She currently takes hydroxyurea 500 mg every Monday, Wednesday, and Friday as her white count recently rose above 20,000.  She also receives Retacrit injections every 3 weeks for her anemia, with the goal being to get her hemoglobin to/above 11.  She denies having increased fatigue or other findings which concern her for possible disease progression. ? ?PHYSICAL EXAM:  ?Blood pressure 134/61, pulse 67, temperature 98.9 ?F (37.2 ?C), resp. rate 18, height 5' 3"  (1.6 m), weight 152 lb 14.4 oz (69.4 kg), SpO2 95 %. ?Wt Readings from Last 3 Encounters:  ?10/02/21 152 lb 14.4 oz (69.4 kg)  ?09/11/21 151 lb 8 oz (68.7 kg)  ?08/21/21 152 lb 4 oz (69.1 kg)  ? ?Body mass index is 27.09 kg/m?Marland Kitchen ?Performance status (ECOG): 1 - Symptomatic but completely ambulatory ?Physical Exam ?Constitutional:   ?   Appearance: Normal appearance. She is not ill-appearing.  ?HENT:  ?   Mouth/Throat:  ?   Mouth: Mucous membranes are moist.  ?   Pharynx: Oropharynx is clear. No oropharyngeal exudate or  posterior oropharyngeal erythema.  ?Cardiovascular:  ?   Rate and Rhythm: Normal rate and regular rhythm.  ?   Heart sounds: No murmur heard. ?  No friction rub. No gallop.  ?Pulmonary:  ?   Effort: Pulmonary effort is normal. No respiratory distress.  ?   Breath sounds: Normal breath sounds. No wheezing, rhonchi or rales.  ?Abdominal:  ?   General: Bowel sounds are normal. There is no distension.  ?   Palpations: Abdomen is soft. There is no mass.  ?   Tenderness: There is no abdominal tenderness.  ?Musculoskeletal:     ?   General: No swelling.  ?   Right lower leg: No edema.  ?   Left lower leg: No edema.  ?Lymphadenopathy:  ?   Cervical: No cervical adenopathy.  ?   Upper Body:  ?   Right upper body: No supraclavicular or axillary adenopathy.  ?   Left upper body: No supraclavicular or axillary adenopathy.  ?   Lower Body: No right inguinal adenopathy. No left inguinal adenopathy.  ?Skin: ?   General: Skin is warm.  ?   Coloration: Skin is not jaundiced.  ?   Findings: No lesion or rash.  ?Neurological:  ?   General: No focal deficit present.  ?   Mental Status: She is alert and oriented to person, place, and time. Mental status is at baseline.  ?Psychiatric:     ?   Mood and Affect: Mood normal.     ?   Behavior: Behavior  normal.     ?   Thought Content: Thought content normal.  ? ?LABS:  ? ? ?ASSESSMENT & PLAN:  ?An 87 y.o. female whose abnormal counts appear to be due to a myeloid neoplasm which has both myelodysplastic/myeloproliferative characteristics.  When evaluating her labs today, I am pleased as her white count has fallen below 20.  Based upon this, she will continue to take hydroxyurea 500 mg every Monday, Wednesday, and Friday.  As it pertains to her anemia, although her hemoglobin is better, it remains below 11.  Based upon this, she will continue to receive Retacrit 20,000 units every 3 weeks.  Her CBC will continue to be followed every 6 weeks.  I will see her back in 12 weeks for repeat clinical  assessment.  The patient understands all the plans discussed today and is in agreement with them. ? ? ?Brodin Gelpi Macarthur Critchley, MD ? ? ? ?  ? ?

## 2021-10-02 ENCOUNTER — Other Ambulatory Visit: Payer: Self-pay | Admitting: Hematology and Oncology

## 2021-10-02 ENCOUNTER — Other Ambulatory Visit: Payer: Self-pay | Admitting: Oncology

## 2021-10-02 ENCOUNTER — Inpatient Hospital Stay: Payer: Medicare Other

## 2021-10-02 ENCOUNTER — Inpatient Hospital Stay: Payer: Medicare Other | Attending: Oncology | Admitting: Oncology

## 2021-10-02 ENCOUNTER — Other Ambulatory Visit: Payer: Self-pay

## 2021-10-02 VITALS — BP 134/61 | HR 67 | Temp 98.9°F | Resp 18 | Ht 63.0 in | Wt 152.9 lb

## 2021-10-02 DIAGNOSIS — D469 Myelodysplastic syndrome, unspecified: Secondary | ICD-10-CM

## 2021-10-02 DIAGNOSIS — D46Z Other myelodysplastic syndromes: Secondary | ICD-10-CM | POA: Insufficient documentation

## 2021-10-02 DIAGNOSIS — D649 Anemia, unspecified: Secondary | ICD-10-CM | POA: Diagnosis not present

## 2021-10-02 LAB — CBC AND DIFFERENTIAL
HCT: 34 — AB (ref 36–46)
Hemoglobin: 10.8 — AB (ref 12.0–16.0)
Neutrophils Absolute: 15.36
Platelets: 288 10*3/uL (ref 150–400)
WBC: 19.2

## 2021-10-02 LAB — CBC
MCV: 101 — AB (ref 81–99)
RBC: 3.42 — AB (ref 3.87–5.11)

## 2021-10-02 MED ORDER — EPOETIN ALFA-EPBX 20000 UNIT/ML IJ SOLN
20000.0000 [IU] | Freq: Once | INTRAMUSCULAR | Status: AC
  Administered 2021-10-02: 20000 [IU] via SUBCUTANEOUS
  Filled 2021-10-02: qty 1

## 2021-10-02 NOTE — Patient Instructions (Signed)
Epoetin Alfa injection ?What is this medication? ?EPOETIN ALFA (e POE e tin AL fa) helps your body make more red blood cells. This medicine is used to treat anemia caused by chronic kidney disease, cancer chemotherapy, or HIV-therapy. It may also be used before surgery if you have anemia. ?This medicine may be used for other purposes; ask your health care provider or pharmacist if you have questions. ?COMMON BRAND NAME(S): Epogen, Procrit, Retacrit ?What should I tell my care team before I take this medication? ?They need to know if you have any of these conditions: ?cancer ?heart disease ?high blood pressure ?history of blood clots ?history of stroke ?low levels of folate, iron, or vitamin B12 in the blood ?seizures ?an unusual or allergic reaction to erythropoietin, albumin, benzyl alcohol, hamster proteins, other medicines, foods, dyes, or preservatives ?pregnant or trying to get pregnant ?breast-feeding ?How should I use this medication? ?This medicine is for injection into a vein or under the skin. It is usually given by a health care professional in a hospital or clinic setting. ?If you get this medicine at home, you will be taught how to prepare and give this medicine. Use exactly as directed. Take your medicine at regular intervals. Do not take your medicine more often than directed. ?It is important that you put your used needles and syringes in a special sharps container. Do not put them in a trash can. If you do not have a sharps container, call your pharmacist or healthcare provider to get one. ?A special MedGuide will be given to you by the pharmacist with each prescription and refill. Be sure to read this information carefully each time. ?Talk to your pediatrician regarding the use of this medicine in children. While this drug may be prescribed for selected conditions, precautions do apply. ?Overdosage: If you think you have taken too much of this medicine contact a poison control center or emergency  room at once. ?NOTE: This medicine is only for you. Do not share this medicine with others. ?What if I miss a dose? ?If you miss a dose, take it as soon as you can. If it is almost time for your next dose, take only that dose. Do not take double or extra doses. ?What may interact with this medication? ?Interactions have not been studied. ?This list may not describe all possible interactions. Give your health care provider a list of all the medicines, herbs, non-prescription drugs, or dietary supplements you use. Also tell them if you smoke, drink alcohol, or use illegal drugs. Some items may interact with your medicine. ?What should I watch for while using this medication? ?Your condition will be monitored carefully while you are receiving this medicine. ?You may need blood work done while you are taking this medicine. ?This medicine may cause a decrease in vitamin B6. You should make sure that you get enough vitamin B6 while you are taking this medicine. Discuss the foods you eat and the vitamins you take with your health care professional. ?What side effects may I notice from receiving this medication? ?Side effects that you should report to your doctor or health care professional as soon as possible: ?allergic reactions like skin rash, itching or hives, swelling of the face, lips, or tongue ?seizures ?signs and symptoms of a blood clot such as breathing problems; changes in vision; chest pain; severe, sudden headache; pain, swelling, warmth in the leg; trouble speaking; sudden numbness or weakness of the face, arm or leg ?signs and symptoms of a stroke like   changes in vision; confusion; trouble speaking or understanding; severe headaches; sudden numbness or weakness of the face, arm or leg; trouble walking; dizziness; loss of balance or coordination ?Side effects that usually do not require medical attention (report to your doctor or health care professional if they continue or are  bothersome): ?chills ?cough ?dizziness ?fever ?headaches ?joint pain ?muscle cramps ?muscle pain ?nausea, vomiting ?pain, redness, or irritation at site where injected ?This list may not describe all possible side effects. Call your doctor for medical advice about side effects. You may report side effects to FDA at 1-800-FDA-1088. ?Where should I keep my medication? ?Keep out of the reach of children. ?Store in a refrigerator between 2 and 8 degrees C (36 and 46 degrees F). Do not freeze or shake. Throw away any unused portion if using a single-dose vial. Multi-dose vials can be kept in the refrigerator for up to 21 days after the initial dose. Throw away unused medicine. ?NOTE: This sheet is a summary. It may not cover all possible information. If you have questions about this medicine, talk to your doctor, pharmacist, or health care provider. ?? 2023 Elsevier/Gold Standard (2017-01-19 00:00:00) ? ?

## 2021-10-09 DIAGNOSIS — H353221 Exudative age-related macular degeneration, left eye, with active choroidal neovascularization: Secondary | ICD-10-CM | POA: Diagnosis not present

## 2021-10-09 DIAGNOSIS — H47393 Other disorders of optic disc, bilateral: Secondary | ICD-10-CM | POA: Diagnosis not present

## 2021-10-09 DIAGNOSIS — H353112 Nonexudative age-related macular degeneration, right eye, intermediate dry stage: Secondary | ICD-10-CM | POA: Diagnosis not present

## 2021-10-23 ENCOUNTER — Inpatient Hospital Stay: Payer: Medicare Other

## 2021-10-23 ENCOUNTER — Encounter: Payer: Self-pay | Admitting: Oncology

## 2021-10-23 VITALS — BP 132/54 | HR 66 | Temp 98.6°F | Resp 18 | Ht 63.0 in | Wt 156.5 lb

## 2021-10-23 DIAGNOSIS — D46Z Other myelodysplastic syndromes: Secondary | ICD-10-CM | POA: Diagnosis not present

## 2021-10-23 DIAGNOSIS — D469 Myelodysplastic syndrome, unspecified: Secondary | ICD-10-CM

## 2021-10-23 DIAGNOSIS — D649 Anemia, unspecified: Secondary | ICD-10-CM | POA: Diagnosis not present

## 2021-10-23 LAB — CBC: RBC: 3.23 — AB (ref 3.87–5.11)

## 2021-10-23 LAB — CBC AND DIFFERENTIAL
HCT: 33 — AB (ref 36–46)
Hemoglobin: 10.4 — AB (ref 12.0–16.0)
Neutrophils Absolute: 14.64
Platelets: 280 10*3/uL (ref 150–400)
WBC: 18.3

## 2021-10-23 MED ORDER — EPOETIN ALFA-EPBX 20000 UNIT/ML IJ SOLN
20000.0000 [IU] | Freq: Once | INTRAMUSCULAR | Status: AC
  Administered 2021-10-23: 20000 [IU] via SUBCUTANEOUS
  Filled 2021-10-23: qty 1

## 2021-10-23 NOTE — Patient Instructions (Signed)
Epoetin Alfa injection ?What is this medication? ?EPOETIN ALFA (e POE e tin AL fa) helps your body make more red blood cells. This medicine is used to treat anemia caused by chronic kidney disease, cancer chemotherapy, or HIV-therapy. It may also be used before surgery if you have anemia. ?This medicine may be used for other purposes; ask your health care provider or pharmacist if you have questions. ?COMMON BRAND NAME(S): Epogen, Procrit, Retacrit ?What should I tell my care team before I take this medication? ?They need to know if you have any of these conditions: ?cancer ?heart disease ?high blood pressure ?history of blood clots ?history of stroke ?low levels of folate, iron, or vitamin B12 in the blood ?seizures ?an unusual or allergic reaction to erythropoietin, albumin, benzyl alcohol, hamster proteins, other medicines, foods, dyes, or preservatives ?pregnant or trying to get pregnant ?breast-feeding ?How should I use this medication? ?This medicine is for injection into a vein or under the skin. It is usually given by a health care professional in a hospital or clinic setting. ?If you get this medicine at home, you will be taught how to prepare and give this medicine. Use exactly as directed. Take your medicine at regular intervals. Do not take your medicine more often than directed. ?It is important that you put your used needles and syringes in a special sharps container. Do not put them in a trash can. If you do not have a sharps container, call your pharmacist or healthcare provider to get one. ?A special MedGuide will be given to you by the pharmacist with each prescription and refill. Be sure to read this information carefully each time. ?Talk to your pediatrician regarding the use of this medicine in children. While this drug may be prescribed for selected conditions, precautions do apply. ?Overdosage: If you think you have taken too much of this medicine contact a poison control center or emergency  room at once. ?NOTE: This medicine is only for you. Do not share this medicine with others. ?What if I miss a dose? ?If you miss a dose, take it as soon as you can. If it is almost time for your next dose, take only that dose. Do not take double or extra doses. ?What may interact with this medication? ?Interactions have not been studied. ?This list may not describe all possible interactions. Give your health care provider a list of all the medicines, herbs, non-prescription drugs, or dietary supplements you use. Also tell them if you smoke, drink alcohol, or use illegal drugs. Some items may interact with your medicine. ?What should I watch for while using this medication? ?Your condition will be monitored carefully while you are receiving this medicine. ?You may need blood work done while you are taking this medicine. ?This medicine may cause a decrease in vitamin B6. You should make sure that you get enough vitamin B6 while you are taking this medicine. Discuss the foods you eat and the vitamins you take with your health care professional. ?What side effects may I notice from receiving this medication? ?Side effects that you should report to your doctor or health care professional as soon as possible: ?allergic reactions like skin rash, itching or hives, swelling of the face, lips, or tongue ?seizures ?signs and symptoms of a blood clot such as breathing problems; changes in vision; chest pain; severe, sudden headache; pain, swelling, warmth in the leg; trouble speaking; sudden numbness or weakness of the face, arm or leg ?signs and symptoms of a stroke like   changes in vision; confusion; trouble speaking or understanding; severe headaches; sudden numbness or weakness of the face, arm or leg; trouble walking; dizziness; loss of balance or coordination ?Side effects that usually do not require medical attention (report to your doctor or health care professional if they continue or are  bothersome): ?chills ?cough ?dizziness ?fever ?headaches ?joint pain ?muscle cramps ?muscle pain ?nausea, vomiting ?pain, redness, or irritation at site where injected ?This list may not describe all possible side effects. Call your doctor for medical advice about side effects. You may report side effects to FDA at 1-800-FDA-1088. ?Where should I keep my medication? ?Keep out of the reach of children. ?Store in a refrigerator between 2 and 8 degrees C (36 and 46 degrees F). Do not freeze or shake. Throw away any unused portion if using a single-dose vial. Multi-dose vials can be kept in the refrigerator for up to 21 days after the initial dose. Throw away unused medicine. ?NOTE: This sheet is a summary. It may not cover all possible information. If you have questions about this medicine, talk to your doctor, pharmacist, or health care provider. ?? 2023 Elsevier/Gold Standard (2017-01-19 00:00:00) ? ?

## 2021-11-06 ENCOUNTER — Telehealth: Payer: Self-pay | Admitting: Internal Medicine

## 2021-11-06 DIAGNOSIS — I82722 Chronic embolism and thrombosis of deep veins of left upper extremity: Secondary | ICD-10-CM

## 2021-11-06 DIAGNOSIS — I480 Paroxysmal atrial fibrillation: Secondary | ICD-10-CM

## 2021-11-06 NOTE — Telephone Encounter (Signed)
*  STAT* If patient is at the pharmacy, call can be transferred to refill team.   1. Which medications need to be refilled? (please list name of each medication and dose if known)   ELIQUIS 5 MG TABS tablet    2. Which pharmacy/location (including street and city if local pharmacy) is medication to be sent to?  [id number] , [place] - [id number] 3. Do they need a 30 day or 90 day supply?  60 day

## 2021-11-07 MED ORDER — ELIQUIS 5 MG PO TABS: 5.0000 mg | ORAL_TABLET | Freq: Two times a day (BID) | ORAL | 5 refills | Status: DC

## 2021-11-07 NOTE — Telephone Encounter (Signed)
Prescription refill request for Eliquis received. Indication:Afib  Last office visit: 05/07/21 ([place])  Scr: 0.98 (05/04/21) Age: 87 Weight: 71kg  Appropriate dose and refill sent to requested pharmacy.

## 2021-11-13 ENCOUNTER — Inpatient Hospital Stay: Payer: Medicare Other

## 2021-11-13 ENCOUNTER — Other Ambulatory Visit: Payer: Self-pay

## 2021-11-13 ENCOUNTER — Inpatient Hospital Stay: Payer: Medicare Other | Attending: Oncology

## 2021-11-13 VITALS — BP 150/70 | HR 66 | Temp 98.1°F | Resp 18 | Ht 63.0 in | Wt 150.1 lb

## 2021-11-13 DIAGNOSIS — D46Z Other myelodysplastic syndromes: Secondary | ICD-10-CM | POA: Diagnosis not present

## 2021-11-13 DIAGNOSIS — D649 Anemia, unspecified: Secondary | ICD-10-CM | POA: Diagnosis not present

## 2021-11-13 DIAGNOSIS — D469 Myelodysplastic syndrome, unspecified: Secondary | ICD-10-CM

## 2021-11-13 LAB — CBC: RBC: 3.12 — AB (ref 3.87–5.11)

## 2021-11-13 LAB — CBC AND DIFFERENTIAL
HCT: 32 — AB (ref 36–46)
Hemoglobin: 9.9 — AB (ref 12.0–16.0)
Neutrophils Absolute: 14.84
Platelets: 271 10*3/uL (ref 150–400)
WBC: 18.1

## 2021-11-13 MED ORDER — EPOETIN ALFA-EPBX 20000 UNIT/ML IJ SOLN
20000.0000 [IU] | Freq: Once | INTRAMUSCULAR | Status: AC
  Administered 2021-11-13: 20000 [IU] via SUBCUTANEOUS
  Filled 2021-11-13: qty 1

## 2021-11-13 NOTE — Patient Instructions (Signed)
Epoetin Alfa injection ?What is this medication? ?EPOETIN ALFA (e POE e tin AL fa) helps your body make more red blood cells. This medicine is used to treat anemia caused by chronic kidney disease, cancer chemotherapy, or HIV-therapy. It may also be used before surgery if you have anemia. ?This medicine may be used for other purposes; ask your health care provider or pharmacist if you have questions. ?COMMON BRAND NAME(S): Epogen, Procrit, Retacrit ?What should I tell my care team before I take this medication? ?They need to know if you have any of these conditions: ?cancer ?heart disease ?high blood pressure ?history of blood clots ?history of stroke ?low levels of folate, iron, or vitamin B12 in the blood ?seizures ?an unusual or allergic reaction to erythropoietin, albumin, benzyl alcohol, hamster proteins, other medicines, foods, dyes, or preservatives ?pregnant or trying to get pregnant ?breast-feeding ?How should I use this medication? ?This medicine is for injection into a vein or under the skin. It is usually given by a health care professional in a hospital or clinic setting. ?If you get this medicine at home, you will be taught how to prepare and give this medicine. Use exactly as directed. Take your medicine at regular intervals. Do not take your medicine more often than directed. ?It is important that you put your used needles and syringes in a special sharps container. Do not put them in a trash can. If you do not have a sharps container, call your pharmacist or healthcare provider to get one. ?A special MedGuide will be given to you by the pharmacist with each prescription and refill. Be sure to read this information carefully each time. ?Talk to your pediatrician regarding the use of this medicine in children. While this drug may be prescribed for selected conditions, precautions do apply. ?Overdosage: If you think you have taken too much of this medicine contact a poison control center or emergency  room at once. ?NOTE: This medicine is only for you. Do not share this medicine with others. ?What if I miss a dose? ?If you miss a dose, take it as soon as you can. If it is almost time for your next dose, take only that dose. Do not take double or extra doses. ?What may interact with this medication? ?Interactions have not been studied. ?This list may not describe all possible interactions. Give your health care provider a list of all the medicines, herbs, non-prescription drugs, or dietary supplements you use. Also tell them if you smoke, drink alcohol, or use illegal drugs. Some items may interact with your medicine. ?What should I watch for while using this medication? ?Your condition will be monitored carefully while you are receiving this medicine. ?You may need blood work done while you are taking this medicine. ?This medicine may cause a decrease in vitamin B6. You should make sure that you get enough vitamin B6 while you are taking this medicine. Discuss the foods you eat and the vitamins you take with your health care professional. ?What side effects may I notice from receiving this medication? ?Side effects that you should report to your doctor or health care professional as soon as possible: ?allergic reactions like skin rash, itching or hives, swelling of the face, lips, or tongue ?seizures ?signs and symptoms of a blood clot such as breathing problems; changes in vision; chest pain; severe, sudden headache; pain, swelling, warmth in the leg; trouble speaking; sudden numbness or weakness of the face, arm or leg ?signs and symptoms of a stroke like   changes in vision; confusion; trouble speaking or understanding; severe headaches; sudden numbness or weakness of the face, arm or leg; trouble walking; dizziness; loss of balance or coordination ?Side effects that usually do not require medical attention (report to your doctor or health care professional if they continue or are  bothersome): ?chills ?cough ?dizziness ?fever ?headaches ?joint pain ?muscle cramps ?muscle pain ?nausea, vomiting ?pain, redness, or irritation at site where injected ?This list may not describe all possible side effects. Call your doctor for medical advice about side effects. You may report side effects to FDA at 1-800-FDA-1088. ?Where should I keep my medication? ?Keep out of the reach of children. ?Store in a refrigerator between 2 and 8 degrees C (36 and 46 degrees F). Do not freeze or shake. Throw away any unused portion if using a single-dose vial. Multi-dose vials can be kept in the refrigerator for up to 21 days after the initial dose. Throw away unused medicine. ?NOTE: This sheet is a summary. It may not cover all possible information. If you have questions about this medicine, talk to your doctor, pharmacist, or health care provider. ?? 2023 Elsevier/Gold Standard (2017-01-19 00:00:00) ? ?

## 2021-11-19 ENCOUNTER — Ambulatory Visit (INDEPENDENT_AMBULATORY_CARE_PROVIDER_SITE_OTHER): Payer: Medicare Other

## 2021-11-19 DIAGNOSIS — I495 Sick sinus syndrome: Secondary | ICD-10-CM | POA: Diagnosis not present

## 2021-11-21 LAB — CUP PACEART REMOTE DEVICE CHECK
Battery Remaining Longevity: 116 mo
Battery Remaining Percentage: 95.5 %
Battery Voltage: 3.02 V
Brady Statistic AP VP Percent: 1 %
Brady Statistic AP VS Percent: 24 %
Brady Statistic AS VP Percent: 1 %
Brady Statistic AS VS Percent: 75 %
Brady Statistic RA Percent Paced: 23 %
Brady Statistic RV Percent Paced: 1 %
Date Time Interrogation Session: 20230621020016
Implantable Lead Implant Date: 20220920
Implantable Lead Implant Date: 20220920
Implantable Lead Location: 753859
Implantable Lead Location: 753860
Implantable Pulse Generator Implant Date: 20220920
Lead Channel Impedance Value: 400 Ohm
Lead Channel Impedance Value: 430 Ohm
Lead Channel Pacing Threshold Amplitude: 0.5 V
Lead Channel Pacing Threshold Amplitude: 0.75 V
Lead Channel Pacing Threshold Pulse Width: 0.5 ms
Lead Channel Pacing Threshold Pulse Width: 0.5 ms
Lead Channel Sensing Intrinsic Amplitude: 10.8 mV
Lead Channel Sensing Intrinsic Amplitude: 2.3 mV
Lead Channel Setting Pacing Amplitude: 2 V
Lead Channel Setting Pacing Amplitude: 2.5 V
Lead Channel Setting Pacing Pulse Width: 0.5 ms
Lead Channel Setting Sensing Sensitivity: 2 mV
Pulse Gen Model: 2272
Pulse Gen Serial Number: 3963600

## 2021-11-27 DIAGNOSIS — E559 Vitamin D deficiency, unspecified: Secondary | ICD-10-CM | POA: Insufficient documentation

## 2021-12-01 ENCOUNTER — Encounter: Payer: Self-pay | Admitting: Cardiology

## 2021-12-01 ENCOUNTER — Ambulatory Visit: Payer: Medicare Other | Admitting: Cardiology

## 2021-12-01 VITALS — BP 130/68 | HR 64 | Ht 63.0 in | Wt 149.8 lb

## 2021-12-01 DIAGNOSIS — E782 Mixed hyperlipidemia: Secondary | ICD-10-CM

## 2021-12-01 DIAGNOSIS — R072 Precordial pain: Secondary | ICD-10-CM | POA: Diagnosis not present

## 2021-12-01 DIAGNOSIS — I10 Essential (primary) hypertension: Secondary | ICD-10-CM

## 2021-12-01 DIAGNOSIS — R0789 Other chest pain: Secondary | ICD-10-CM | POA: Diagnosis not present

## 2021-12-01 DIAGNOSIS — I480 Paroxysmal atrial fibrillation: Secondary | ICD-10-CM

## 2021-12-01 NOTE — Progress Notes (Signed)
kg

## 2021-12-01 NOTE — Progress Notes (Signed)
Cardiology Office Note:    Date:  12/01/2021   ID:  Alexandra Kim, DOB [birth year], MRN [id number]  PCP:  Alexandra Dress, MD  Cardiologist:  Alexandra Lindau, MD   Referring MD: Alexandra Dress, MD    ASSESSMENT:    1. Chest pressure   2. AF (paroxysmal atrial fibrillation) ([place])   3. Essential hypertension   4. Mixed hyperlipidemia    PLAN:    In order of problems listed above:  Primary prevention stressed with patient.  Importance of compliance with diet medication stressed and she vocalized understanding. Paroxysmal atrial fibrillation:I discussed with the patient atrial fibrillation, disease process. Management and therapy including rate and rhythm control, anticoagulation benefits and potential risks were discussed extensively with the patient. Patient had multiple questions which were answered to patient's satisfaction. Dyspnea on exertion: I am concerned about her coronary status and whether this would be an indicator of coronary artery disease.  I have discussed CT coronary angiography with FFR.  She is agreeable.  We will set her up for this. Cardiac murmur: Echocardiogram will be done to assess murmur heard on auscultation. Essential hypertension: Blood pressure stable and diet was emphasized. She will be seen in follow-up appointment in 4 to 6 weeks or earlier if she has any concerns.  Patient had multiple questions which were answered to her satisfaction.   Medication Adjustments/Labs and Tests Ordered: Current medicines are reviewed at length with the patient today.  Concerns regarding medicines are outlined above.  Orders Placed This Encounter  Procedures   EKG 12-Lead   No orders of the defined types were placed in this encounter.    Chief Complaint  Patient presents with   Shortness of Breath   Chest Pain     History of Present Illness:    Alexandra Kim is a 87 y.o. female.  Patient has past medical history of paroxysmal atrial fibrillation on  anticoagulation, myelodysplastic disease, and permanent pacemaker.  Patient mentions to me that she is having shortness of breath with exertion and this is getting worse and she is concerned about it.  No chest pain.  No orthopnea or PND.  At the time of my evaluation, the patient is alert awake oriented and in no distress.  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   ELIQUIS 5 MG TABS tablet Take 1 tablet (5 mg total) by mouth 2 (two) times daily.   hydroxyurea (  HYDREA) 500 MG capsule One tab PO every Monday, Wednesday, Friday (Patient taking differently: Take 500 mg by mouth See admin instructions. One tab PO every Monday, Wednesday, Friday)   metoprolol succinate (TOPROL-XL) 50 MG 24 hr tablet Take 1.5 tablets (75 mg total) by mouth at bedtime.   multivitamin-lutein (OCUVITE-LUTEIN) CAPS  capsule Take 1 capsule by mouth in the morning and at bedtime.   pantoprazole (PROTONIX) 40 MG tablet Take 40 mg by mouth daily as needed (Acid reflux).   PFIZER COVID-19 VAC BIVALENT injection Inject 0.3 mLs into the muscle once.   valsartan-hydrochlorothiazide (DIOVAN-HCT) 320-25 MG tablet Take 1 tablet by mouth daily.     Allergies:   Ace inhibitors   Social History   Socioeconomic History   Marital status: Widowed    Spouse name: Not on file   Number of children: Not on file   Years of education: Not on file   Highest education level: Not on file  Occupational History   Not on file  Tobacco Use   Smoking status: Former    Types: Cigarettes    Quit date: 01/13/2007    Years since quitting: 14.8   Smokeless tobacco: Never  Vaping Use   Vaping Use: Never used  Substance and Sexual Activity   Alcohol use: No   Drug use: Never   Sexual activity: Not on file  Other Topics Concern   Not on file  Social History Narrative   Not on file   Social Determinants of Health   Financial Resource Strain: Not on file  Food Insecurity: Not on file  Transportation Needs: Not on file  Physical Activity: Not on file  Stress: Not on file  Social Connections: Not on file     Family History: The patient's family history includes Breast cancer in her sister; Diabetes in her mother; Heart disease in her mother; Hypertension in her mother; Lung cancer in her father.  ROS:   Please see the history of present illness.    All other systems reviewed and are negative.  EKGs/Labs/Other Studies Reviewed:    The following studies were reviewed today: EKG reveals paced rhythm   Recent Labs: 02/18/2021: TSH 3.279 05/04/2021: ALT 11; BUN 21; Creatinine, Ser 0.98; Magnesium 1.8; Potassium 3.7; Sodium 135 05/07/2021: NT-Pro BNP 541 11/13/2021: Hemoglobin 9.9; Platelets 271  Recent Lipid Panel    Component Value Date/Time   CHOL 97 02/19/2021 0212   TRIG 82 02/19/2021 0212   HDL 29 (L)  02/19/2021 0212   CHOLHDL 3.3 02/19/2021 0212   VLDL 16 02/19/2021 0212   LDLCALC 52 02/19/2021 0212    Physical Exam:    VS:  BP 130/68 (BP Location: Left Arm, Patient Position: Sitting)   Pulse 64   Ht '5\' 3"'$  (1.6 m)   Wt 149 lb 12.8 oz (67.9 kg)   SpO2 95%   BMI 26.54 kg/m     Wt Readings from Last 3 Encounters:  12/01/21 149 lb 12.8 oz (67.9 kg)  11/13/21 150 lb 1.9 oz (68.1 kg)  10/23/21 156 lb 8 oz (71 kg)     GEN: Patient is in no acute distress HEENT: Normal NECK: No JVD; No carotid bruits LYMPHATICS: No lymphadenopathy CARDIAC: Hear sounds regular, 2/6 systolic murmur at the apex. RESPIRATORY:  Clear to auscultation without rales, wheezing or rhonchi  ABDOMEN: Soft, non-tender, non-distended MUSCULOSKELETAL:  No edema; No deformity  SKIN: Warm and dry NEUROLOGIC:  Alert and oriented x 3 PSYCHIATRIC:  Normal affect   Signed,  Alexandra Lindau, MD  12/01/2021 2:54 PM    [facility]

## 2021-12-01 NOTE — Progress Notes (Signed)
Remote pacemaker transmission.   

## 2021-12-01 NOTE — Patient Instructions (Signed)
Medication Instructions:  Your physician recommends that you continue on your current medications as directed. Please refer to the Current Medication list given to you today.  *If you need a refill on your cardiac medications before your next appointment, please call your pharmacy*   Lab Work: Your physician recommends that you return for lab work in: Today for BMP  If you have labs (blood work) drawn today and your tests are completely normal, you will receive your results only by: [place] (if you have MyChart) OR A paper copy in the mail If you have any lab test that is abnormal or we need to change your treatment, we will call you to review the results.   Testing/Procedures: Your physician has requested that you have an echocardiogram. Echocardiography is a painless test that uses sound waves to create images of your heart. It provides your doctor with information about the size and shape of your heart and how well your heart's chambers and valves are working. This procedure takes approximately one hour. There are no restrictions for this procedure.    Your cardiac CT will be scheduled at one of the below locations:   [facility] [address] [place], [place] [address] [phone]   If scheduled at [facility], please arrive at the [facility] and Children's Entrance (Entrance C2) of [facility] 30 minutes prior to test start time. You can use the FREE valet parking offered at entrance C (encouraged to control the heart rate for the test)  Proceed to the [facility] Radiology Department (first floor) to check-in and test prep.  All radiology patients and guests should use entrance C2 at [facility], accessed from [facility], even though the hospital's physical address listed is [address]    Please follow these instructions carefully (unless otherwise directed):   On the Night Before the Test: Be sure to  Drink plenty of water. Do not consume any caffeinated/decaffeinated beverages or chocolate 12 hours prior to your test. Do not take any antihistamines 12 hours prior to your test. On the Day of the Test: Drink plenty of water until 1 hour prior to the test. Do not eat any food 4 hours prior to the test. You may take your regular medications prior to the test.  Take metoprolol (Lopressor) two hours prior to test. HOLD Furosemide/Hydrochlorothiazide morning of the test. FEMALES- please wear underwire-free bra if available, avoid dresses & tight clothing   After the Test: Drink plenty of water. After receiving IV contrast, you may experience a mild flushed feeling. This is normal. On occasion, you may experience a mild rash up to 24 hours after the test. This is not dangerous. If this occurs, you can take Benadryl 25 mg and increase your fluid intake. If you experience trouble breathing, this can be serious. If it is severe call 911 IMMEDIATELY. If it is mild, please call our office. We will call to schedule your test 2-4 weeks out understanding that some insurance companies will need an authorization prior to the service being performed.   For non-scheduling related questions, please contact the cardiac imaging nurse navigator should you have any questions/concerns: Marchia Bond, Cardiac Imaging Nurse Navigator Gordy Clement, Cardiac Imaging Nurse Navigator [facility] Direct Office Dial: [phone]   For scheduling needs, including cancellations and rescheduling, please call [place], [phone].    Follow-Up: At [facility], you and your health needs are our priority.  As  part of our continuing mission to provide you with exceptional heart care, we have created designated Provider Care Teams.  These Care Teams include your primary Cardiologist (physician) and Advanced Practice Providers (APPs -  Physician Assistants and Nurse Practitioners) who all  work together to provide you with the care you need, when you need it.  We recommend signing up for the patient portal called "MyChart".  Sign up information is provided on this After Visit Summary.  MyChart is used to connect with patients for Virtual Visits (Telemedicine).  Patients are able to view lab/test results, encounter notes, upcoming appointments, etc.  Non-urgent messages can be sent to your provider as well.   To learn more about what you can do with MyChart, go to [email or web address].    Your next appointment:   6 week(s)  The format for your next appointment:   In Person  Provider:   Jyl Heinz, MD    Other Instructions   Important Information About Sugar

## 2021-12-02 LAB — BASIC METABOLIC PANEL
BUN/Creatinine Ratio: 24 (ref 12–28)
BUN: 22 mg/dL (ref 8–27)
CO2: 26 mmol/L (ref 20–29)
Calcium: 9.4 mg/dL (ref 8.7–10.3)
Chloride: 98 mmol/L (ref 96–106)
Creatinine, Ser: 0.9 mg/dL (ref 0.57–1.00)
Glucose: 93 mg/dL (ref 70–99)
Potassium: 4.4 mmol/L (ref 3.5–5.2)
Sodium: 138 mmol/L (ref 134–144)
eGFR: 63 mL/min/{1.73_m2} (ref >59)

## 2021-12-04 ENCOUNTER — Inpatient Hospital Stay: Payer: Medicare Other | Attending: Oncology

## 2021-12-04 ENCOUNTER — Inpatient Hospital Stay: Payer: Medicare Other

## 2021-12-04 ENCOUNTER — Other Ambulatory Visit: Payer: Self-pay | Admitting: Pharmacist

## 2021-12-04 VITALS — BP 121/58 | HR 66 | Temp 97.4°F | Resp 18 | Wt 150.0 lb

## 2021-12-04 DIAGNOSIS — D469 Myelodysplastic syndrome, unspecified: Secondary | ICD-10-CM | POA: Diagnosis not present

## 2021-12-04 DIAGNOSIS — D46Z Other myelodysplastic syndromes: Secondary | ICD-10-CM | POA: Insufficient documentation

## 2021-12-04 LAB — IRON AND TIBC
Iron: 91 ug/dL (ref 28–170)
Saturation Ratios: 33 % — ABNORMAL HIGH (ref 10.4–31.8)
TIBC: 278 ug/dL (ref 250–450)
UIBC: 187 ug/dL

## 2021-12-04 LAB — CBC AND DIFFERENTIAL
HCT: 31 — AB (ref 36–46)
Hemoglobin: 10 — AB (ref 12.0–16.0)
Neutrophils Absolute: 13.69
Platelets: 261 10*3/uL (ref 150–400)
WBC: 16.7

## 2021-12-04 LAB — FERRITIN: Ferritin: 141 ng/mL (ref 11–307)

## 2021-12-04 LAB — VITAMIN B12: Vitamin B-12: 799 pg/mL (ref 180–914)

## 2021-12-04 LAB — CBC: RBC: 3.07 — AB (ref 3.87–5.11)

## 2021-12-04 LAB — FOLATE: Folate: 14.6 ng/mL (ref >5.9)

## 2021-12-04 MED ORDER — EPOETIN ALFA-EPBX 20000 UNIT/ML IJ SOLN
20000.0000 [IU] | Freq: Once | INTRAMUSCULAR | Status: AC
  Administered 2021-12-04: 20000 [IU] via SUBCUTANEOUS
  Filled 2021-12-04: qty 1

## 2021-12-04 NOTE — Patient Instructions (Signed)
Epoetin Alfa injection ?What is this medication? ?EPOETIN ALFA (e POE e tin AL fa) helps your body make more red blood cells. This medicine is used to treat anemia caused by chronic kidney disease, cancer chemotherapy, or HIV-therapy. It may also be used before surgery if you have anemia. ?This medicine may be used for other purposes; ask your health care provider or pharmacist if you have questions. ?COMMON BRAND NAME(S): Epogen, Procrit, Retacrit ?What should I tell my care team before I take this medication? ?They need to know if you have any of these conditions: ?cancer ?heart disease ?high blood pressure ?history of blood clots ?history of stroke ?low levels of folate, iron, or vitamin B12 in the blood ?seizures ?an unusual or allergic reaction to erythropoietin, albumin, benzyl alcohol, hamster proteins, other medicines, foods, dyes, or preservatives ?pregnant or trying to get pregnant ?breast-feeding ?How should I use this medication? ?This medicine is for injection into a vein or under the skin. It is usually given by a health care professional in a hospital or clinic setting. ?If you get this medicine at home, you will be taught how to prepare and give this medicine. Use exactly as directed. Take your medicine at regular intervals. Do not take your medicine more often than directed. ?It is important that you put your used needles and syringes in a special sharps container. Do not put them in a trash can. If you do not have a sharps container, call your pharmacist or healthcare provider to get one. ?A special MedGuide will be given to you by the pharmacist with each prescription and refill. Be sure to read this information carefully each time. ?Talk to your pediatrician regarding the use of this medicine in children. While this drug may be prescribed for selected conditions, precautions do apply. ?Overdosage: If you think you have taken too much of this medicine contact a poison control center or emergency  room at once. ?NOTE: This medicine is only for you. Do not share this medicine with others. ?What if I miss a dose? ?If you miss a dose, take it as soon as you can. If it is almost time for your next dose, take only that dose. Do not take double or extra doses. ?What may interact with this medication? ?Interactions have not been studied. ?This list may not describe all possible interactions. Give your health care provider a list of all the medicines, herbs, non-prescription drugs, or dietary supplements you use. Also tell them if you smoke, drink alcohol, or use illegal drugs. Some items may interact with your medicine. ?What should I watch for while using this medication? ?Your condition will be monitored carefully while you are receiving this medicine. ?You may need blood work done while you are taking this medicine. ?This medicine may cause a decrease in vitamin B6. You should make sure that you get enough vitamin B6 while you are taking this medicine. Discuss the foods you eat and the vitamins you take with your health care professional. ?What side effects may I notice from receiving this medication? ?Side effects that you should report to your doctor or health care professional as soon as possible: ?allergic reactions like skin rash, itching or hives, swelling of the face, lips, or tongue ?seizures ?signs and symptoms of a blood clot such as breathing problems; changes in vision; chest pain; severe, sudden headache; pain, swelling, warmth in the leg; trouble speaking; sudden numbness or weakness of the face, arm or leg ?signs and symptoms of a stroke like   changes in vision; confusion; trouble speaking or understanding; severe headaches; sudden numbness or weakness of the face, arm or leg; trouble walking; dizziness; loss of balance or coordination ?Side effects that usually do not require medical attention (report to your doctor or health care professional if they continue or are  bothersome): ?chills ?cough ?dizziness ?fever ?headaches ?joint pain ?muscle cramps ?muscle pain ?nausea, vomiting ?pain, redness, or irritation at site where injected ?This list may not describe all possible side effects. Call your doctor for medical advice about side effects. You may report side effects to FDA at 1-800-FDA-1088. ?Where should I keep my medication? ?Keep out of the reach of children. ?Store in a refrigerator between 2 and 8 degrees C (36 and 46 degrees F). Do not freeze or shake. Throw away any unused portion if using a single-dose vial. Multi-dose vials can be kept in the refrigerator for up to 21 days after the initial dose. Throw away unused medicine. ?NOTE: This sheet is a summary. It may not cover all possible information. If you have questions about this medicine, talk to your doctor, pharmacist, or health care provider. ?? 2023 Elsevier/Gold Standard (2017-01-19 00:00:00) ? ?

## 2021-12-11 ENCOUNTER — Ambulatory Visit (INDEPENDENT_AMBULATORY_CARE_PROVIDER_SITE_OTHER): Payer: Medicare Other

## 2021-12-11 DIAGNOSIS — R0789 Other chest pain: Secondary | ICD-10-CM | POA: Diagnosis not present

## 2021-12-11 DIAGNOSIS — I480 Paroxysmal atrial fibrillation: Secondary | ICD-10-CM

## 2021-12-11 DIAGNOSIS — E782 Mixed hyperlipidemia: Secondary | ICD-10-CM

## 2021-12-11 DIAGNOSIS — R072 Precordial pain: Secondary | ICD-10-CM | POA: Diagnosis not present

## 2021-12-11 DIAGNOSIS — I10 Essential (primary) hypertension: Secondary | ICD-10-CM

## 2021-12-11 LAB — ECHOCARDIOGRAM COMPLETE
Area-P 1/2: 2.62 cm2
S' Lateral: 2.8 cm

## 2021-12-12 ENCOUNTER — Inpatient Hospital Stay: Payer: Medicare Other

## 2021-12-12 ENCOUNTER — Telehealth: Payer: Self-pay

## 2021-12-12 DIAGNOSIS — D469 Myelodysplastic syndrome, unspecified: Secondary | ICD-10-CM

## 2021-12-12 LAB — CBC: RBC: 2.87 — AB (ref 3.87–5.11)

## 2021-12-12 LAB — CBC AND DIFFERENTIAL
HCT: 29 — AB (ref 36–46)
Hemoglobin: 9.4 — AB (ref 12.0–16.0)
Neutrophils Absolute: 14.34
Platelets: 284 10*3/uL (ref 150–400)
WBC: 17.7

## 2021-12-12 NOTE — Telephone Encounter (Addendum)
Pt is on Eliquis and she is worried she maybe passing some blood, because her stools have been dark all week. She also reports feeling light headed.  I reported above to Dr Bobby Rumpf when he arrived to clinic this morning @ 925. He asked that pt hold Eliquis until labs done. Pt called and given appt for today.   7/14 1125 - Dr Bobby Rumpf review pt's lab. He recommends she hold Eliquis over the weekend and f/u with her cardiologist or PCP. Pt notified and she verbalized understanding. (I gave her the written instructions from Dr Bobby Rumpf).

## 2021-12-15 ENCOUNTER — Telehealth: Payer: Self-pay

## 2021-12-15 DIAGNOSIS — R931 Abnormal findings on diagnostic imaging of heart and coronary circulation: Secondary | ICD-10-CM

## 2021-12-15 DIAGNOSIS — E782 Mixed hyperlipidemia: Secondary | ICD-10-CM

## 2021-12-15 NOTE — Telephone Encounter (Signed)
Called pt with echo results and she advised she has been having dark stools for 1 week. Pt has let her oncologist know and I will attach the note below. Please advise on her Eliquis.  Pt is on Eliquis and she is worried she maybe passing some blood, because her stools have been dark all week. She also reports feeling light headed.   I reported above to Dr Bobby Rumpf when he arrived to clinic this morning @ 925. He asked that pt hold Eliquis until labs done. Pt called and given appt for today.     7/14 1125 - Dr Bobby Rumpf review pt's lab. He recommends she hold Eliquis over the weekend and f/u with her cardiologist or PCP. Pt notified and she verbalized understanding. (I gave her the written instructions from Dr Bobby Rumpf).

## 2021-12-15 NOTE — Telephone Encounter (Signed)
Pt holding Eliquis for now. She reports that she spoke to her GI MD and is seeing them on Wednesday. She will update [place] once she sees them.

## 2021-12-17 DIAGNOSIS — K5790 Diverticulosis of intestine, part unspecified, without perforation or abscess without bleeding: Secondary | ICD-10-CM | POA: Diagnosis not present

## 2021-12-17 DIAGNOSIS — K921 Melena: Secondary | ICD-10-CM | POA: Diagnosis not present

## 2021-12-17 DIAGNOSIS — K219 Gastro-esophageal reflux disease without esophagitis: Secondary | ICD-10-CM | POA: Diagnosis not present

## 2021-12-17 DIAGNOSIS — D649 Anemia, unspecified: Secondary | ICD-10-CM | POA: Diagnosis not present

## 2021-12-19 DIAGNOSIS — K259 Gastric ulcer, unspecified as acute or chronic, without hemorrhage or perforation: Secondary | ICD-10-CM | POA: Diagnosis not present

## 2021-12-19 DIAGNOSIS — K2970 Gastritis, unspecified, without bleeding: Secondary | ICD-10-CM | POA: Diagnosis not present

## 2021-12-19 DIAGNOSIS — Z950 Presence of cardiac pacemaker: Secondary | ICD-10-CM | POA: Diagnosis not present

## 2021-12-19 DIAGNOSIS — M1990 Unspecified osteoarthritis, unspecified site: Secondary | ICD-10-CM | POA: Diagnosis not present

## 2021-12-19 DIAGNOSIS — K449 Diaphragmatic hernia without obstruction or gangrene: Secondary | ICD-10-CM | POA: Diagnosis not present

## 2021-12-19 DIAGNOSIS — K921 Melena: Secondary | ICD-10-CM | POA: Diagnosis not present

## 2021-12-19 DIAGNOSIS — K222 Esophageal obstruction: Secondary | ICD-10-CM | POA: Diagnosis not present

## 2021-12-19 DIAGNOSIS — D500 Iron deficiency anemia secondary to blood loss (chronic): Secondary | ICD-10-CM | POA: Diagnosis not present

## 2021-12-19 DIAGNOSIS — I10 Essential (primary) hypertension: Secondary | ICD-10-CM | POA: Diagnosis not present

## 2021-12-19 DIAGNOSIS — Z79899 Other long term (current) drug therapy: Secondary | ICD-10-CM | POA: Diagnosis not present

## 2021-12-19 DIAGNOSIS — K219 Gastro-esophageal reflux disease without esophagitis: Secondary | ICD-10-CM | POA: Diagnosis not present

## 2021-12-19 DIAGNOSIS — D649 Anemia, unspecified: Secondary | ICD-10-CM | POA: Diagnosis not present

## 2021-12-21 ENCOUNTER — Encounter: Payer: Self-pay | Admitting: Oncology

## 2021-12-25 ENCOUNTER — Inpatient Hospital Stay: Payer: Medicare Other

## 2021-12-25 ENCOUNTER — Other Ambulatory Visit: Payer: Self-pay | Admitting: Pharmacist

## 2021-12-25 ENCOUNTER — Ambulatory Visit: Payer: Medicare Other

## 2021-12-25 ENCOUNTER — Other Ambulatory Visit: Payer: Medicare Other

## 2021-12-25 ENCOUNTER — Inpatient Hospital Stay: Payer: Medicare Other | Admitting: Hematology and Oncology

## 2021-12-25 ENCOUNTER — Ambulatory Visit: Payer: Medicare Other | Admitting: Oncology

## 2021-12-25 ENCOUNTER — Encounter: Payer: Self-pay | Admitting: Hematology and Oncology

## 2021-12-25 ENCOUNTER — Telehealth (HOSPITAL_COMMUNITY): Payer: Self-pay | Admitting: *Deleted

## 2021-12-25 VITALS — BP 128/56 | HR 64 | Temp 98.4°F | Resp 20 | Ht 63.0 in | Wt 150.7 lb

## 2021-12-25 VITALS — BP 129/50 | HR 62 | Temp 98.4°F | Resp 20 | Ht 63.0 in | Wt 150.7 lb

## 2021-12-25 DIAGNOSIS — D469 Myelodysplastic syndrome, unspecified: Secondary | ICD-10-CM

## 2021-12-25 DIAGNOSIS — D649 Anemia, unspecified: Secondary | ICD-10-CM | POA: Diagnosis not present

## 2021-12-25 DIAGNOSIS — D72829 Elevated white blood cell count, unspecified: Secondary | ICD-10-CM | POA: Diagnosis not present

## 2021-12-25 DIAGNOSIS — D46Z Other myelodysplastic syndromes: Secondary | ICD-10-CM | POA: Diagnosis not present

## 2021-12-25 LAB — CBC AND DIFFERENTIAL
HCT: 27 — AB (ref 36–46)
Hemoglobin: 8.8 — AB (ref 12.0–16.0)
Neutrophils Absolute: 14.35
Platelets: 256 10*3/uL (ref 150–400)
WBC: 18.4

## 2021-12-25 LAB — CBC
MCV: 102 — AB (ref 81–99)
RBC: 2.64 — AB (ref 3.87–5.11)

## 2021-12-25 MED ORDER — EPOETIN ALFA-EPBX 40000 UNIT/ML IJ SOLN
40000.0000 [IU] | Freq: Once | INTRAMUSCULAR | Status: AC
  Administered 2021-12-25: 40000 [IU] via SUBCUTANEOUS
  Filled 2021-12-25: qty 1

## 2021-12-25 NOTE — Progress Notes (Cosign Needed)
[place]  [address] [place],  [place]  [address] [phone]  Clinic Day:  12/25/2021  Referring physician: [name], MD   HISTORY OF PRESENT ILLNESS:  The patient is a 87 y.o. 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  Her bone marrow biopsy revealed that over 15% of her erythroid precursors are ring sideroblasts, which highlights the myelodysplastic features of her bone marrow.  Furthermore, additional studies showed that she harbors the JAK2 mutation; she also has a high white count.  These findings highlight the myeloproliferative component of her bone marrow. She also had a positive M spike and an elevated IgM level, but there were no bone marrow findings to suggest  lymphoplasmacytic lymphoma was present.    She comes in today for routine follow-up.  She currently takes hydroxyurea 500 mg every Monday, Wednesday, and Friday as her white count recently rose above 20,000.  She also receives Retacrit injections every 3 weeks for her anemia, with the goal being to get her hemoglobin at or above 11.  Earlier this month, iron, B12 and folate were all normal.  She contacted [place] about 2 weeks ago reporting melena for about 1 week, so we had her hold her Eliquis and contact Dr. Geraldo Pitter.  She then saw Dr. Lyda Jester and underwent an EGD last week which revealed antral erosions and a small antral ulcer.  The patient had discontinued Protonix on her own, so this was resumed.  She is undergoing cardiac evaluation due to having chest tightness and has a CT in [place] tomorrow.  She will see Dr. Geraldo Pitter for follow-up after that.  She states she is somewhat more fatigued and occasionally has increased dizziness and shortness of breath over baseline.  She reports upper abdominal pain.  She states the melena resolved with holding Eliquis.  She denies recurrent melena.  PHYSICAL EXAM:   Blood pressure (!) 128/56, pulse 64, temperature 98.4 F (36.9 C), temperature source Oral, resp. rate 20, height 5' 3"  (1.6 m), weight 150 lb 11.2 oz (68.4 kg), SpO2 97 %. Wt Readings from Last 3 Encounters:  12/25/21 150 lb 11.2 oz (68.4 kg)  12/04/21 150 lb (68 kg)  12/01/21 149 lb 12.8 oz (67.9 kg)   Body mass index is 26.7 kg/m.  Performance status (ECOG): 2 - Symptomatic, <50% confined to bed  Physical Exam Vitals and nursing note reviewed.  Constitutional:      General: She is not in acute distress.    Appearance: Normal appearance.  HENT:     Head: Normocephalic and atraumatic.     Mouth/Throat:     Mouth: Mucous membranes are moist.     Pharynx: Oropharynx is clear. No oropharyngeal exudate or posterior oropharyngeal erythema.  Eyes:     General: No scleral icterus.    Extraocular Movements: Extraocular movements intact.     Conjunctiva/sclera: Conjunctivae normal.     Pupils: Pupils are equal, round, and reactive to light.  Cardiovascular:     Rate and Rhythm: Normal rate and regular rhythm.     Heart sounds: Normal heart sounds. No murmur heard.    No friction rub. No gallop.  Pulmonary:     Effort: Pulmonary effort is normal.     Breath sounds: Examination of the right-lower field reveals rales. Examination of the left-lower field reveals rales. Rales present. No wheezing or rhonchi.  Abdominal:     General: There is no distension.  Palpations: Abdomen is soft. There is no hepatomegaly, splenomegaly or mass.     Tenderness: There is no abdominal tenderness.  Musculoskeletal:        General: Normal range of motion.     Cervical back: Normal range of motion and neck supple. No tenderness.     Right lower leg: No edema.     Left lower leg: No edema.  Lymphadenopathy:     Cervical: No cervical adenopathy.     Upper Body:     Right upper body: No supraclavicular or axillary adenopathy.     Left upper body: No supraclavicular or axillary adenopathy.      Lower Body: No right inguinal adenopathy. No left inguinal adenopathy.  Skin:    General: Skin is warm and dry.     Coloration: Skin is not jaundiced.     Findings: No rash.  Neurological:     Mental Status: She is alert and oriented to person, place, and time.     Cranial Nerves: No cranial nerve deficit.  Psychiatric:        Mood and Affect: Mood normal.        Behavior: Behavior normal.        Thought Content: Thought content normal.     LABS:      Latest Ref Rng & Units 12/25/2021   12:00 AM 12/12/2021   12:00 AM 12/04/2021   12:00 AM  CBC  WBC  18.4     17.7     16.7      Hemoglobin 12.0 - 16.0 8.8     9.4     10.0      Hematocrit 36 - 46 27     29     31       Platelets 150 - 400 K/uL 256     284     261         This result is from an external source.      Latest Ref Rng & Units 12/01/2021    3:16 PM 05/04/2021    5:37 AM 05/03/2021   11:00 PM  CMP  Glucose 70 - 99 mg/dL 93  108  127   BUN 8 - 27 mg/dL 22  21  24    Creatinine 0.57 - 1.00 mg/dL 0.90  0.98  1.09   Sodium 134 - 144 mmol/L 138  135  137   Potassium 3.5 - 5.2 mmol/L 4.4  3.7  3.7   Chloride 96 - 106 mmol/L 98  99  102   CO2 20 - 29 mmol/L 26  27  27    Calcium 8.7 - 10.3 mg/dL 9.4  9.0  9.4   Total Protein 6.5 - 8.1 g/dL  5.9    Total Bilirubin 0.3 - 1.2 mg/dL  0.9    Alkaline Phos 38 - 126 U/L  48    AST 15 - 41 U/L  17    ALT 0 - 44 U/L  11       No results found for: "CEA1", "CEA" / No results found for: "CEA1", "CEA" No results found for: "PSA1" No results found for: "QPY195" No results found for: "CAN125"  Lab Results  Component Value Date   TOTALPROTELP 6.5 05/02/2021   ALBUMINELP 3.8 05/02/2021   A1GS 0.2 05/02/2021   A2GS 0.6 05/02/2021   BETS 0.7 05/02/2021   GAMS 1.1 05/02/2021   MSPIKE 0.4 (H) 05/02/2021   SPEI Comment 05/02/2021   Lab Results  Component  Value Date   TIBC 278 12/04/2021   TIBC 293 05/02/2021   FERRITIN 141 12/04/2021   FERRITIN 176 05/02/2021   IRONPCTSAT 33  (H) 12/04/2021   IRONPCTSAT 22 05/02/2021   Lab Results  Component Value Date   LDH 232 (H) 05/02/2021       Component Value Date/Time   TOTALPROTELP 6.5 05/02/2021 1453   ALBUMINELP 3.8 05/02/2021 1333   A1GS 0.2 05/02/2021 1333   A2GS 0.6 05/02/2021 1333   BETS 0.7 05/02/2021 1333   GAMS 1.1 05/02/2021 1333   MSPIKE 0.4 (H) 05/02/2021 1333   SPEI Comment 05/02/2021 1333   LDH 232 (H) 05/02/2021 1337   IGGSERUM 604 05/02/2021 1453   IGMSERUM 805 (H) 05/02/2021 1453    Review Flowsheet       Latest Ref Rng & Units 05/02/2021 12/04/2021  Oncology Labs  Ferritin 11 - 307 ng/mL 176  141   %SAT 10.4 - 31.8 % 22  33   Total Protein ELP 6.0 - 8.5 g/dL 6.5  C 6.5  -  Albumin ELP 2.9 - 4.4 g/dL 3.8  -  Alpha-1 Globulin 0.0 - 0.4 g/dL 0.2  -  Alpha-2 Globulin 0.4 - 1.0 g/dL 0.6  -  Beta Globulin 0.7 - 1.3 g/dL 0.7  -  Gamma Globulin 0.4 - 1.8 g/dL 1.1  -  M-Spike, % Not Observed g/dL 0.4  -  SPE Interp. - Comment  -  LDH 98 - 192 U/L 232  -  IgG (Immunoglobin G), Serum 586 - 1,602 mg/dL 604  -  IgM, Serum 26 - 217 mg/dL 805  -    Details      C Corrected result   Multiple values from one day are sorted in reverse-chronological order          STUDIES:  ECHOCARDIOGRAM COMPLETE  Result Date: 12/11/2021    ECHOCARDIOGRAM REPORT   Patient Name:   Alexandra Kim Date of Exam: 12/11/2021 Medical Rec #:  [id number]      Height:       63.0 in Accession #:    [id number]     Weight:       150.0 lb Date of Birth:  11/19/1934       BSA:          29.711 m Patient Age:    52 years       BP:           121/58 mmHg Patient Gender: F              HR:           66 bpm. Exam Location:  [facility] Procedure: 2D Echo, Cardiac Doppler, Color Doppler and Strain Analysis Indications:    Atrial Fibrillation I48.91  History:        Patient has prior history of Echocardiogram examinations, most                 recent 02/18/2021. Arrythmias:Tachy-brady syndrome,                 Signs/Symptoms:Chest Pain; Risk  Factors:Hypertension.  Sonographer:    Luane School RDCS Referring Phys: [place] IMPRESSIONS  1. Left ventricular ejection fraction, by estimation, is 60 to 65%. The left ventricle has normal function. The left ventricle has no regional wall motion abnormalities. There is mild concentric left ventricular hypertrophy. Left ventricular diastolic parameters are consistent with Grade I diastolic dysfunction (impaired relaxation). The average left ventricular global longitudinal  strain is -16.3 %.  2. Right ventricular systolic function is normal. The right ventricular size is normal. There is mildly elevated pulmonary artery systolic pressure.  3. The mitral valve is normal in structure. No evidence of mitral valve regurgitation. No evidence of mitral stenosis.  4. The aortic valve is tricuspid. Aortic valve regurgitation is not visualized. No aortic stenosis is present.  5. Aortic Normal DTA. There is mild dilatation of the ascending aorta, measuring 38 mm.  6. The inferior vena cava is normal in size with greater than 50% respiratory variability, suggesting right atrial pressure of 3 mmHg. FINDINGS  Left Ventricle: Left ventricular ejection fraction, by estimation, is 60 to 65%. The left ventricle has normal function. The left ventricle has no regional wall motion abnormalities. The average left ventricular global longitudinal strain is -16.3 %. The left ventricular internal cavity size was normal in size. There is mild concentric left ventricular hypertrophy. Left ventricular diastolic parameters are consistent with Grade I diastolic dysfunction (impaired relaxation). Normal left ventricular filling pressure. Right Ventricle: The right ventricular size is normal. No increase in right ventricular wall thickness. Right ventricular systolic function is normal. There is mildly elevated pulmonary artery systolic pressure. The tricuspid regurgitant velocity is 2.97  m/s, and with an assumed right atrial  pressure of 3 mmHg, the estimated right ventricular systolic pressure is 81.8 mmHg. Left Atrium: Left atrial size was normal in size. Right Atrium: Right atrial size was normal in size. Pericardium: There is no evidence of pericardial effusion. Mitral Valve: The mitral valve is normal in structure. No evidence of mitral valve regurgitation. No evidence of mitral valve stenosis. Tricuspid Valve: The tricuspid valve is normal in structure. Tricuspid valve regurgitation is mild . No evidence of tricuspid stenosis. Aortic Valve: The aortic valve is tricuspid. Aortic valve regurgitation is not visualized. No aortic stenosis is present. Pulmonic Valve: The pulmonic valve was normal in structure. Pulmonic valve regurgitation is not visualized. No evidence of pulmonic stenosis. Aorta: The aortic root is normal in size and structure, the aortic arch was not well visualized and Normal DTA. There is mild dilatation of the ascending aorta, measuring 38 mm. Venous: A normal flow pattern is recorded from the right upper pulmonary vein. The inferior vena cava is normal in size with greater than 50% respiratory variability, suggesting right atrial pressure of 3 mmHg. IAS/Shunts: No atrial level shunt detected by color flow Doppler.  LEFT VENTRICLE PLAX 2D LVIDd:         4.20 cm   Diastology LVIDs:         2.80 cm   LV e' medial:    7.07 cm/s LV PW:         1.10 cm   LV E/e' medial:  13.3 LV IVS:        1.10 cm   LV e' lateral:   10.25 cm/s LVOT diam:     2.00 cm   LV E/e' lateral: 9.2 LV SV:         99 LV SV Index:   58        2D Longitudinal Strain LVOT Area:     3.14 cm  2D Strain GLS Avg:     -16.3 %  RIGHT VENTRICLE             IVC RV S prime:     12.00 cm/s  IVC diam: 1.40 cm TAPSE (M-mode): 2.4 cm LEFT ATRIUM  Index        RIGHT ATRIUM           Index LA diam:        3.50 cm 2.05 cm/m   RA Area:     15.40 cm LA Vol (A2C):   49.5 ml 28.93 ml/m  RA Volume:   38.00 ml  22.21 ml/m LA Vol (A4C):   35.8 ml 20.92  ml/m LA Biplane Vol: 42.6 ml 24.90 ml/m  AORTIC VALVE LVOT Vmax:   127.00 cm/s LVOT Vmean:  89.900 cm/s LVOT VTI:    0.315 m  AORTA Ao Root diam: 3.50 cm Ao Asc diam:  3.80 cm Ao Desc diam: 1.90 cm MITRAL VALVE               TRICUSPID VALVE MV Area (PHT): 2.62 cm    TR Peak grad:   35.3 mmHg MV Decel Time: 290 msec    TR Vmax:        297.00 cm/s MV E velocity: 94.30 cm/s MV A velocity: 98.10 cm/s  SHUNTS MV E/A ratio:  0.96        Systemic VTI:  0.32 m                            Systemic Diam: 2.00 cm Shirlee More MD Electronically signed by Shirlee More MD Signature Date/Time: 12/11/2021/5:39:28 PM    Final       ASSESSMENT & PLAN:   Assessment/Plan:  A 87 y.o. female whose abnormal counts appear to be due to a myeloid neoplasm which has both myelodysplastic/myeloproliferative characteristics.  She has worsening anemia despite Retacrit 20,000 units every 3 weeks.  This may be due to her treatment with hydroxyurea or possibly progressive disease.  I would like to increase her Retacrit to 40,000 units every 2 weeks to see if we can get a better response.  For now, I will keep her on hydroxyurea 3 times a week, as she has persistent leukocytosis, although, her white blood cell count remains under 20,000.  Her CBC will be checked again in 2 weeks prior to her next Retacrit injection.  I will plan to see her back in 4 weeks for repeat clinical assessment. The patient understands all the plans discussed today and is in agreement with them.  She knows to contact our office if she has symptoms of worsening anemia or other concerns prior to her next appointment.    Marvia Pickles, PA-C

## 2021-12-25 NOTE — Patient Instructions (Signed)
Epoetin Alfa injection ?What is this medication? ?EPOETIN ALFA (e POE e tin AL fa) helps your body make more red blood cells. This medicine is used to treat anemia caused by chronic kidney disease, cancer chemotherapy, or HIV-therapy. It may also be used before surgery if you have anemia. ?This medicine may be used for other purposes; ask your health care provider or pharmacist if you have questions. ?COMMON BRAND NAME(S): Epogen, Procrit, Retacrit ?What should I tell my care team before I take this medication? ?They need to know if you have any of these conditions: ?cancer ?heart disease ?high blood pressure ?history of blood clots ?history of stroke ?low levels of folate, iron, or vitamin B12 in the blood ?seizures ?an unusual or allergic reaction to erythropoietin, albumin, benzyl alcohol, hamster proteins, other medicines, foods, dyes, or preservatives ?pregnant or trying to get pregnant ?breast-feeding ?How should I use this medication? ?This medicine is for injection into a vein or under the skin. It is usually given by a health care professional in a hospital or clinic setting. ?If you get this medicine at home, you will be taught how to prepare and give this medicine. Use exactly as directed. Take your medicine at regular intervals. Do not take your medicine more often than directed. ?It is important that you put your used needles and syringes in a special sharps container. Do not put them in a trash can. If you do not have a sharps container, call your pharmacist or healthcare provider to get one. ?A special MedGuide will be given to you by the pharmacist with each prescription and refill. Be sure to read this information carefully each time. ?Talk to your pediatrician regarding the use of this medicine in children. While this drug may be prescribed for selected conditions, precautions do apply. ?Overdosage: If you think you have taken too much of this medicine contact a poison control center or emergency  room at once. ?NOTE: This medicine is only for you. Do not share this medicine with others. ?What if I miss a dose? ?If you miss a dose, take it as soon as you can. If it is almost time for your next dose, take only that dose. Do not take double or extra doses. ?What may interact with this medication? ?Interactions have not been studied. ?This list may not describe all possible interactions. Give your health care provider a list of all the medicines, herbs, non-prescription drugs, or dietary supplements you use. Also tell them if you smoke, drink alcohol, or use illegal drugs. Some items may interact with your medicine. ?What should I watch for while using this medication? ?Your condition will be monitored carefully while you are receiving this medicine. ?You may need blood work done while you are taking this medicine. ?This medicine may cause a decrease in vitamin B6. You should make sure that you get enough vitamin B6 while you are taking this medicine. Discuss the foods you eat and the vitamins you take with your health care professional. ?What side effects may I notice from receiving this medication? ?Side effects that you should report to your doctor or health care professional as soon as possible: ?allergic reactions like skin rash, itching or hives, swelling of the face, lips, or tongue ?seizures ?signs and symptoms of a blood clot such as breathing problems; changes in vision; chest pain; severe, sudden headache; pain, swelling, warmth in the leg; trouble speaking; sudden numbness or weakness of the face, arm or leg ?signs and symptoms of a stroke like   changes in vision; confusion; trouble speaking or understanding; severe headaches; sudden numbness or weakness of the face, arm or leg; trouble walking; dizziness; loss of balance or coordination ?Side effects that usually do not require medical attention (report to your doctor or health care professional if they continue or are  bothersome): ?chills ?cough ?dizziness ?fever ?headaches ?joint pain ?muscle cramps ?muscle pain ?nausea, vomiting ?pain, redness, or irritation at site where injected ?This list may not describe all possible side effects. Call your doctor for medical advice about side effects. You may report side effects to FDA at 1-800-FDA-1088. ?Where should I keep my medication? ?Keep out of the reach of children. ?Store in a refrigerator between 2 and 8 degrees C (36 and 46 degrees F). Do not freeze or shake. Throw away any unused portion if using a single-dose vial. Multi-dose vials can be kept in the refrigerator for up to 21 days after the initial dose. Throw away unused medicine. ?NOTE: This sheet is a summary. It may not cover all possible information. If you have questions about this medicine, talk to your doctor, pharmacist, or health care provider. ?? 2023 Elsevier/Gold Standard (2017-01-19 00:00:00) ? ?

## 2021-12-25 NOTE — Telephone Encounter (Signed)
Left message to call back  

## 2021-12-25 NOTE — Telephone Encounter (Signed)
Reaching out to patient to offer assistance regarding upcoming cardiac imaging study; pt verbalizes understanding of appt date/time, parking situation and where to check in, pre-test NPO status and verified current allergies; name and call back number provided for further questions should they arise  Alexandra Clement RN Navigator Cardiac Imaging [facility] [phone] office [phone] cell  Patient to take her daily medications. She is aware to arrive at 11:30am.

## 2021-12-26 ENCOUNTER — Ambulatory Visit (HOSPITAL_COMMUNITY)
Admission: RE | Admit: 2021-12-26 | Discharge: 2021-12-26 | Disposition: A | Discharge status: Home / Self Care | Payer: Medicare Other | Source: Ambulatory Visit | Attending: Cardiology | Admitting: Cardiology

## 2021-12-26 DIAGNOSIS — R072 Precordial pain: Secondary | ICD-10-CM | POA: Insufficient documentation

## 2021-12-26 MED ORDER — IOHEXOL 350 MG/ML SOLN
100.0000 mL | Freq: Once | INTRAVENOUS | Status: AC | PRN
  Administered 2021-12-26: 100 mL via INTRAVENOUS

## 2021-12-26 MED ORDER — NITROGLYCERIN 0.4 MG SL SUBL
SUBLINGUAL_TABLET | SUBLINGUAL | Status: AC
  Filled 2021-12-26: qty 2

## 2021-12-26 MED ORDER — NITROGLYCERIN 0.4 MG SL SUBL
0.8000 mg | SUBLINGUAL_TABLET | Freq: Once | SUBLINGUAL | Status: AC
  Administered 2021-12-26: 0.8 mg via SUBLINGUAL

## 2022-01-01 ENCOUNTER — Ambulatory Visit: Payer: Medicare Other

## 2022-01-01 DIAGNOSIS — R931 Abnormal findings on diagnostic imaging of heart and coronary circulation: Secondary | ICD-10-CM | POA: Diagnosis not present

## 2022-01-01 DIAGNOSIS — E782 Mixed hyperlipidemia: Secondary | ICD-10-CM | POA: Diagnosis not present

## 2022-01-01 NOTE — Addendum Note (Signed)
Addended by: Truddie Hidden on: 01/01/2022 10:24 AM   Modules accepted: Orders

## 2022-01-02 LAB — HEPATIC FUNCTION PANEL
ALT: 8 IU/L (ref 0–32)
AST: 14 IU/L (ref 0–40)
Albumin: 4.2 g/dL (ref 3.7–4.7)
Alkaline Phosphatase: 50 IU/L (ref 44–121)
Bilirubin Total: 0.6 mg/dL (ref 0.0–1.2)
Bilirubin, Direct: 0.2 mg/dL (ref 0.00–0.40)
Total Protein: 6.3 g/dL (ref 6.0–8.5)

## 2022-01-02 LAB — BASIC METABOLIC PANEL
BUN/Creatinine Ratio: 24 (ref 12–28)
BUN: 23 mg/dL (ref 8–27)
CO2: 29 mmol/L (ref 20–29)
Calcium: 9.6 mg/dL (ref 8.7–10.3)
Chloride: 100 mmol/L (ref 96–106)
Creatinine, Ser: 0.94 mg/dL (ref 0.57–1.00)
Glucose: 93 mg/dL (ref 70–99)
Potassium: 4.3 mmol/L (ref 3.5–5.2)
Sodium: 139 mmol/L (ref 134–144)
eGFR: 59 mL/min/{1.73_m2} — ABNORMAL LOW (ref >59)

## 2022-01-02 LAB — LIPID PANEL
Chol/HDL Ratio: 3.2 ratio (ref 0.0–4.4)
Cholesterol, Total: 110 mg/dL (ref 100–199)
HDL: 34 mg/dL — ABNORMAL LOW (ref >39)
LDL Chol Calc (NIH): 57 mg/dL (ref 0–99)
Triglycerides: 102 mg/dL (ref 0–149)
VLDL Cholesterol Cal: 19 mg/dL (ref 5–40)

## 2022-01-05 ENCOUNTER — Telehealth: Payer: Self-pay | Admitting: Cardiology

## 2022-01-05 NOTE — Telephone Encounter (Signed)
Patient is requesting to discuss lab results.

## 2022-01-05 NOTE — Telephone Encounter (Signed)
Patient notified that labs have not been resulted yet. Pt aware that office will call her with results.   Will fwd to provider.

## 2022-01-08 ENCOUNTER — Inpatient Hospital Stay: Payer: Medicare Other

## 2022-01-08 ENCOUNTER — Inpatient Hospital Stay: Payer: Medicare Other | Attending: Oncology

## 2022-01-08 VITALS — BP 133/54 | HR 71 | Temp 98.9°F | Resp 20 | Ht 63.0 in | Wt 150.0 lb

## 2022-01-08 DIAGNOSIS — D469 Myelodysplastic syndrome, unspecified: Secondary | ICD-10-CM

## 2022-01-08 DIAGNOSIS — D649 Anemia, unspecified: Secondary | ICD-10-CM | POA: Diagnosis not present

## 2022-01-08 DIAGNOSIS — D46Z Other myelodysplastic syndromes: Secondary | ICD-10-CM | POA: Insufficient documentation

## 2022-01-08 LAB — CBC AND DIFFERENTIAL
HCT: 29 — AB (ref 36–46)
Hemoglobin: 9.5 — AB (ref 12.0–16.0)
Neutrophils Absolute: 14.95
Platelets: 291 10*3/uL (ref 150–400)
WBC: 17.8

## 2022-01-08 LAB — CBC: RBC: 2.82 — AB (ref 3.87–5.11)

## 2022-01-08 MED ORDER — EPOETIN ALFA-EPBX 40000 UNIT/ML IJ SOLN
40000.0000 [IU] | Freq: Once | INTRAMUSCULAR | Status: AC
  Administered 2022-01-08: 40000 [IU] via SUBCUTANEOUS
  Filled 2022-01-08: qty 1

## 2022-01-08 NOTE — Patient Instructions (Signed)

## 2022-01-09 ENCOUNTER — Other Ambulatory Visit: Payer: Self-pay

## 2022-01-12 ENCOUNTER — Ambulatory Visit: Payer: Medicare Other | Admitting: Cardiology

## 2022-01-12 ENCOUNTER — Encounter: Payer: Self-pay | Admitting: Cardiology

## 2022-01-12 VITALS — BP 130/68 | HR 72 | Ht 63.0 in | Wt 151.6 lb

## 2022-01-12 DIAGNOSIS — I10 Essential (primary) hypertension: Secondary | ICD-10-CM | POA: Diagnosis not present

## 2022-01-12 DIAGNOSIS — I480 Paroxysmal atrial fibrillation: Secondary | ICD-10-CM

## 2022-01-12 DIAGNOSIS — E782 Mixed hyperlipidemia: Secondary | ICD-10-CM

## 2022-01-12 MED ORDER — VALSARTAN-HYDROCHLOROTHIAZIDE 320-25 MG PO TABS: 0.5000 | ORAL_TABLET | Freq: Every day | ORAL | 3 refills | Status: DC

## 2022-01-12 NOTE — Patient Instructions (Addendum)
Please keep a BP log for 2 weeks and send by MyChart or mail.  Blood Pressure Record Sheet To take your blood pressure, you will need a blood pressure machine. You can buy a blood pressure machine (blood pressure monitor) at your clinic, drug store, or online. When choosing one, consider: An automatic monitor that has an arm cuff. A cuff that wraps snugly around your upper arm. You should be able to fit only one finger between your arm and the cuff. A device that stores blood pressure reading results. Do not choose a monitor that measures your blood pressure from your wrist or finger. Follow your health care provider's instructions for how to take your blood pressure. To use this form: Get one reading in the morning (a.m.) 1-2 hours after you take any medicines. Get one reading in the evening (p.m.) before supper. Write down the results in the spaces on this form. Repeat this once a week, or as told by your health care provider.  Make a follow-up appointment with your health care provider to discuss the results. Blood pressure log  Date: _______________________  a.m. _____________________(1st reading) HR___________            p.m. _____________________(2nd reading) HR__________  Date: _______________________  a.m. _____________________(1st reading) HR___________            p.m. _____________________(2nd reading) HR__________  Date: _______________________  a.m. _____________________(1st reading) HR___________            p.m. _____________________(2nd reading) HR__________  Date: _______________________  a.m. _____________________(1st reading) HR___________            p.m. _____________________(2nd reading) HR__________  Date: _______________________  a.m. _____________________(1st reading) HR___________            p.m. _____________________(2nd reading) HR__________  Date: _______________________  a.m. _____________________(1st reading) HR___________            p.m.  _____________________(2nd reading) HR__________  Date: _______________________  a.m. _____________________(1st reading) HR___________            p.m. _____________________(2nd reading) HR__________   This information is not intended to replace advice given to you by your health care provider. Make sure you discuss any questions you have with your health care provider. Document Revised: 09/06/2019 Document Reviewed: 09/06/2019 Elsevier Patient Education  [employer].    Medication Instructions:  Your physician has recommended you make the following change in your medication:   Decrease your Valsartan/Hydrochlorothiazide to 1/2 tablet daily.   *If you need a refill on your cardiac medications before your next appointment, please call your pharmacy*   Lab Work: None ordered If you have labs (blood work) drawn today and your tests are completely normal, you will receive your results only by: [place] (if you have MyChart) OR A paper copy in the mail If you have any lab test that is abnormal or we need to change your treatment, we will call you to review the results.   Testing/Procedures: None ordered   Follow-Up: At [facility], you and your health needs are our priority.  As part of our continuing mission to provide you with exceptional heart care, we have created designated Provider Care Teams.  These Care Teams include your primary Cardiologist (physician) and Advanced Practice Providers (APPs -  Physician Assistants and Nurse Practitioners) who all work together to provide you with the care you need, when you need it.  We recommend signing up for the patient portal called "MyChart".  Sign up information is provided on this After  Visit Summary.  MyChart is used to connect with patients for Virtual Visits (Telemedicine).  Patients are able to view lab/test results, encounter notes, upcoming appointments, etc.  Non-urgent messages can be sent to your provider as  well.   To learn more about what you can do with MyChart, go to [email or web address].    Your next appointment:   6 month(s)  The format for your next appointment:   In Person  Provider:   Jyl Heinz, MD   Other Instructions NA

## 2022-01-12 NOTE — Progress Notes (Signed)
Cardiology Office Note:    Date:  01/12/2022   ID:  Alexandra Kim, DOB May 19, 1935, MRN [id number]  PCP:  [name], MD  Cardiologist:  Jenean Lindau, MD   Referring MD: [name], MD    ASSESSMENT:    1. AF (paroxysmal atrial fibrillation) ([place])   2. Essential hypertension   3. Mixed hyperlipidemia    PLAN:    In order of problems listed above:  Atherosclerosis of the aorta: Secondary prevention stressed with the patient.  Importance of compliance with diet medication stressed and she vocalized understanding.  In view of her advanced age and multiple comorbidities she is not keen on statin therapy and I respect her wishes. Paroxysmal atrial fibrillation:I discussed with the patient atrial fibrillation, disease process. Management and therapy including rate and rhythm control, anticoagulation benefits and potential risks were discussed extensively with the patient. Patient had multiple questions which were answered to patient's satisfaction. Essential hypertension: Patient mentions to me that she feels a little lightheaded at times.  I wonder if her blood pressure is low.  She mentions her blood pressure to be in the 120s.  I have cut down her valsartan hydrochlorothiazide to half dose.  She will keep a log of her blood pressures for the next 2 weeks and get back to [place].  I will intervene if necessary at that point. Mixed dyslipidemia: She prefers to do diet and address this.  I explained benefits risks and she understands. History of blood dyscrasia: Followed by our oncology specialists. Patient will be seen in follow-up appointment in 6 months or earlier if the patient has any concerns    Medication Adjustments/Labs and Tests Ordered: Current medicines are reviewed at length with the patient today.  Concerns regarding medicines are outlined above.  No orders of the defined types were placed in this encounter.  No orders of the defined types were placed in this  encounter.    No chief complaint on file.    History of Present Illness:    Alexandra Kim is a 87 y.o. female.  Patient has past medical history of paroxysmal atrial fibrillation, essential hypertension and dyslipidemia.  She has aortic atherosclerosis.  She denies any problems at this time and takes care of activities of daily living.  No chest pain orthopnea or PND.  She has myelodysplasia followed by hematologist.  At the time of my evaluation, the patient is alert awake oriented and in no distress.  Past Medical History:  Diagnosis Date   Acute deep vein thrombosis (DVT) of left upper extremity (HCC) 05/04/2021   AF (paroxysmal atrial fibrillation) ([place]) 02/18/2021   Anemia 04/18/2021   Chest pain in adult 08/30/2018   Chronic venous insufficiency 02/13/2019   Closed displaced fracture of head of right radius 11/30/2016   Displacement of lumbar intervertebral disc without myelopathy 08/30/2018   Essential hypertension 08/30/2018   GERD without esophagitis 08/30/2018   Hyperlipidemia    Leukocytosis 04/18/2021   Lumbar radiculopathy 08/30/2018   MDS/MPN (myelodysplastic/myeloproliferative neoplasms) ([place]) 06/22/2021   Monoclonal paraproteinemia 06/03/2021   Osteopenia    Pre-syncope 02/18/2021   Tachy-brady syndrome ([place]) 02/18/2021   Vitamin D deficiency     Past Surgical History:  Procedure Laterality Date   ABDOMINAL HYSTERECTOMY  1990   PACEMAKER IMPLANT N/A 02/18/2021   Procedure: PACEMAKER IMPLANT;  Surgeon: Thompson Grayer, MD;  Location: [facility];  Service: Cardiovascular;  Laterality: N/A;    Current Medications: Current Meds  Medication Sig  cholecalciferol (VITAMIN D) 1000 UNITS tablet Take 1,000 Units by mouth daily.   ELIQUIS 5 MG TABS tablet Take 1 tablet (5 mg total) by mouth 2 (two) times daily.   hydroxyurea (HYDREA) 500 MG capsule One tab PO every Monday, Wednesday, Friday   metoprolol succinate (TOPROL-XL) 50 MG 24 hr tablet Take 1.5 tablets (75 mg  total) by mouth at bedtime.   multivitamin-lutein (OCUVITE-LUTEIN) CAPS capsule Take 1 capsule by mouth in the morning and at bedtime.   pantoprazole (PROTONIX) 40 MG tablet Take 40 mg by mouth daily as needed (Acid reflux).   valsartan-hydrochlorothiazide (DIOVAN-HCT) 320-25 MG tablet Take 1 tablet by mouth daily.     Allergies:   Ace inhibitors   Social History   Socioeconomic History   Marital status: Widowed    Spouse name: Not on file   Number of children: Not on file   Years of education: Not on file   Highest education level: Not on file  Occupational History   Not on file  Tobacco Use   Smoking status: Former    Types: Cigarettes    Quit date: 01/13/2007    Years since quitting: 15.0   Smokeless tobacco: Never  Vaping Use   Vaping Use: Never used  Substance and Sexual Activity   Alcohol use: No   Drug use: Never   Sexual activity: Not on file  Other Topics Concern   Not on file  Social History Narrative   Not on file   Social Determinants of Health   Financial Resource Strain: Not on file  Food Insecurity: Not on file  Transportation Needs: Not on file  Physical Activity: Not on file  Stress: Not on file  Social Connections: Not on file     Family History: The patient's family history includes Breast cancer in her sister; Diabetes in her mother; Heart disease in her mother; Hypertension in her mother; Lung cancer in her father.  ROS:   Please see the history of present illness.    All other systems reviewed and are negative.  EKGs/Labs/Other Studies Reviewed:    The following studies were reviewed today: I discussed my findings with the patient at length.   Recent Labs: 02/18/2021: TSH 3.279 05/04/2021: Magnesium 1.8 05/07/2021: NT-Pro BNP 541 01/01/2022: ALT 8; BUN 23; Creatinine, Ser 0.94; Potassium 4.3; Sodium 139 01/08/2022: Hemoglobin 9.5; Platelets 291  Recent Lipid Panel    Component Value Date/Time   CHOL 110 01/01/2022 1031   TRIG 102  01/01/2022 1031   HDL 34 (L) 01/01/2022 1031   CHOLHDL 3.2 01/01/2022 1031   CHOLHDL 3.3 02/19/2021 0212   VLDL 16 02/19/2021 0212   LDLCALC 57 01/01/2022 1031    Physical Exam:    VS:  BP 130/68   Pulse 72   Ht '5\' 3"'$  (1.6 m)   Wt 151 lb 9.6 oz (68.8 kg)   SpO2 92%   BMI 26.85 kg/m     Wt Readings from Last 3 Encounters:  01/12/22 151 lb 9.6 oz (68.8 kg)  01/08/22 150 lb 0.6 oz (68.1 kg)  12/25/21 150 lb 11.2 oz (68.4 kg)     GEN: Patient is in no acute distress HEENT: Normal NECK: No JVD; No carotid bruits LYMPHATICS: No lymphadenopathy CARDIAC: Hear sounds regular, 2/6 systolic murmur at the apex. RESPIRATORY:  Clear to auscultation without rales, wheezing or rhonchi  ABDOMEN: Soft, non-tender, non-distended MUSCULOSKELETAL:  No edema; No deformity  SKIN: Warm and dry NEUROLOGIC:  Alert and oriented x 3  PSYCHIATRIC:  Normal affect   Signed, Jenean Lindau, MD  01/12/2022 4:40 PM    [facility]

## 2022-01-12 NOTE — Addendum Note (Signed)
Addended by: Truddie Hidden on: 01/12/2022 04:54 PM   Modules accepted: Orders

## 2022-01-13 NOTE — Telephone Encounter (Signed)
Pt reports dark stools have resolved, she is back on her Eliquis. She will contact office if reoccurs.  She appreciates my follow up

## 2022-01-20 ENCOUNTER — Telehealth: Payer: Self-pay | Admitting: Cardiology

## 2022-01-20 NOTE — Telephone Encounter (Signed)
New Message        Patient  says she have a form that she needs Dr Geraldo Pitter to sign so she can get help with her Eliquis. She wants to know when she can come to get this signed please?  Patient says she is on her way to drop it off to the office.

## 2022-01-21 NOTE — Progress Notes (Signed)
[facility]  [address] [place],  [place]  [id number] [phone]  Clinic Day:  01/22/2022  Referring physician: [name], MD  HISTORY OF PRESENT ILLNESS:  The patient is an 87 y.o. 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  Her bone marrow biopsy revealed that over 15% of her erythroid precursors are ring sideroblasts, which highlights the myelodysplastic features of her bone marrow.  Furthermore, additional studies showed that she harbors the JAK2 mutation; she also has a high white count.  These findings highlight the myeloproliferative component of her bone marrow. She also had a positive M spike and an elevated IgM level, but there were no bone marrow findings to suggest  lymphoplasmacytic lymphoma was present.  She comes in today   Since her last visit, the patient has been doing fairly well.  She currently takes hydroxyurea 500 mg every Monday, Wednesday, and Friday as her white count recently rose above 20,000.  She also receives Retacrit injections every 2 weeks for her anemia, with the goal being to get her hemoglobin to/above 10.  She denies having increased fatigue or other findings which concern her for possible disease progression.  PHYSICAL EXAM:  Blood pressure (!) 168/70, pulse 75, temperature 98.5 F (36.9 C), resp. rate 16, height 5' 3"  (1.6 m), weight 149 lb 8 oz (67.8 kg), SpO2 92 %. Wt Readings from Last 3 Encounters:  01/22/22 149 lb 8 oz (67.8 kg)  01/12/22 151 lb 9.6 oz (68.8 kg)  01/08/22 150 lb 0.6 oz (68.1 kg)   Body mass index is 26.48 kg/m. Performance status (ECOG): 1 - Symptomatic but completely ambulatory Physical Exam Constitutional:      Appearance: Normal appearance. She is not ill-appearing.  HENT:     Mouth/Throat:     Mouth: Mucous membranes are moist.     Pharynx: Oropharynx is clear. No oropharyngeal exudate or posterior  oropharyngeal erythema.  Cardiovascular:     Rate and Rhythm: Normal rate and regular rhythm.     Heart sounds: No murmur heard.    No friction rub. No gallop.  Pulmonary:     Effort: Pulmonary effort is normal. No respiratory distress.     Breath sounds: Normal breath sounds. No wheezing, rhonchi or rales.  Abdominal:     General: Bowel sounds are normal. There is no distension.     Palpations: Abdomen is soft. There is no mass.     Tenderness: There is no abdominal tenderness.  Musculoskeletal:        General: No swelling.     Right lower leg: No edema.     Left lower leg: No edema.  Lymphadenopathy:     Cervical: No cervical adenopathy.     Upper Body:     Right upper body: No supraclavicular or axillary adenopathy.     Left upper body: No supraclavicular or axillary adenopathy.     Lower Body: No right inguinal adenopathy. No left inguinal adenopathy.  Skin:    General: Skin is warm.     Coloration: Skin is not jaundiced.     Findings: No lesion or rash.  Neurological:     General: No focal deficit present.     Mental Status: She is alert and oriented to person, place, and time. Mental status is at baseline.  Psychiatric:        Mood and Affect: Mood normal.        Behavior: Behavior  normal.        Thought Content: Thought content normal.    LABS:    Latest Reference Range & Units 01/22/22 00:00  WBC  15.4 (E)  RBC 3.87 - 5.11  2.79 ! (E)  Hemoglobin 12.0 - 16.0  9.1 ! (E)  HCT 36 - 46  28 ! (E)  Platelets 150 - 400 K/uL 219 (E)  NEUT#  12.63 (E)  !: Data is abnormal (E): External lab result  ASSESSMENT & PLAN:  An 87 y.o. female who has myelodysplastic/myeloproliferative disease.  When evaluating her labs today, I am pleased as her white count remains below 20.  Based upon this, she will continue to take hydroxyurea 500 mg every Monday, Wednesday, and Friday.  As it pertains to her anemia, her hemoglobin continues to fall despite taking Retacrit every 2 weeks.   Over the past few months, her Retacrit has not been particularly effective in improving her hemoglobin.  As her bone marrow showed over 15% ring sideroblasts, I would like to start her on Luspatercept, which is particularly effective with the ring sideroblast myelodysplasia subtype.  If Luspatercept does not get approved, I would increase her Retacrit to weekly injections, though I doubt how effective this would work.  Moving forward, her CBC will continue to be followed every 4 weeks.  I will see her back in 8 weeks for repeat clinical assessment. The patient understands all the plans discussed today and is in agreement with them.   Shanda Cadotte Macarthur Critchley, MD

## 2022-01-22 ENCOUNTER — Other Ambulatory Visit: Payer: Self-pay | Admitting: Pharmacist

## 2022-01-22 ENCOUNTER — Ambulatory Visit: Payer: Medicare Other

## 2022-01-22 ENCOUNTER — Inpatient Hospital Stay (HOSPITAL_BASED_OUTPATIENT_CLINIC_OR_DEPARTMENT_OTHER): Payer: Medicare Other | Admitting: Oncology

## 2022-01-22 ENCOUNTER — Inpatient Hospital Stay: Payer: Medicare Other

## 2022-01-22 ENCOUNTER — Other Ambulatory Visit: Payer: Self-pay | Admitting: Oncology

## 2022-01-22 VITALS — BP 133/55 | HR 66 | Temp 98.0°F | Resp 18

## 2022-01-22 VITALS — BP 168/70 | HR 75 | Temp 98.5°F | Resp 16 | Ht 63.0 in | Wt 149.5 lb

## 2022-01-22 DIAGNOSIS — D46Z Other myelodysplastic syndromes: Secondary | ICD-10-CM | POA: Diagnosis not present

## 2022-01-22 DIAGNOSIS — D643 Other sideroblastic anemias: Secondary | ICD-10-CM | POA: Insufficient documentation

## 2022-01-22 DIAGNOSIS — D469 Myelodysplastic syndrome, unspecified: Secondary | ICD-10-CM

## 2022-01-22 DIAGNOSIS — D649 Anemia, unspecified: Secondary | ICD-10-CM | POA: Diagnosis not present

## 2022-01-22 HISTORY — DX: Other sideroblastic anemias: D64.3

## 2022-01-22 LAB — CBC AND DIFFERENTIAL
HCT: 28 — AB (ref 36–46)
Hemoglobin: 9.1 — AB (ref 12.0–16.0)
Neutrophils Absolute: 12.63
Platelets: 219 10*3/uL (ref 150–400)
WBC: 15.4

## 2022-01-22 LAB — CBC: RBC: 2.79 — AB (ref 3.87–5.11)

## 2022-01-22 MED ORDER — EPOETIN ALFA-EPBX 40000 UNIT/ML IJ SOLN
40000.0000 [IU] | Freq: Once | INTRAMUSCULAR | Status: AC
  Administered 2022-01-22: 40000 [IU] via SUBCUTANEOUS
  Filled 2022-01-22: qty 1

## 2022-01-22 NOTE — Patient Instructions (Signed)

## 2022-01-23 ENCOUNTER — Other Ambulatory Visit: Payer: Self-pay

## 2022-01-27 ENCOUNTER — Encounter: Payer: Self-pay | Admitting: Oncology

## 2022-01-27 NOTE — Progress Notes (Signed)
Spoke with patient, insurance denied her Reblozyl. She is going to come in tomorrow to sign the application for free medication through [employer].

## 2022-01-28 ENCOUNTER — Telehealth: Payer: Self-pay | Admitting: Cardiology

## 2022-01-28 NOTE — Telephone Encounter (Signed)
Patient calling the office for samples of medication:   1.  What medication and dosage are you requesting samples for? ELIQUIS 5 MG TABS tablet  2.  Are you currently out of this medication? She has enough to last until 09/01.   She says she has fallen into donut hole and would like to come by today for samples.

## 2022-01-28 NOTE — Progress Notes (Signed)
Sent in application to [employer] for free Reblozyl

## 2022-01-28 NOTE — Telephone Encounter (Signed)
Spoke with pt. Provided pt with samples of Eliquis '5mg'$  as ordered by Dr. Geraldo Pitter.

## 2022-01-29 ENCOUNTER — Inpatient Hospital Stay: Payer: Medicare Other

## 2022-01-29 VITALS — BP 148/61 | HR 77 | Temp 97.7°F | Resp 18 | Wt 151.0 lb

## 2022-01-29 DIAGNOSIS — D469 Myelodysplastic syndrome, unspecified: Secondary | ICD-10-CM

## 2022-01-29 DIAGNOSIS — D46Z Other myelodysplastic syndromes: Secondary | ICD-10-CM | POA: Diagnosis not present

## 2022-01-29 MED ORDER — EPOETIN ALFA-EPBX 40000 UNIT/ML IJ SOLN
40000.0000 [IU] | Freq: Once | INTRAMUSCULAR | Status: AC
  Administered 2022-01-29: 40000 [IU] via SUBCUTANEOUS
  Filled 2022-01-29: qty 1

## 2022-01-29 NOTE — Patient Instructions (Signed)

## 2022-01-30 ENCOUNTER — Encounter: Payer: Self-pay | Admitting: Oncology

## 2022-02-03 ENCOUNTER — Other Ambulatory Visit: Payer: Self-pay | Admitting: Pharmacist

## 2022-02-03 DIAGNOSIS — D643 Other sideroblastic anemias: Secondary | ICD-10-CM

## 2022-02-03 DIAGNOSIS — D469 Myelodysplastic syndrome, unspecified: Secondary | ICD-10-CM

## 2022-02-03 NOTE — Progress Notes (Signed)
Patient is receiving Assistance Medication - Supplied Externally. Medication: Reblozyl Manufacturer: [employer] Approval Dates: Approved from 01/30/2022 until 05/31/2022. ID: PAT - [id number] Reason: Insurance Denied First DOS: 02/12/2022

## 2022-02-04 ENCOUNTER — Encounter: Payer: Self-pay | Admitting: Oncology

## 2022-02-04 MED FILL — Luspatercept-aamt For Subcutaneous Inj 75 MG: SUBCUTANEOUS | Qty: 1.5 | Status: AC

## 2022-02-05 ENCOUNTER — Inpatient Hospital Stay: Payer: Medicare Other | Attending: Oncology

## 2022-02-05 VITALS — BP 154/59 | HR 60 | Temp 97.7°F | Resp 18 | Ht 63.0 in | Wt 149.2 lb

## 2022-02-05 DIAGNOSIS — D46Z Other myelodysplastic syndromes: Secondary | ICD-10-CM | POA: Diagnosis not present

## 2022-02-05 DIAGNOSIS — D643 Other sideroblastic anemias: Secondary | ICD-10-CM

## 2022-02-05 DIAGNOSIS — D469 Myelodysplastic syndrome, unspecified: Secondary | ICD-10-CM

## 2022-02-05 MED ORDER — LUSPATERCEPT-AAMT 75 MG ~~LOC~~ SOLR
1.1000 mg/kg | Freq: Once | SUBCUTANEOUS | Status: AC
  Administered 2022-02-05: 75 mg via SUBCUTANEOUS
  Filled 2022-02-05: qty 1.5

## 2022-02-05 NOTE — Patient Instructions (Signed)
Luspatercept Injection What is this medication? LUSPATERCEPT (lus PAT er sept) treats low levels of red blood cells (anemia) in the body in people with beta thalassemia or myelodysplastic syndromes. It works by helping the body make more red blood cells. This medicine may be used for other purposes; ask your health care provider or pharmacist if you have questions. COMMON BRAND NAME(S): REBLOZYL What should I tell my care team before I take this medication? They need to know if you have any of these conditions: Have had your spleen removed High blood pressure History of blood clots Tobacco use An unusual or allergic reaction to luspatercept, other medications, foods, dyes or preservatives Pregnant or trying to get pregnant Breast-feeding How should I use this medication? This medication is for injection under the skin. It is given by your care team in a hospital or clinic setting. Talk to your care team about the use of the medication in children. This medication is not approved for use in children. Overdosage: If you think you have taken too much of this medicine contact a poison control center or emergency room at once. NOTE: This medicine is only for you. Do not share this medicine with others. What if I miss a dose? Keep appointments for follow-up doses. It is important not to miss your dose. Call your care team if you are unable to keep an appointment. What may interact with this medication? Interactions are not expected. This list may not describe all possible interactions. Give your health care provider a list of all the medicines, herbs, non-prescription drugs, or dietary supplements you use. Also tell them if you smoke, drink alcohol, or use illegal drugs. Some items may interact with your medicine. What should I watch for while using this medication? Your condition will be monitored carefully while you are receiving this medication. Talk to your care team if you wish to become  pregnant or think you might be pregnant. This medication can cause serious birth defects. Discuss contraceptive options with your care team. Do not breastfeed while taking this medication. You may need blood work done while you are taking this medication. What side effects may I notice from receiving this medication? Side effects that you should report to your care team as soon as possible: Allergic reactions--skin rash, itching, hives, swelling of the face, lips, tongue, or throat Blood clot--pain, swelling, or warmth in the leg, shortness of breath, chest pain Increase in blood pressure Severe back pain, numbness or weakness of the hands, arms, legs, or feet, loss of coordination, loss of bowel or bladder control Side effects that usually do not require medical attention (report these to your care team if they continue or are bothersome): Bone pain Dizziness Fatigue Headache Joint pain Muscle pain Stomach pain This list may not describe all possible side effects. Call your doctor for medical advice about side effects. You may report side effects to FDA at 1-800-FDA-1088. Where should I keep my medication? This medication is given in a hospital or clinic. It will not be stored at home. NOTE: This sheet is a summary. It may not cover all possible information. If you have questions about this medicine, talk to your doctor, pharmacist, or health care provider.  2023 Elsevier/Gold Standard (2020-12-13 00:00:00)  

## 2022-02-12 ENCOUNTER — Ambulatory Visit: Payer: Medicare Other

## 2022-02-18 ENCOUNTER — Ambulatory Visit (INDEPENDENT_AMBULATORY_CARE_PROVIDER_SITE_OTHER): Payer: Medicare Other

## 2022-02-18 DIAGNOSIS — I495 Sick sinus syndrome: Secondary | ICD-10-CM | POA: Diagnosis not present

## 2022-02-18 LAB — CUP PACEART REMOTE DEVICE CHECK
Battery Remaining Longevity: 113 mo
Battery Remaining Percentage: 95 %
Battery Voltage: 3.02 V
Brady Statistic AP VP Percent: 1 %
Brady Statistic AP VS Percent: 25 %
Brady Statistic AS VP Percent: 1 %
Brady Statistic AS VS Percent: 74 %
Brady Statistic RA Percent Paced: 24 %
Brady Statistic RV Percent Paced: 1 %
Date Time Interrogation Session: 20230920030509
Implantable Lead Implant Date: 20220920
Implantable Lead Implant Date: 20220920
Implantable Lead Location: 753859
Implantable Lead Location: 753860
Implantable Pulse Generator Implant Date: 20220920
Lead Channel Impedance Value: 400 Ohm
Lead Channel Impedance Value: 460 Ohm
Lead Channel Pacing Threshold Amplitude: 0.5 V
Lead Channel Pacing Threshold Amplitude: 0.75 V
Lead Channel Pacing Threshold Pulse Width: 0.5 ms
Lead Channel Pacing Threshold Pulse Width: 0.5 ms
Lead Channel Sensing Intrinsic Amplitude: 12 mV
Lead Channel Sensing Intrinsic Amplitude: 2.6 mV
Lead Channel Setting Pacing Amplitude: 2 V
Lead Channel Setting Pacing Amplitude: 2.5 V
Lead Channel Setting Pacing Pulse Width: 0.5 ms
Lead Channel Setting Sensing Sensitivity: 2 mV
Pulse Gen Model: 2272
Pulse Gen Serial Number: 3963600

## 2022-02-19 ENCOUNTER — Ambulatory Visit: Payer: Medicare Other

## 2022-02-19 ENCOUNTER — Other Ambulatory Visit: Payer: Medicare Other

## 2022-02-25 ENCOUNTER — Other Ambulatory Visit: Payer: Self-pay | Admitting: Pharmacist

## 2022-02-25 DIAGNOSIS — D469 Myelodysplastic syndrome, unspecified: Secondary | ICD-10-CM

## 2022-02-25 DIAGNOSIS — D643 Other sideroblastic anemias: Secondary | ICD-10-CM

## 2022-02-25 MED FILL — Luspatercept-aamt For Subcutaneous Inj 75 MG: SUBCUTANEOUS | Qty: 1.5 | Status: AC

## 2022-02-26 ENCOUNTER — Inpatient Hospital Stay: Payer: Medicare Other

## 2022-02-26 ENCOUNTER — Ambulatory Visit: Payer: Medicare Other

## 2022-02-26 VITALS — BP 151/72 | HR 70 | Temp 97.5°F | Resp 18

## 2022-02-26 DIAGNOSIS — D469 Myelodysplastic syndrome, unspecified: Secondary | ICD-10-CM

## 2022-02-26 DIAGNOSIS — D643 Other sideroblastic anemias: Secondary | ICD-10-CM

## 2022-02-26 DIAGNOSIS — D72829 Elevated white blood cell count, unspecified: Secondary | ICD-10-CM | POA: Diagnosis not present

## 2022-02-26 DIAGNOSIS — D46Z Other myelodysplastic syndromes: Secondary | ICD-10-CM | POA: Diagnosis not present

## 2022-02-26 DIAGNOSIS — D649 Anemia, unspecified: Secondary | ICD-10-CM | POA: Diagnosis not present

## 2022-02-26 DIAGNOSIS — D472 Monoclonal gammopathy: Secondary | ICD-10-CM | POA: Diagnosis not present

## 2022-02-26 LAB — CBC AND DIFFERENTIAL
HCT: 34 — AB (ref 36–46)
Hemoglobin: 11 — AB (ref 12.0–16.0)
Neutrophils Absolute: 20.58
Platelets: 239 10*3/uL (ref 150–400)
WBC: 24.8

## 2022-02-26 LAB — CBC: RBC: 3.41 — AB (ref 3.87–5.11)

## 2022-02-26 MED ORDER — LUSPATERCEPT-AAMT 75 MG ~~LOC~~ SOLR
1.1000 mg/kg | Freq: Once | SUBCUTANEOUS | Status: AC
  Administered 2022-02-26: 75 mg via SUBCUTANEOUS
  Filled 2022-02-26: qty 1.5

## 2022-02-26 NOTE — Progress Notes (Signed)
Patient reports an increase in shortness of breath and fatigue- Patient has dyspnea after walking from waiting room to treatment room- Alexandra Kim Phy aware.

## 2022-02-26 NOTE — Patient Instructions (Signed)
Luspatercept Injection What is this medication? LUSPATERCEPT (lus PAT er sept) treats low levels of red blood cells (anemia) in the body in people with beta thalassemia or myelodysplastic syndromes. It works by helping the body make more red blood cells. This medicine may be used for other purposes; ask your health care provider or pharmacist if you have questions. COMMON BRAND NAME(S): REBLOZYL What should I tell my care team before I take this medication? They need to know if you have any of these conditions: Have had your spleen removed High blood pressure History of blood clots Tobacco use An unusual or allergic reaction to luspatercept, other medications, foods, dyes or preservatives Pregnant or trying to get pregnant Breast-feeding How should I use this medication? This medication is for injection under the skin. It is given by your care team in a hospital or clinic setting. Talk to your care team about the use of the medication in children. This medication is not approved for use in children. Overdosage: If you think you have taken too much of this medicine contact a poison control center or emergency room at once. NOTE: This medicine is only for you. Do not share this medicine with others. What if I miss a dose? Keep appointments for follow-up doses. It is important not to miss your dose. Call your care team if you are unable to keep an appointment. What may interact with this medication? Interactions are not expected. This list may not describe all possible interactions. Give your health care provider a list of all the medicines, herbs, non-prescription drugs, or dietary supplements you use. Also tell them if you smoke, drink alcohol, or use illegal drugs. Some items may interact with your medicine. What should I watch for while using this medication? Your condition will be monitored carefully while you are receiving this medication. Talk to your care team if you wish to become  pregnant or think you might be pregnant. This medication can cause serious birth defects. Discuss contraceptive options with your care team. Do not breastfeed while taking this medication. You may need blood work done while you are taking this medication. What side effects may I notice from receiving this medication? Side effects that you should report to your care team as soon as possible: Allergic reactions--skin rash, itching, hives, swelling of the face, lips, tongue, or throat Blood clot--pain, swelling, or warmth in the leg, shortness of breath, chest pain Increase in blood pressure Severe back pain, numbness or weakness of the hands, arms, legs, or feet, loss of coordination, loss of bowel or bladder control Side effects that usually do not require medical attention (report these to your care team if they continue or are bothersome): Bone pain Dizziness Fatigue Headache Joint pain Muscle pain Stomach pain This list may not describe all possible side effects. Call your doctor for medical advice about side effects. You may report side effects to FDA at 1-800-FDA-1088. Where should I keep my medication? This medication is given in a hospital or clinic. It will not be stored at home. NOTE: This sheet is a summary. It may not cover all possible information. If you have questions about this medicine, talk to your doctor, pharmacist, or health care provider.  2023 Elsevier/Gold Standard (2020-12-13 00:00:00)  

## 2022-03-03 ENCOUNTER — Other Ambulatory Visit: Payer: Medicare Other

## 2022-03-03 NOTE — Progress Notes (Signed)
Remote pacemaker transmission.   

## 2022-03-05 ENCOUNTER — Ambulatory Visit: Payer: Medicare Other

## 2022-03-12 ENCOUNTER — Ambulatory Visit: Payer: Medicare Other

## 2022-03-12 ENCOUNTER — Other Ambulatory Visit: Payer: Medicare Other

## 2022-03-18 NOTE — Progress Notes (Signed)
[facility]  [address] [place],  [place]  [address] [phone]  Clinic Day:  01/22/2022  Referring physician: [name], MD  HISTORY OF PRESENT ILLNESS:  The patient is an 87 y.o. 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  Her bone marrow biopsy revealed that over 15% of her erythroid precursors are ring sideroblasts, which highlights the myelodysplastic features of her bone marrow.  Furthermore, additional studies showed that she harbors the JAK2 mutation; she also has a high white count.  These findings highlight the myeloproliferative component of her bone marrow. She also had a positive M spike and an elevated IgM level, but there were no bone marrow findings to suggest  lymphoplasmacytic lymphoma was present.  She comes in today to reassess her peripheral counts.  Of note, since her last visit, her Luspatercept was approved to where she is now receiving this injection every 3 weeks to improve her anemia.  She currently takes hydroxyurea 500 mg every Monday, Wednesday, and Friday, with the goal being to keep her white blood cell count less than 20.  Since her last visit, the patient has been doing okay.  She still has bouts of fatigue to where she occasionally has to hold onto objects to stabilize her balance.  PHYSICAL EXAM:  Blood pressure (!) 149/70, pulse 81, temperature (!) 97.4 F (36.3 C), resp. rate 16, height 5' 3"  (1.6 m), weight 146 lb 4.8 oz (66.4 kg), SpO2 95 %. Wt Readings from Last 3 Encounters:  03/19/22 146 lb 4.8 oz (66.4 kg)  02/05/22 149 lb 4 oz (67.7 kg)  01/29/22 151 lb 0.2 oz (68.5 kg)   Body mass index is 25.92 kg/m. Performance status (ECOG): 1 - Symptomatic but completely ambulatory Physical Exam Constitutional:      Appearance: Normal appearance. She is not ill-appearing.  HENT:     Mouth/Throat:     Mouth: Mucous membranes are moist.      Pharynx: Oropharynx is clear. No oropharyngeal exudate or posterior oropharyngeal erythema.  Cardiovascular:     Rate and Rhythm: Normal rate and regular rhythm.     Heart sounds: No murmur heard.    No friction rub. No gallop.  Pulmonary:     Effort: Pulmonary effort is normal. No respiratory distress.     Breath sounds: Normal breath sounds. No wheezing, rhonchi or rales.  Abdominal:     General: Bowel sounds are normal. There is no distension.     Palpations: Abdomen is soft. There is no mass.     Tenderness: There is no abdominal tenderness.  Musculoskeletal:        General: No swelling.     Right lower leg: No edema.     Left lower leg: No edema.  Lymphadenopathy:     Cervical: No cervical adenopathy.     Upper Body:     Right upper body: No supraclavicular or axillary adenopathy.     Left upper body: No supraclavicular or axillary adenopathy.     Lower Body: No right inguinal adenopathy. No left inguinal adenopathy.  Skin:    General: Skin is warm.     Coloration: Skin is not jaundiced.     Findings: No lesion or rash.  Neurological:     General: No focal deficit present.     Mental Status: She is alert and oriented to person, place, and time. Mental status is at baseline.  Psychiatric:  Mood and Affect: Mood normal.        Behavior: Behavior normal.        Thought Content: Thought content normal.    LABS:     ASSESSMENT & PLAN:  An 87 y.o. female who has myelodysplastic/myeloproliferative disease.  When evaluating her labs today, I am concerned with her rising white count.  Based upon this, I told the patient to increase her hydroxyurea to 500 mg daily.  However, I am pleased with the improvement in her hemoglobin, which reflects the efficacy of Luspatercept.  She will continue to receive this injection every 3 weeks.  Her CBC will continue to be checked every 3 weeks to see how well she is responding to the aforementioned interventions.  I will see her back in 9  weeks for repeat clinical assessment.  The patient understands all the plans discussed today and is in agreement with them.   Alexandra Galluzzo Macarthur Critchley, MD

## 2022-03-19 ENCOUNTER — Ambulatory Visit: Payer: Medicare Other

## 2022-03-19 ENCOUNTER — Inpatient Hospital Stay: Payer: Medicare Other | Attending: Oncology | Admitting: Oncology

## 2022-03-19 ENCOUNTER — Other Ambulatory Visit: Payer: Self-pay | Admitting: Oncology

## 2022-03-19 ENCOUNTER — Inpatient Hospital Stay: Payer: Medicare Other

## 2022-03-19 ENCOUNTER — Other Ambulatory Visit: Payer: Self-pay | Admitting: Pharmacist

## 2022-03-19 VITALS — BP 149/70 | HR 81 | Temp 97.4°F | Resp 16 | Ht 63.0 in | Wt 146.3 lb

## 2022-03-19 DIAGNOSIS — D643 Other sideroblastic anemias: Secondary | ICD-10-CM

## 2022-03-19 DIAGNOSIS — D469 Myelodysplastic syndrome, unspecified: Secondary | ICD-10-CM

## 2022-03-19 DIAGNOSIS — D46Z Other myelodysplastic syndromes: Secondary | ICD-10-CM | POA: Insufficient documentation

## 2022-03-19 LAB — CBC AND DIFFERENTIAL
HCT: 33 — AB (ref 36–46)
Hemoglobin: 10.6 — AB (ref 12.0–16.0)
Neutrophils Absolute: 23.41
Platelets: 256 10*3/uL (ref 150–400)
WBC: 28.2

## 2022-03-19 LAB — CBC: RBC: 3.28 — AB (ref 3.87–5.11)

## 2022-03-19 MED ORDER — HYDROXYUREA 500 MG PO CAPS: ORAL_CAPSULE | ORAL | 3 refills | Status: DC

## 2022-03-19 MED FILL — Luspatercept-aamt For Subcutaneous Inj 75 MG: SUBCUTANEOUS | Qty: 1.5 | Status: AC

## 2022-03-20 ENCOUNTER — Inpatient Hospital Stay: Payer: Medicare Other

## 2022-03-20 ENCOUNTER — Ambulatory Visit: Payer: Medicare Other

## 2022-03-20 VITALS — BP 146/79 | HR 67 | Temp 97.5°F | Resp 18 | Wt 148.0 lb

## 2022-03-20 DIAGNOSIS — D643 Other sideroblastic anemias: Secondary | ICD-10-CM

## 2022-03-20 DIAGNOSIS — D46Z Other myelodysplastic syndromes: Secondary | ICD-10-CM | POA: Diagnosis not present

## 2022-03-20 DIAGNOSIS — D469 Myelodysplastic syndrome, unspecified: Secondary | ICD-10-CM

## 2022-03-20 MED ORDER — LUSPATERCEPT-AAMT 75 MG ~~LOC~~ SOLR
1.1000 mg/kg | Freq: Once | SUBCUTANEOUS | Status: AC
  Administered 2022-03-20: 75 mg via SUBCUTANEOUS
  Filled 2022-03-20: qty 1.5

## 2022-03-20 NOTE — Patient Instructions (Signed)
Luspatercept Injection What is this medication? LUSPATERCEPT (lus PAT er sept) treats low levels of red blood cells (anemia) in the body in people with beta thalassemia or myelodysplastic syndromes. It works by helping the body make more red blood cells. This medicine may be used for other purposes; ask your health care provider or pharmacist if you have questions. COMMON BRAND NAME(S): REBLOZYL What should I tell my care team before I take this medication? They need to know if you have any of these conditions: Have had your spleen removed High blood pressure History of blood clots Tobacco use An unusual or allergic reaction to luspatercept, other medications, foods, dyes or preservatives Pregnant or trying to get pregnant Breast-feeding How should I use this medication? This medication is for injection under the skin. It is given by your care team in a hospital or clinic setting. Talk to your care team about the use of the medication in children. This medication is not approved for use in children. Overdosage: If you think you have taken too much of this medicine contact a poison control center or emergency room at once. NOTE: This medicine is only for you. Do not share this medicine with others. What if I miss a dose? Keep appointments for follow-up doses. It is important not to miss your dose. Call your care team if you are unable to keep an appointment. What may interact with this medication? Interactions are not expected. This list may not describe all possible interactions. Give your health care provider a list of all the medicines, herbs, non-prescription drugs, or dietary supplements you use. Also tell them if you smoke, drink alcohol, or use illegal drugs. Some items may interact with your medicine. What should I watch for while using this medication? Your condition will be monitored carefully while you are receiving this medication. Talk to your care team if you wish to become  pregnant or think you might be pregnant. This medication can cause serious birth defects. Discuss contraceptive options with your care team. Do not breastfeed while taking this medication. You may need blood work done while you are taking this medication. What side effects may I notice from receiving this medication? Side effects that you should report to your care team as soon as possible: Allergic reactions--skin rash, itching, hives, swelling of the face, lips, tongue, or throat Blood clot--pain, swelling, or warmth in the leg, shortness of breath, chest pain Increase in blood pressure Severe back pain, numbness or weakness of the hands, arms, legs, or feet, loss of coordination, loss of bowel or bladder control Side effects that usually do not require medical attention (report these to your care team if they continue or are bothersome): Bone pain Dizziness Fatigue Headache Joint pain Muscle pain Stomach pain This list may not describe all possible side effects. Call your doctor for medical advice about side effects. You may report side effects to FDA at 1-800-FDA-1088. Where should I keep my medication? This medication is given in a hospital or clinic. It will not be stored at home. NOTE: This sheet is a summary. It may not cover all possible information. If you have questions about this medicine, talk to your doctor, pharmacist, or health care provider.  2023 Elsevier/Gold Standard (2020-12-13 00:00:00)  

## 2022-03-21 ENCOUNTER — Other Ambulatory Visit: Payer: Self-pay

## 2022-03-26 ENCOUNTER — Ambulatory Visit: Payer: Medicare Other

## 2022-03-30 DIAGNOSIS — I480 Paroxysmal atrial fibrillation: Secondary | ICD-10-CM | POA: Diagnosis not present

## 2022-03-30 DIAGNOSIS — I739 Peripheral vascular disease, unspecified: Secondary | ICD-10-CM | POA: Diagnosis not present

## 2022-03-30 DIAGNOSIS — D469 Myelodysplastic syndrome, unspecified: Secondary | ICD-10-CM | POA: Diagnosis not present

## 2022-03-30 DIAGNOSIS — E785 Hyperlipidemia, unspecified: Secondary | ICD-10-CM | POA: Diagnosis not present

## 2022-03-30 DIAGNOSIS — I10 Essential (primary) hypertension: Secondary | ICD-10-CM | POA: Diagnosis not present

## 2022-03-30 DIAGNOSIS — I700 Atherosclerosis of aorta: Secondary | ICD-10-CM | POA: Diagnosis not present

## 2022-04-09 ENCOUNTER — Other Ambulatory Visit: Payer: Medicare Other

## 2022-04-09 ENCOUNTER — Inpatient Hospital Stay: Payer: Medicare Other | Attending: Oncology

## 2022-04-09 DIAGNOSIS — D469 Myelodysplastic syndrome, unspecified: Secondary | ICD-10-CM

## 2022-04-09 DIAGNOSIS — D46Z Other myelodysplastic syndromes: Secondary | ICD-10-CM | POA: Diagnosis not present

## 2022-04-09 DIAGNOSIS — D643 Other sideroblastic anemias: Secondary | ICD-10-CM

## 2022-04-09 LAB — CBC WITH DIFFERENTIAL (CANCER CENTER ONLY)
Abs Immature Granulocytes: 1.3 10*3/uL — ABNORMAL HIGH (ref 0.00–0.07)
Basophils Absolute: 0.4 10*3/uL — ABNORMAL HIGH (ref 0.0–0.1)
Basophils Relative: 2 %
Eosinophils Absolute: 0.3 10*3/uL (ref 0.0–0.5)
Eosinophils Relative: 1 %
HCT: 29.9 % — ABNORMAL LOW (ref 36.0–46.0)
Hemoglobin: 9.3 g/dL — ABNORMAL LOW (ref 12.0–15.0)
Immature Granulocytes: 6 %
Lymphocytes Relative: 10 %
Lymphs Abs: 2.4 10*3/uL (ref 0.7–4.0)
MCH: 33.3 pg (ref 26.0–34.0)
MCHC: 31.1 g/dL (ref 30.0–36.0)
MCV: 107.2 fL — ABNORMAL HIGH (ref 80.0–100.0)
Monocytes Absolute: 1.3 10*3/uL — ABNORMAL HIGH (ref 0.1–1.0)
Monocytes Relative: 5 %
Neutro Abs: 18 10*3/uL — ABNORMAL HIGH (ref 1.7–7.7)
Neutrophils Relative %: 76 %
Platelet Count: 258 10*3/uL (ref 150–400)
RBC: 2.79 MIL/uL — ABNORMAL LOW (ref 3.87–5.11)
RDW: 25.6 % — ABNORMAL HIGH (ref 11.5–15.5)
WBC Count: 23.7 10*3/uL — ABNORMAL HIGH (ref 4.0–10.5)
nRBC: 1.1 % — ABNORMAL HIGH (ref 0.0–0.2)

## 2022-04-09 LAB — SAMPLE TO BLOOD BANK

## 2022-04-09 MED FILL — Luspatercept-aamt For Subcutaneous Inj 75 MG: SUBCUTANEOUS | Qty: 1.5 | Status: AC

## 2022-04-10 ENCOUNTER — Inpatient Hospital Stay: Payer: Medicare Other

## 2022-04-10 VITALS — BP 126/58 | HR 77 | Temp 97.5°F | Resp 18 | Ht 63.0 in | Wt 148.2 lb

## 2022-04-10 DIAGNOSIS — D46Z Other myelodysplastic syndromes: Secondary | ICD-10-CM | POA: Diagnosis not present

## 2022-04-10 DIAGNOSIS — D643 Other sideroblastic anemias: Secondary | ICD-10-CM

## 2022-04-10 DIAGNOSIS — D469 Myelodysplastic syndrome, unspecified: Secondary | ICD-10-CM

## 2022-04-10 MED ORDER — LUSPATERCEPT-AAMT 75 MG ~~LOC~~ SOLR
1.1000 mg/kg | Freq: Once | SUBCUTANEOUS | Status: AC
  Administered 2022-04-10: 75 mg via SUBCUTANEOUS
  Filled 2022-04-10: qty 1.5

## 2022-04-10 NOTE — Patient Instructions (Signed)
Luspatercept Injection What is this medication? LUSPATERCEPT (lus PAT er sept) treats low levels of red blood cells (anemia) in the body in people with beta thalassemia or myelodysplastic syndromes. It works by helping the body make more red blood cells. This medicine may be used for other purposes; ask your health care provider or pharmacist if you have questions. COMMON BRAND NAME(S): REBLOZYL What should I tell my care team before I take this medication? They need to know if you have any of these conditions: Have had your spleen removed High blood pressure History of blood clots Tobacco use An unusual or allergic reaction to luspatercept, other medications, foods, dyes or preservatives Pregnant or trying to get pregnant Breast-feeding How should I use this medication? This medication is for injection under the skin. It is given by your care team in a hospital or clinic setting. Talk to your care team about the use of the medication in children. This medication is not approved for use in children. Overdosage: If you think you have taken too much of this medicine contact a poison control center or emergency room at once. NOTE: This medicine is only for you. Do not share this medicine with others. What if I miss a dose? Keep appointments for follow-up doses. It is important not to miss your dose. Call your care team if you are unable to keep an appointment. What may interact with this medication? Interactions are not expected. This list may not describe all possible interactions. Give your health care provider a list of all the medicines, herbs, non-prescription drugs, or dietary supplements you use. Also tell them if you smoke, drink alcohol, or use illegal drugs. Some items may interact with your medicine. What should I watch for while using this medication? Your condition will be monitored carefully while you are receiving this medication. Talk to your care team if you wish to become  pregnant or think you might be pregnant. This medication can cause serious birth defects. Discuss contraceptive options with your care team. Do not breastfeed while taking this medication. You may need blood work done while you are taking this medication. What side effects may I notice from receiving this medication? Side effects that you should report to your care team as soon as possible: Allergic reactions--skin rash, itching, hives, swelling of the face, lips, tongue, or throat Blood clot--pain, swelling, or warmth in the leg, shortness of breath, chest pain Increase in blood pressure Severe back pain, numbness or weakness of the hands, arms, legs, or feet, loss of coordination, loss of bowel or bladder control Side effects that usually do not require medical attention (report these to your care team if they continue or are bothersome): Bone pain Dizziness Fatigue Headache Joint pain Muscle pain Stomach pain This list may not describe all possible side effects. Call your doctor for medical advice about side effects. You may report side effects to FDA at 1-800-FDA-1088. Where should I keep my medication? This medication is given in a hospital or clinic. It will not be stored at home. NOTE: This sheet is a summary. It may not cover all possible information. If you have questions about this medicine, talk to your doctor, pharmacist, or health care provider.  2023 Elsevier/Gold Standard (2020-12-13 00:00:00)  

## 2022-04-20 ENCOUNTER — Telehealth: Payer: Self-pay | Admitting: Cardiology

## 2022-04-20 NOTE — Telephone Encounter (Signed)
Advised that once she brings the paperwork to the office we will complete our part and get it sent in. Pt verbalized understanding and had no additional questions.

## 2022-04-20 NOTE — Telephone Encounter (Signed)
Patient called to follow-up on her renewal application with [employer].

## 2022-04-29 ENCOUNTER — Inpatient Hospital Stay: Payer: Medicare Other

## 2022-04-29 DIAGNOSIS — D643 Other sideroblastic anemias: Secondary | ICD-10-CM | POA: Diagnosis not present

## 2022-04-29 DIAGNOSIS — D469 Myelodysplastic syndrome, unspecified: Secondary | ICD-10-CM

## 2022-04-29 LAB — CBC AND DIFFERENTIAL
HCT: 30 — AB (ref 36–46)
Hemoglobin: 9.8 — AB (ref 12.0–16.0)
Neutrophils Absolute: 16.71
Platelets: 285 10*3/uL (ref 150–400)
WBC: 21.7

## 2022-04-29 LAB — CBC: RBC: 2.84 — AB (ref 3.87–5.11)

## 2022-04-30 ENCOUNTER — Other Ambulatory Visit: Payer: Medicare Other

## 2022-04-30 MED FILL — Luspatercept-aamt For Subcutaneous Inj 75 MG: SUBCUTANEOUS | Qty: 1.5 | Status: AC

## 2022-05-01 ENCOUNTER — Inpatient Hospital Stay: Payer: Medicare Other | Attending: Oncology

## 2022-05-01 VITALS — BP 143/72 | HR 77 | Temp 97.7°F | Resp 16 | Ht 63.0 in | Wt 150.1 lb

## 2022-05-01 DIAGNOSIS — Z79899 Other long term (current) drug therapy: Secondary | ICD-10-CM | POA: Insufficient documentation

## 2022-05-01 DIAGNOSIS — D46Z Other myelodysplastic syndromes: Secondary | ICD-10-CM | POA: Insufficient documentation

## 2022-05-01 DIAGNOSIS — D469 Myelodysplastic syndrome, unspecified: Secondary | ICD-10-CM

## 2022-05-01 DIAGNOSIS — D643 Other sideroblastic anemias: Secondary | ICD-10-CM

## 2022-05-01 MED ORDER — LUSPATERCEPT-AAMT 75 MG ~~LOC~~ SOLR
1.1000 mg/kg | Freq: Once | SUBCUTANEOUS | Status: AC
  Administered 2022-05-01: 75 mg via SUBCUTANEOUS
  Filled 2022-05-01: qty 1.5

## 2022-05-01 NOTE — Patient Instructions (Signed)
Luspatercept Injection What is this medication? LUSPATERCEPT (lus PAT er sept) treats low levels of red blood cells (anemia) in the body in people with beta thalassemia or myelodysplastic syndromes. It works by helping the body make more red blood cells. This medicine may be used for other purposes; ask your health care provider or pharmacist if you have questions. COMMON BRAND NAME(S): REBLOZYL What should I tell my care team before I take this medication? They need to know if you have any of these conditions: Have had your spleen removed High blood pressure History of blood clots Tobacco use An unusual or allergic reaction to luspatercept, other medications, foods, dyes or preservatives Pregnant or trying to get pregnant Breast-feeding How should I use this medication? This medication is for injection under the skin. It is given by your care team in a hospital or clinic setting. Talk to your care team about the use of the medication in children. This medication is not approved for use in children. Overdosage: If you think you have taken too much of this medicine contact a poison control center or emergency room at once. NOTE: This medicine is only for you. Do not share this medicine with others. What if I miss a dose? Keep appointments for follow-up doses. It is important not to miss your dose. Call your care team if you are unable to keep an appointment. What may interact with this medication? Interactions are not expected. This list may not describe all possible interactions. Give your health care provider a list of all the medicines, herbs, non-prescription drugs, or dietary supplements you use. Also tell them if you smoke, drink alcohol, or use illegal drugs. Some items may interact with your medicine. What should I watch for while using this medication? Your condition will be monitored carefully while you are receiving this medication. Talk to your care team if you wish to become  pregnant or think you might be pregnant. This medication can cause serious birth defects. Discuss contraceptive options with your care team. Do not breastfeed while taking this medication. You may need blood work done while you are taking this medication. What side effects may I notice from receiving this medication? Side effects that you should report to your care team as soon as possible: Allergic reactions--skin rash, itching, hives, swelling of the face, lips, tongue, or throat Blood clot--pain, swelling, or warmth in the leg, shortness of breath, chest pain Increase in blood pressure Severe back pain, numbness or weakness of the hands, arms, legs, or feet, loss of coordination, loss of bowel or bladder control Side effects that usually do not require medical attention (report these to your care team if they continue or are bothersome): Bone pain Dizziness Fatigue Headache Joint pain Muscle pain Stomach pain This list may not describe all possible side effects. Call your doctor for medical advice about side effects. You may report side effects to FDA at 1-800-FDA-1088. Where should I keep my medication? This medication is given in a hospital or clinic. It will not be stored at home. NOTE: This sheet is a summary. It may not cover all possible information. If you have questions about this medicine, talk to your doctor, pharmacist, or health care provider.  2023 Elsevier/Gold Standard (2020-12-13 00:00:00)  

## 2022-05-07 ENCOUNTER — Encounter: Payer: Self-pay | Admitting: Oncology

## 2022-05-20 ENCOUNTER — Telehealth: Payer: Self-pay | Admitting: Oncology

## 2022-05-20 ENCOUNTER — Ambulatory Visit (INDEPENDENT_AMBULATORY_CARE_PROVIDER_SITE_OTHER): Payer: Medicare Other

## 2022-05-20 ENCOUNTER — Inpatient Hospital Stay: Payer: Medicare Other | Admitting: Oncology

## 2022-05-20 ENCOUNTER — Inpatient Hospital Stay: Payer: Medicare Other

## 2022-05-20 DIAGNOSIS — D643 Other sideroblastic anemias: Secondary | ICD-10-CM

## 2022-05-20 DIAGNOSIS — D469 Myelodysplastic syndrome, unspecified: Secondary | ICD-10-CM | POA: Diagnosis not present

## 2022-05-20 DIAGNOSIS — I495 Sick sinus syndrome: Secondary | ICD-10-CM | POA: Diagnosis not present

## 2022-05-20 LAB — CUP PACEART REMOTE DEVICE CHECK
Battery Remaining Longevity: 111 mo
Battery Remaining Percentage: 93 %
Battery Voltage: 3.02 V
Brady Statistic AP VP Percent: 1 %
Brady Statistic AP VS Percent: 25 %
Brady Statistic AS VP Percent: 1 %
Brady Statistic AS VS Percent: 74 %
Brady Statistic RA Percent Paced: 25 %
Brady Statistic RV Percent Paced: 1 %
Date Time Interrogation Session: 20231220040031
Implantable Lead Connection Status: 753985
Implantable Lead Connection Status: 753985
Implantable Lead Implant Date: 20220920
Implantable Lead Implant Date: 20220920
Implantable Lead Location: 753859
Implantable Lead Location: 753860
Implantable Pulse Generator Implant Date: 20220920
Lead Channel Impedance Value: 390 Ohm
Lead Channel Impedance Value: 460 Ohm
Lead Channel Pacing Threshold Amplitude: 0.5 V
Lead Channel Pacing Threshold Amplitude: 0.75 V
Lead Channel Pacing Threshold Pulse Width: 0.5 ms
Lead Channel Pacing Threshold Pulse Width: 0.5 ms
Lead Channel Sensing Intrinsic Amplitude: 11.2 mV
Lead Channel Sensing Intrinsic Amplitude: 3.2 mV
Lead Channel Setting Pacing Amplitude: 2 V
Lead Channel Setting Pacing Amplitude: 2.5 V
Lead Channel Setting Pacing Pulse Width: 0.5 ms
Lead Channel Setting Sensing Sensitivity: 2 mV
Pulse Gen Model: 2272
Pulse Gen Serial Number: 3963600

## 2022-05-20 LAB — CBC AND DIFFERENTIAL
HCT: 31 — AB (ref 36–46)
Hemoglobin: 10.2 — AB (ref 12.0–16.0)
Neutrophils Absolute: 18.68
Platelets: 295 10*3/uL (ref 150–400)
WBC: 22.5

## 2022-05-20 LAB — CBC: RBC: 2.96 — AB (ref 3.87–5.11)

## 2022-05-20 NOTE — Telephone Encounter (Signed)
Patient has been scheduled for follow-up visit per 05/20/22 los. Pt given an appt calendar with date and time.  

## 2022-05-20 NOTE — Progress Notes (Signed)
[facility]  [address] [place],  [place]  [address] [phone]  Clinic Day:  05/20/2022  Referring physician: [name], MD  HISTORY OF PRESENT ILLNESS:  The patient is an 87 y.o. 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  Her bone marrow biopsy revealed that over 15% of her erythroid precursors are ring sideroblasts, which highlights the myelodysplastic features of her bone marrow.  Furthermore, additional studies showed that she harbors the JAK2 mutation; she also has a high white count.  These findings highlight the myeloproliferative component of her bone marrow. She also had a positive M spike and an elevated IgM level, but there were no bone marrow findings to suggest lymphoplasmacytic lymphoma was present.  She comes in today to reassess her peripheral counts.  She currently takes Luspatercept every 3 weeks to improve her anemia.  She currently takes hydroxyurea 500 mg daily, with the goal being to keep her white blood cell count less than 20.  Since her last visit, the patient has been doing okay.  She still has bouts of fatigue, but denies there being a decline in her health.    PHYSICAL EXAM:  Blood pressure (!) 183/94, pulse 65, temperature (!) 97.5 F (36.4 C), resp. rate 18, height _0  (1.6 m), weight 146 lb 6.4 oz (66.4 kg), SpO2 99 %. Wt Readings from Last 3 Encounters:  05/22/22 144 lb (65.3 kg)  05/20/22 146 lb 6.4 oz (66.4 kg)  05/01/22 150 lb 1.9 oz (68.1 kg)   Body mass index is 25.93 kg/m. Performance status (ECOG): 1 - Symptomatic but completely ambulatory Physical Exam Constitutional:      Appearance: Normal appearance. She is not ill-appearing.  HENT:     Mouth/Throat:     Mouth: Mucous membranes are moist.     Pharynx: Oropharynx is clear. No oropharyngeal exudate or posterior oropharyngeal erythema.  Cardiovascular:     Rate and Rhythm:  Normal rate and regular rhythm.     Heart sounds: No murmur heard.    No friction rub. No gallop.  Pulmonary:     Effort: Pulmonary effort is normal. No respiratory distress.     Breath sounds: Normal breath sounds. No wheezing, rhonchi or rales.  Abdominal:     General: Bowel sounds are normal. There is no distension.     Palpations: Abdomen is soft. There is no mass.     Tenderness: There is no abdominal tenderness.  Musculoskeletal:        General: No swelling.     Right lower leg: No edema.     Left lower leg: No edema.  Lymphadenopathy:     Cervical: No cervical adenopathy.     Upper Body:     Right upper body: No supraclavicular or axillary adenopathy.     Left upper body: No supraclavicular or axillary adenopathy.     Lower Body: No right inguinal adenopathy. No left inguinal adenopathy.  Skin:    General: Skin is warm.     Coloration: Skin is not jaundiced.     Findings: No lesion or rash.  Neurological:     General: No focal deficit present.     Mental Status: She is alert and oriented to person, place, and time. Mental status is at baseline.  Psychiatric:        Mood and Affect: Mood normal.        Behavior: Behavior normal.  Thought Content: Thought content normal.    LABS:    Latest Reference Range & Units 05/20/22 00:00  WBC  22.5 (E)  RBC 3.87 - 5.11  2.96 ! (E)  Hemoglobin 12.0 - 16.0  10.2 ! (E)  HCT 36 - 46  31 ! (E)  Platelets 150 - 400 K/uL 295 (E)  NEUT#  18.68 (E)  !: Data is abnormal (E): External lab result   ASSESSMENT & PLAN:  An 87 y.o. female who has myelodysplastic/myeloproliferative disease.  When evaluating her labs today, her white count remains slightly above 20.  For now, she will continue to take hydroxyurea 500 mg daily.  I am pleased with the steady improvement in her hemoglobin, which reflects the efficacy of Luspatercept.  She will continue to receive Luspatercept every 3 weeks.  Her CBC will continue to be checked every 3  weeks to see how well she is responding to her therapies.  I will see her back in 12 weeks for repeat clinical assessment.  The patient understands all the plans discussed today and is in agreement with them.   Dain Laseter Macarthur Critchley, MD

## 2022-05-21 ENCOUNTER — Ambulatory Visit: Payer: Medicare Other | Admitting: Oncology

## 2022-05-21 ENCOUNTER — Other Ambulatory Visit: Payer: Medicare Other

## 2022-05-21 MED FILL — Luspatercept-aamt For Subcutaneous Inj 75 MG: SUBCUTANEOUS | Qty: 1.5 | Status: AC

## 2022-05-22 ENCOUNTER — Inpatient Hospital Stay: Payer: Medicare Other

## 2022-05-22 VITALS — BP 137/111 | HR 73 | Temp 97.5°F | Resp 16 | Ht 63.0 in | Wt 144.0 lb

## 2022-05-22 DIAGNOSIS — D46Z Other myelodysplastic syndromes: Secondary | ICD-10-CM | POA: Diagnosis not present

## 2022-05-22 DIAGNOSIS — D469 Myelodysplastic syndrome, unspecified: Secondary | ICD-10-CM

## 2022-05-22 DIAGNOSIS — Z79899 Other long term (current) drug therapy: Secondary | ICD-10-CM | POA: Diagnosis not present

## 2022-05-22 DIAGNOSIS — D643 Other sideroblastic anemias: Secondary | ICD-10-CM

## 2022-05-22 MED ORDER — LUSPATERCEPT-AAMT 75 MG ~~LOC~~ SOLR
1.1000 mg/kg | Freq: Once | SUBCUTANEOUS | Status: AC
  Administered 2022-05-22: 75 mg via SUBCUTANEOUS
  Filled 2022-05-22: qty 1.5

## 2022-05-22 NOTE — Patient Instructions (Signed)
Luspatercept Injection What is this medication? LUSPATERCEPT (lus PAT er sept) treats low levels of red blood cells (anemia) in the body in people with beta thalassemia or myelodysplastic syndromes. It works by helping the body make more red blood cells. This medicine may be used for other purposes; ask your health care provider or pharmacist if you have questions. COMMON BRAND NAME(S): REBLOZYL What should I tell my care team before I take this medication? They need to know if you have any of these conditions: Have had your spleen removed High blood pressure History of blood clots Tobacco use An unusual or allergic reaction to luspatercept, other medications, foods, dyes or preservatives Pregnant or trying to get pregnant Breast-feeding How should I use this medication? This medication is for injection under the skin. It is given by your care team in a hospital or clinic setting. Talk to your care team about the use of the medication in children. This medication is not approved for use in children. Overdosage: If you think you have taken too much of this medicine contact a poison control center or emergency room at once. NOTE: This medicine is only for you. Do not share this medicine with others. What if I miss a dose? Keep appointments for follow-up doses. It is important not to miss your dose. Call your care team if you are unable to keep an appointment. What may interact with this medication? Interactions are not expected. This list may not describe all possible interactions. Give your health care provider a list of all the medicines, herbs, non-prescription drugs, or dietary supplements you use. Also tell them if you smoke, drink alcohol, or use illegal drugs. Some items may interact with your medicine. What should I watch for while using this medication? Your condition will be monitored carefully while you are receiving this medication. Talk to your care team if you wish to become  pregnant or think you might be pregnant. This medication can cause serious birth defects. Discuss contraceptive options with your care team. Do not breastfeed while taking this medication. You may need blood work done while you are taking this medication. What side effects may I notice from receiving this medication? Side effects that you should report to your care team as soon as possible: Allergic reactions--skin rash, itching, hives, swelling of the face, lips, tongue, or throat Blood clot--pain, swelling, or warmth in the leg, shortness of breath, chest pain Increase in blood pressure Severe back pain, numbness or weakness of the hands, arms, legs, or feet, loss of coordination, loss of bowel or bladder control Side effects that usually do not require medical attention (report these to your care team if they continue or are bothersome): Bone pain Dizziness Fatigue Headache Joint pain Muscle pain Stomach pain This list may not describe all possible side effects. Call your doctor for medical advice about side effects. You may report side effects to FDA at 1-800-FDA-1088. Where should I keep my medication? This medication is given in a hospital or clinic. It will not be stored at home. NOTE: This sheet is a summary. It may not cover all possible information. If you have questions about this medicine, talk to your doctor, pharmacist, or health care provider.  2023 Elsevier/Gold Standard (2020-12-13 00:00:00)  

## 2022-05-28 ENCOUNTER — Telehealth: Payer: Self-pay | Admitting: Cardiology

## 2022-05-28 NOTE — Telephone Encounter (Signed)
Attempted to call the device clinic, but it says that the number is no longer a working number.  Pt would like a call back to discuss her device and the results it has given.

## 2022-05-29 NOTE — Telephone Encounter (Signed)
Called patient back, patient wanted to know how much her pacemaker was working informed her that her pacemaker was functioning about 26% of the time, patient also wanted to know why someone couldn't call her when ever there was report informed patient that we had over 3,000 device patients and that it was not feasible to call every patient for every report and we call only if something was abnormal patient voiced understanding

## 2022-06-01 ENCOUNTER — Encounter: Payer: Self-pay | Admitting: Oncology

## 2022-06-08 ENCOUNTER — Inpatient Hospital Stay: Payer: Medicare Other | Attending: Oncology

## 2022-06-08 DIAGNOSIS — Z79899 Other long term (current) drug therapy: Secondary | ICD-10-CM | POA: Diagnosis not present

## 2022-06-08 DIAGNOSIS — D46Z Other myelodysplastic syndromes: Secondary | ICD-10-CM | POA: Insufficient documentation

## 2022-06-08 DIAGNOSIS — D469 Myelodysplastic syndrome, unspecified: Secondary | ICD-10-CM

## 2022-06-08 DIAGNOSIS — D643 Other sideroblastic anemias: Secondary | ICD-10-CM

## 2022-06-08 LAB — CBC WITH DIFFERENTIAL (CANCER CENTER ONLY)
Abs Immature Granulocytes: 1.24 10*3/uL — ABNORMAL HIGH (ref 0.00–0.07)
Basophils Absolute: 0.4 10*3/uL — ABNORMAL HIGH (ref 0.0–0.1)
Basophils Relative: 2 %
Eosinophils Absolute: 0.2 10*3/uL (ref 0.0–0.5)
Eosinophils Relative: 1 %
HCT: 30.1 % — ABNORMAL LOW (ref 36.0–46.0)
Hemoglobin: 9.2 g/dL — ABNORMAL LOW (ref 12.0–15.0)
Immature Granulocytes: 6 %
Lymphocytes Relative: 8 %
Lymphs Abs: 1.8 10*3/uL (ref 0.7–4.0)
MCH: 34.1 pg — ABNORMAL HIGH (ref 26.0–34.0)
MCHC: 30.6 g/dL (ref 30.0–36.0)
MCV: 111.5 fL — ABNORMAL HIGH (ref 80.0–100.0)
Monocytes Absolute: 1.2 10*3/uL — ABNORMAL HIGH (ref 0.1–1.0)
Monocytes Relative: 6 %
Neutro Abs: 17.5 10*3/uL — ABNORMAL HIGH (ref 1.7–7.7)
Neutrophils Relative %: 77 %
Platelet Count: 255 10*3/uL (ref 150–400)
RBC: 2.7 MIL/uL — ABNORMAL LOW (ref 3.87–5.11)
RDW: 25.2 % — ABNORMAL HIGH (ref 11.5–15.5)
WBC Count: 22.3 10*3/uL — ABNORMAL HIGH (ref 4.0–10.5)
nRBC: 0.8 % — ABNORMAL HIGH (ref 0.0–0.2)

## 2022-06-09 ENCOUNTER — Encounter: Payer: Self-pay | Admitting: Cardiology

## 2022-06-10 ENCOUNTER — Encounter: Payer: Self-pay | Admitting: Oncology

## 2022-06-10 ENCOUNTER — Other Ambulatory Visit: Payer: Medicare Other

## 2022-06-11 ENCOUNTER — Encounter: Payer: Self-pay | Admitting: Oncology

## 2022-06-11 MED FILL — Luspatercept-aamt For Subcutaneous Inj 75 MG: SUBCUTANEOUS | Qty: 1.5 | Status: AC

## 2022-06-12 ENCOUNTER — Inpatient Hospital Stay: Payer: Medicare Other

## 2022-06-12 VITALS — BP 135/66 | HR 83 | Temp 98.1°F | Resp 22 | Ht 63.0 in | Wt 135.1 lb

## 2022-06-12 DIAGNOSIS — D469 Myelodysplastic syndrome, unspecified: Secondary | ICD-10-CM

## 2022-06-12 DIAGNOSIS — D46Z Other myelodysplastic syndromes: Secondary | ICD-10-CM | POA: Diagnosis not present

## 2022-06-12 DIAGNOSIS — D643 Other sideroblastic anemias: Secondary | ICD-10-CM

## 2022-06-12 MED ORDER — LUSPATERCEPT-AAMT 75 MG ~~LOC~~ SOLR
1.1000 mg/kg | Freq: Once | SUBCUTANEOUS | Status: AC
  Administered 2022-06-12: 75 mg via SUBCUTANEOUS
  Filled 2022-06-12: qty 1.5

## 2022-06-12 NOTE — Progress Notes (Signed)
Remote pacemaker transmission.   

## 2022-06-13 ENCOUNTER — Other Ambulatory Visit: Payer: Self-pay | Admitting: Oncology

## 2022-06-29 LAB — CBC AND DIFFERENTIAL
HCT: 29 — AB (ref 36–46)
Hemoglobin: 9.3 — AB (ref 12.0–16.0)
Neutrophils Absolute: 20.57
Platelets: 197 10*3/uL (ref 150–400)
WBC: 22.6

## 2022-06-29 LAB — CBC: RBC: 2.97 — AB (ref 3.87–5.11)

## 2022-06-30 ENCOUNTER — Telehealth: Payer: Self-pay

## 2022-06-30 NOTE — Telephone Encounter (Signed)
Pt's granddaughter,Michelle,RN, called to ask if pt needed to come in form lab appt tomorrow, as she had labs drawn yesterday @ hospital before discharge. Pt has been in hospital for hip fracture requiring surgery. She is @ [facility] for rehab.  I printed CBC (from yesterday @ [facility]) for Dr Bobby Rumpf. He states, "I can make my decision with these labs". Pt does not have to come in for lab appt on 07/01/2022. She will need her injection as scheduled on Friday, 07/03/22 @ 130p.

## 2022-07-01 ENCOUNTER — Other Ambulatory Visit: Payer: Medicare Other

## 2022-07-02 ENCOUNTER — Encounter: Payer: Medicare Other | Admitting: Cardiology

## 2022-07-02 ENCOUNTER — Telehealth: Payer: Self-pay | Admitting: Oncology

## 2022-07-02 DIAGNOSIS — S72002D Fracture of unspecified part of neck of left femur, subsequent encounter for closed fracture with routine healing: Secondary | ICD-10-CM | POA: Diagnosis not present

## 2022-07-02 DIAGNOSIS — R2689 Other abnormalities of gait and mobility: Secondary | ICD-10-CM | POA: Diagnosis not present

## 2022-07-02 DIAGNOSIS — R2681 Unsteadiness on feet: Secondary | ICD-10-CM | POA: Diagnosis not present

## 2022-07-02 DIAGNOSIS — S72142A Displaced intertrochanteric fracture of left femur, initial encounter for closed fracture: Secondary | ICD-10-CM | POA: Diagnosis not present

## 2022-07-02 DIAGNOSIS — G8918 Other acute postprocedural pain: Secondary | ICD-10-CM | POA: Diagnosis not present

## 2022-07-02 DIAGNOSIS — D469 Myelodysplastic syndrome, unspecified: Secondary | ICD-10-CM | POA: Diagnosis not present

## 2022-07-02 DIAGNOSIS — K5900 Constipation, unspecified: Secondary | ICD-10-CM | POA: Diagnosis not present

## 2022-07-02 DIAGNOSIS — K219 Gastro-esophageal reflux disease without esophagitis: Secondary | ICD-10-CM | POA: Diagnosis not present

## 2022-07-02 DIAGNOSIS — W19XXXD Unspecified fall, subsequent encounter: Secondary | ICD-10-CM | POA: Diagnosis not present

## 2022-07-02 DIAGNOSIS — N390 Urinary tract infection, site not specified: Secondary | ICD-10-CM | POA: Diagnosis not present

## 2022-07-02 DIAGNOSIS — E039 Hypothyroidism, unspecified: Secondary | ICD-10-CM | POA: Diagnosis not present

## 2022-07-02 DIAGNOSIS — D649 Anemia, unspecified: Secondary | ICD-10-CM | POA: Diagnosis not present

## 2022-07-02 DIAGNOSIS — Z950 Presence of cardiac pacemaker: Secondary | ICD-10-CM | POA: Diagnosis not present

## 2022-07-02 DIAGNOSIS — D62 Acute posthemorrhagic anemia: Secondary | ICD-10-CM | POA: Diagnosis not present

## 2022-07-02 DIAGNOSIS — R262 Difficulty in walking, not elsewhere classified: Secondary | ICD-10-CM | POA: Diagnosis not present

## 2022-07-02 DIAGNOSIS — I10 Essential (primary) hypertension: Secondary | ICD-10-CM | POA: Diagnosis not present

## 2022-07-02 DIAGNOSIS — D46Z Other myelodysplastic syndromes: Secondary | ICD-10-CM | POA: Diagnosis not present

## 2022-07-02 DIAGNOSIS — E46 Unspecified protein-calorie malnutrition: Secondary | ICD-10-CM | POA: Diagnosis not present

## 2022-07-02 DIAGNOSIS — M810 Age-related osteoporosis without current pathological fracture: Secondary | ICD-10-CM | POA: Diagnosis not present

## 2022-07-02 DIAGNOSIS — R601 Generalized edema: Secondary | ICD-10-CM | POA: Diagnosis not present

## 2022-07-02 DIAGNOSIS — R799 Abnormal finding of blood chemistry, unspecified: Secondary | ICD-10-CM | POA: Diagnosis not present

## 2022-07-02 DIAGNOSIS — Z79899 Other long term (current) drug therapy: Secondary | ICD-10-CM | POA: Diagnosis not present

## 2022-07-02 DIAGNOSIS — Z86718 Personal history of other venous thrombosis and embolism: Secondary | ICD-10-CM | POA: Diagnosis not present

## 2022-07-02 DIAGNOSIS — M6281 Muscle weakness (generalized): Secondary | ICD-10-CM | POA: Diagnosis not present

## 2022-07-02 NOTE — Telephone Encounter (Signed)
07/02/22 Spoke with Debbie([facility])and rescheduled all appts

## 2022-07-03 ENCOUNTER — Ambulatory Visit: Payer: Medicare Other

## 2022-07-03 DIAGNOSIS — D62 Acute posthemorrhagic anemia: Secondary | ICD-10-CM | POA: Diagnosis not present

## 2022-07-07 ENCOUNTER — Telehealth: Payer: Self-pay

## 2022-07-07 NOTE — Telephone Encounter (Unsigned)
Odessa Fleming, unit manager from [facility] nursing [facility], called to confirm if last lab results done on  , are ok to use for the injection scheduled this week?

## 2022-07-08 DIAGNOSIS — S72142A Displaced intertrochanteric fracture of left femur, initial encounter for closed fracture: Secondary | ICD-10-CM | POA: Diagnosis not present

## 2022-07-08 MED FILL — Luspatercept-aamt For Subcutaneous Inj 75 MG: SUBCUTANEOUS | Qty: 1.5 | Status: AC

## 2022-07-09 ENCOUNTER — Inpatient Hospital Stay: Payer: Medicare Other | Attending: Oncology

## 2022-07-09 ENCOUNTER — Encounter: Payer: Self-pay | Admitting: Oncology

## 2022-07-09 VITALS — BP 125/58 | HR 76 | Temp 98.3°F | Resp 18 | Ht 63.0 in

## 2022-07-09 DIAGNOSIS — Z79899 Other long term (current) drug therapy: Secondary | ICD-10-CM | POA: Diagnosis not present

## 2022-07-09 DIAGNOSIS — D46Z Other myelodysplastic syndromes: Secondary | ICD-10-CM | POA: Diagnosis not present

## 2022-07-09 DIAGNOSIS — D643 Other sideroblastic anemias: Secondary | ICD-10-CM

## 2022-07-09 DIAGNOSIS — D469 Myelodysplastic syndrome, unspecified: Secondary | ICD-10-CM

## 2022-07-09 MED ORDER — LUSPATERCEPT-AAMT 75 MG ~~LOC~~ SOLR
1.1000 mg/kg | Freq: Once | SUBCUTANEOUS | Status: AC
  Administered 2022-07-09: 75 mg via SUBCUTANEOUS
  Filled 2022-07-09: qty 1.5

## 2022-07-09 NOTE — Patient Instructions (Signed)
Luspatercept Injection What is this medication? LUSPATERCEPT (lus PAT er sept) treats low levels of red blood cells (anemia) in the body in people with beta thalassemia or myelodysplastic syndromes. It works by helping the body make more red blood cells. This medicine may be used for other purposes; ask your health care provider or pharmacist if you have questions. COMMON BRAND NAME(S): REBLOZYL What should I tell my care team before I take this medication? They need to know if you have any of these conditions: Have had your spleen removed High blood pressure History of blood clots Tobacco use An unusual or allergic reaction to luspatercept, other medications, foods, dyes or preservatives Pregnant or trying to get pregnant Breast-feeding How should I use this medication? This medication is for injection under the skin. It is given by your care team in a hospital or clinic setting. Talk to your care team about the use of the medication in children. This medication is not approved for use in children. Overdosage: If you think you have taken too much of this medicine contact a poison control center or emergency room at once. NOTE: This medicine is only for you. Do not share this medicine with others. What if I miss a dose? Keep appointments for follow-up doses. It is important not to miss your dose. Call your care team if you are unable to keep an appointment. What may interact with this medication? Interactions are not expected. This list may not describe all possible interactions. Give your health care provider a list of all the medicines, herbs, non-prescription drugs, or dietary supplements you use. Also tell them if you smoke, drink alcohol, or use illegal drugs. Some items may interact with your medicine. What should I watch for while using this medication? Your condition will be monitored carefully while you are receiving this medication. Talk to your care team if you wish to become  pregnant or think you might be pregnant. This medication can cause serious birth defects. Discuss contraceptive options with your care team. Do not breastfeed while taking this medication. You may need blood work done while you are taking this medication. What side effects may I notice from receiving this medication? Side effects that you should report to your care team as soon as possible: Allergic reactions--skin rash, itching, hives, swelling of the face, lips, tongue, or throat Blood clot--pain, swelling, or warmth in the leg, shortness of breath, chest pain Increase in blood pressure Severe back pain, numbness or weakness of the hands, arms, legs, or feet, loss of coordination, loss of bowel or bladder control Side effects that usually do not require medical attention (report these to your care team if they continue or are bothersome): Bone pain Dizziness Fatigue Headache Joint pain Muscle pain Stomach pain This list may not describe all possible side effects. Call your doctor for medical advice about side effects. You may report side effects to FDA at 1-800-FDA-1088. Where should I keep my medication? This medication is given in a hospital or clinic. It will not be stored at home. NOTE: This sheet is a summary. It may not cover all possible information. If you have questions about this medicine, talk to your doctor, pharmacist, or health care provider.  2023 Elsevier/Gold Standard (2020-12-13 00:00:00)  

## 2022-07-17 DIAGNOSIS — Z79891 Long term (current) use of opiate analgesic: Secondary | ICD-10-CM | POA: Diagnosis not present

## 2022-07-17 DIAGNOSIS — Z556 Problems related to health literacy: Secondary | ICD-10-CM | POA: Diagnosis not present

## 2022-07-17 DIAGNOSIS — M4726 Other spondylosis with radiculopathy, lumbar region: Secondary | ICD-10-CM | POA: Diagnosis not present

## 2022-07-17 DIAGNOSIS — D62 Acute posthemorrhagic anemia: Secondary | ICD-10-CM | POA: Diagnosis not present

## 2022-07-17 DIAGNOSIS — Z9181 History of falling: Secondary | ICD-10-CM | POA: Diagnosis not present

## 2022-07-17 DIAGNOSIS — K5900 Constipation, unspecified: Secondary | ICD-10-CM | POA: Diagnosis not present

## 2022-07-17 DIAGNOSIS — K219 Gastro-esophageal reflux disease without esophagitis: Secondary | ICD-10-CM | POA: Diagnosis not present

## 2022-07-17 DIAGNOSIS — I10 Essential (primary) hypertension: Secondary | ICD-10-CM | POA: Diagnosis not present

## 2022-07-17 DIAGNOSIS — D469 Myelodysplastic syndrome, unspecified: Secondary | ICD-10-CM | POA: Diagnosis not present

## 2022-07-17 DIAGNOSIS — N390 Urinary tract infection, site not specified: Secondary | ICD-10-CM | POA: Diagnosis not present

## 2022-07-17 DIAGNOSIS — Z950 Presence of cardiac pacemaker: Secondary | ICD-10-CM | POA: Diagnosis not present

## 2022-07-17 DIAGNOSIS — B9689 Other specified bacterial agents as the cause of diseases classified elsewhere: Secondary | ICD-10-CM | POA: Diagnosis not present

## 2022-07-17 DIAGNOSIS — J208 Acute bronchitis due to other specified organisms: Secondary | ICD-10-CM | POA: Diagnosis not present

## 2022-07-17 DIAGNOSIS — I739 Peripheral vascular disease, unspecified: Secondary | ICD-10-CM | POA: Diagnosis not present

## 2022-07-17 DIAGNOSIS — K580 Irritable bowel syndrome with diarrhea: Secondary | ICD-10-CM | POA: Diagnosis not present

## 2022-07-17 DIAGNOSIS — E785 Hyperlipidemia, unspecified: Secondary | ICD-10-CM | POA: Diagnosis not present

## 2022-07-17 DIAGNOSIS — E039 Hypothyroidism, unspecified: Secondary | ICD-10-CM | POA: Diagnosis not present

## 2022-07-17 DIAGNOSIS — I872 Venous insufficiency (chronic) (peripheral): Secondary | ICD-10-CM | POA: Diagnosis not present

## 2022-07-17 DIAGNOSIS — E46 Unspecified protein-calorie malnutrition: Secondary | ICD-10-CM | POA: Diagnosis not present

## 2022-07-17 DIAGNOSIS — Z86718 Personal history of other venous thrombosis and embolism: Secondary | ICD-10-CM | POA: Diagnosis not present

## 2022-07-17 DIAGNOSIS — M8580 Other specified disorders of bone density and structure, unspecified site: Secondary | ICD-10-CM | POA: Diagnosis not present

## 2022-07-17 DIAGNOSIS — M5116 Intervertebral disc disorders with radiculopathy, lumbar region: Secondary | ICD-10-CM | POA: Diagnosis not present

## 2022-07-17 DIAGNOSIS — M80052D Age-related osteoporosis with current pathological fracture, left femur, subsequent encounter for fracture with routine healing: Secondary | ICD-10-CM | POA: Diagnosis not present

## 2022-07-17 DIAGNOSIS — Z7901 Long term (current) use of anticoagulants: Secondary | ICD-10-CM | POA: Diagnosis not present

## 2022-07-17 DIAGNOSIS — I480 Paroxysmal atrial fibrillation: Secondary | ICD-10-CM | POA: Diagnosis not present

## 2022-07-20 DIAGNOSIS — K580 Irritable bowel syndrome with diarrhea: Secondary | ICD-10-CM | POA: Diagnosis not present

## 2022-07-20 DIAGNOSIS — I739 Peripheral vascular disease, unspecified: Secondary | ICD-10-CM | POA: Diagnosis not present

## 2022-07-20 DIAGNOSIS — K5900 Constipation, unspecified: Secondary | ICD-10-CM | POA: Diagnosis not present

## 2022-07-20 DIAGNOSIS — S72002D Fracture of unspecified part of neck of left femur, subsequent encounter for closed fracture with routine healing: Secondary | ICD-10-CM | POA: Diagnosis not present

## 2022-07-20 DIAGNOSIS — Z9181 History of falling: Secondary | ICD-10-CM | POA: Diagnosis not present

## 2022-07-20 DIAGNOSIS — I480 Paroxysmal atrial fibrillation: Secondary | ICD-10-CM | POA: Diagnosis not present

## 2022-07-20 DIAGNOSIS — E039 Hypothyroidism, unspecified: Secondary | ICD-10-CM | POA: Diagnosis not present

## 2022-07-20 DIAGNOSIS — M8580 Other specified disorders of bone density and structure, unspecified site: Secondary | ICD-10-CM | POA: Diagnosis not present

## 2022-07-20 DIAGNOSIS — I10 Essential (primary) hypertension: Secondary | ICD-10-CM | POA: Diagnosis not present

## 2022-07-20 DIAGNOSIS — J208 Acute bronchitis due to other specified organisms: Secondary | ICD-10-CM | POA: Diagnosis not present

## 2022-07-20 DIAGNOSIS — M4726 Other spondylosis with radiculopathy, lumbar region: Secondary | ICD-10-CM | POA: Diagnosis not present

## 2022-07-20 DIAGNOSIS — M80052D Age-related osteoporosis with current pathological fracture, left femur, subsequent encounter for fracture with routine healing: Secondary | ICD-10-CM | POA: Diagnosis not present

## 2022-07-20 DIAGNOSIS — Z556 Problems related to health literacy: Secondary | ICD-10-CM | POA: Diagnosis not present

## 2022-07-20 DIAGNOSIS — Z7901 Long term (current) use of anticoagulants: Secondary | ICD-10-CM | POA: Diagnosis not present

## 2022-07-20 DIAGNOSIS — Z950 Presence of cardiac pacemaker: Secondary | ICD-10-CM | POA: Diagnosis not present

## 2022-07-20 DIAGNOSIS — B9689 Other specified bacterial agents as the cause of diseases classified elsewhere: Secondary | ICD-10-CM | POA: Diagnosis not present

## 2022-07-20 DIAGNOSIS — Z86718 Personal history of other venous thrombosis and embolism: Secondary | ICD-10-CM | POA: Diagnosis not present

## 2022-07-20 DIAGNOSIS — D469 Myelodysplastic syndrome, unspecified: Secondary | ICD-10-CM | POA: Diagnosis not present

## 2022-07-20 DIAGNOSIS — E785 Hyperlipidemia, unspecified: Secondary | ICD-10-CM | POA: Diagnosis not present

## 2022-07-20 DIAGNOSIS — D62 Acute posthemorrhagic anemia: Secondary | ICD-10-CM | POA: Diagnosis not present

## 2022-07-20 DIAGNOSIS — K219 Gastro-esophageal reflux disease without esophagitis: Secondary | ICD-10-CM | POA: Diagnosis not present

## 2022-07-20 DIAGNOSIS — I872 Venous insufficiency (chronic) (peripheral): Secondary | ICD-10-CM | POA: Diagnosis not present

## 2022-07-20 DIAGNOSIS — E46 Unspecified protein-calorie malnutrition: Secondary | ICD-10-CM | POA: Diagnosis not present

## 2022-07-20 DIAGNOSIS — N390 Urinary tract infection, site not specified: Secondary | ICD-10-CM | POA: Diagnosis not present

## 2022-07-20 DIAGNOSIS — M5116 Intervertebral disc disorders with radiculopathy, lumbar region: Secondary | ICD-10-CM | POA: Diagnosis not present

## 2022-07-20 DIAGNOSIS — Z79891 Long term (current) use of opiate analgesic: Secondary | ICD-10-CM | POA: Diagnosis not present

## 2022-07-21 DIAGNOSIS — I10 Essential (primary) hypertension: Secondary | ICD-10-CM | POA: Diagnosis not present

## 2022-07-21 DIAGNOSIS — Z7901 Long term (current) use of anticoagulants: Secondary | ICD-10-CM | POA: Diagnosis not present

## 2022-07-21 DIAGNOSIS — Z79891 Long term (current) use of opiate analgesic: Secondary | ICD-10-CM | POA: Diagnosis not present

## 2022-07-21 DIAGNOSIS — D62 Acute posthemorrhagic anemia: Secondary | ICD-10-CM | POA: Diagnosis not present

## 2022-07-21 DIAGNOSIS — K5900 Constipation, unspecified: Secondary | ICD-10-CM | POA: Diagnosis not present

## 2022-07-21 DIAGNOSIS — M8580 Other specified disorders of bone density and structure, unspecified site: Secondary | ICD-10-CM | POA: Diagnosis not present

## 2022-07-21 DIAGNOSIS — J208 Acute bronchitis due to other specified organisms: Secondary | ICD-10-CM | POA: Diagnosis not present

## 2022-07-21 DIAGNOSIS — M5116 Intervertebral disc disorders with radiculopathy, lumbar region: Secondary | ICD-10-CM | POA: Diagnosis not present

## 2022-07-21 DIAGNOSIS — M4726 Other spondylosis with radiculopathy, lumbar region: Secondary | ICD-10-CM | POA: Diagnosis not present

## 2022-07-21 DIAGNOSIS — I480 Paroxysmal atrial fibrillation: Secondary | ICD-10-CM | POA: Diagnosis not present

## 2022-07-21 DIAGNOSIS — D469 Myelodysplastic syndrome, unspecified: Secondary | ICD-10-CM | POA: Diagnosis not present

## 2022-07-21 DIAGNOSIS — Z950 Presence of cardiac pacemaker: Secondary | ICD-10-CM | POA: Diagnosis not present

## 2022-07-21 DIAGNOSIS — I872 Venous insufficiency (chronic) (peripheral): Secondary | ICD-10-CM | POA: Diagnosis not present

## 2022-07-21 DIAGNOSIS — Z9181 History of falling: Secondary | ICD-10-CM | POA: Diagnosis not present

## 2022-07-21 DIAGNOSIS — M80052D Age-related osteoporosis with current pathological fracture, left femur, subsequent encounter for fracture with routine healing: Secondary | ICD-10-CM | POA: Diagnosis not present

## 2022-07-21 DIAGNOSIS — E46 Unspecified protein-calorie malnutrition: Secondary | ICD-10-CM | POA: Diagnosis not present

## 2022-07-21 DIAGNOSIS — N390 Urinary tract infection, site not specified: Secondary | ICD-10-CM | POA: Diagnosis not present

## 2022-07-21 DIAGNOSIS — Z86718 Personal history of other venous thrombosis and embolism: Secondary | ICD-10-CM | POA: Diagnosis not present

## 2022-07-21 DIAGNOSIS — E039 Hypothyroidism, unspecified: Secondary | ICD-10-CM | POA: Diagnosis not present

## 2022-07-21 DIAGNOSIS — K219 Gastro-esophageal reflux disease without esophagitis: Secondary | ICD-10-CM | POA: Diagnosis not present

## 2022-07-21 DIAGNOSIS — I739 Peripheral vascular disease, unspecified: Secondary | ICD-10-CM | POA: Diagnosis not present

## 2022-07-21 DIAGNOSIS — Z556 Problems related to health literacy: Secondary | ICD-10-CM | POA: Diagnosis not present

## 2022-07-21 DIAGNOSIS — B9689 Other specified bacterial agents as the cause of diseases classified elsewhere: Secondary | ICD-10-CM | POA: Diagnosis not present

## 2022-07-21 DIAGNOSIS — K580 Irritable bowel syndrome with diarrhea: Secondary | ICD-10-CM | POA: Diagnosis not present

## 2022-07-21 DIAGNOSIS — E785 Hyperlipidemia, unspecified: Secondary | ICD-10-CM | POA: Diagnosis not present

## 2022-07-22 ENCOUNTER — Other Ambulatory Visit: Payer: Medicare Other

## 2022-07-22 DIAGNOSIS — K580 Irritable bowel syndrome with diarrhea: Secondary | ICD-10-CM | POA: Diagnosis not present

## 2022-07-22 DIAGNOSIS — E039 Hypothyroidism, unspecified: Secondary | ICD-10-CM | POA: Diagnosis not present

## 2022-07-22 DIAGNOSIS — M5116 Intervertebral disc disorders with radiculopathy, lumbar region: Secondary | ICD-10-CM | POA: Diagnosis not present

## 2022-07-22 DIAGNOSIS — M4726 Other spondylosis with radiculopathy, lumbar region: Secondary | ICD-10-CM | POA: Diagnosis not present

## 2022-07-22 DIAGNOSIS — K219 Gastro-esophageal reflux disease without esophagitis: Secondary | ICD-10-CM | POA: Diagnosis not present

## 2022-07-22 DIAGNOSIS — Z950 Presence of cardiac pacemaker: Secondary | ICD-10-CM | POA: Diagnosis not present

## 2022-07-22 DIAGNOSIS — E785 Hyperlipidemia, unspecified: Secondary | ICD-10-CM | POA: Diagnosis not present

## 2022-07-22 DIAGNOSIS — B9689 Other specified bacterial agents as the cause of diseases classified elsewhere: Secondary | ICD-10-CM | POA: Diagnosis not present

## 2022-07-22 DIAGNOSIS — J208 Acute bronchitis due to other specified organisms: Secondary | ICD-10-CM | POA: Diagnosis not present

## 2022-07-22 DIAGNOSIS — Z556 Problems related to health literacy: Secondary | ICD-10-CM | POA: Diagnosis not present

## 2022-07-22 DIAGNOSIS — E46 Unspecified protein-calorie malnutrition: Secondary | ICD-10-CM | POA: Diagnosis not present

## 2022-07-22 DIAGNOSIS — I10 Essential (primary) hypertension: Secondary | ICD-10-CM | POA: Diagnosis not present

## 2022-07-22 DIAGNOSIS — I480 Paroxysmal atrial fibrillation: Secondary | ICD-10-CM | POA: Diagnosis not present

## 2022-07-22 DIAGNOSIS — Z7901 Long term (current) use of anticoagulants: Secondary | ICD-10-CM | POA: Diagnosis not present

## 2022-07-22 DIAGNOSIS — K5900 Constipation, unspecified: Secondary | ICD-10-CM | POA: Diagnosis not present

## 2022-07-22 DIAGNOSIS — Z9181 History of falling: Secondary | ICD-10-CM | POA: Diagnosis not present

## 2022-07-22 DIAGNOSIS — Z79891 Long term (current) use of opiate analgesic: Secondary | ICD-10-CM | POA: Diagnosis not present

## 2022-07-22 DIAGNOSIS — D469 Myelodysplastic syndrome, unspecified: Secondary | ICD-10-CM | POA: Diagnosis not present

## 2022-07-22 DIAGNOSIS — D62 Acute posthemorrhagic anemia: Secondary | ICD-10-CM | POA: Diagnosis not present

## 2022-07-22 DIAGNOSIS — N390 Urinary tract infection, site not specified: Secondary | ICD-10-CM | POA: Diagnosis not present

## 2022-07-22 DIAGNOSIS — M80052D Age-related osteoporosis with current pathological fracture, left femur, subsequent encounter for fracture with routine healing: Secondary | ICD-10-CM | POA: Diagnosis not present

## 2022-07-22 DIAGNOSIS — I739 Peripheral vascular disease, unspecified: Secondary | ICD-10-CM | POA: Diagnosis not present

## 2022-07-22 DIAGNOSIS — Z86718 Personal history of other venous thrombosis and embolism: Secondary | ICD-10-CM | POA: Diagnosis not present

## 2022-07-22 DIAGNOSIS — I872 Venous insufficiency (chronic) (peripheral): Secondary | ICD-10-CM | POA: Diagnosis not present

## 2022-07-22 DIAGNOSIS — M8580 Other specified disorders of bone density and structure, unspecified site: Secondary | ICD-10-CM | POA: Diagnosis not present

## 2022-07-23 ENCOUNTER — Other Ambulatory Visit: Payer: Self-pay | Admitting: Pharmacist

## 2022-07-23 DIAGNOSIS — Z7901 Long term (current) use of anticoagulants: Secondary | ICD-10-CM | POA: Diagnosis not present

## 2022-07-23 DIAGNOSIS — M8580 Other specified disorders of bone density and structure, unspecified site: Secondary | ICD-10-CM | POA: Diagnosis not present

## 2022-07-23 DIAGNOSIS — Z950 Presence of cardiac pacemaker: Secondary | ICD-10-CM | POA: Diagnosis not present

## 2022-07-23 DIAGNOSIS — M80052D Age-related osteoporosis with current pathological fracture, left femur, subsequent encounter for fracture with routine healing: Secondary | ICD-10-CM | POA: Diagnosis not present

## 2022-07-23 DIAGNOSIS — I480 Paroxysmal atrial fibrillation: Secondary | ICD-10-CM | POA: Diagnosis not present

## 2022-07-23 DIAGNOSIS — N390 Urinary tract infection, site not specified: Secondary | ICD-10-CM | POA: Diagnosis not present

## 2022-07-23 DIAGNOSIS — D62 Acute posthemorrhagic anemia: Secondary | ICD-10-CM | POA: Diagnosis not present

## 2022-07-23 DIAGNOSIS — Z79891 Long term (current) use of opiate analgesic: Secondary | ICD-10-CM | POA: Diagnosis not present

## 2022-07-23 DIAGNOSIS — D469 Myelodysplastic syndrome, unspecified: Secondary | ICD-10-CM

## 2022-07-23 DIAGNOSIS — K5900 Constipation, unspecified: Secondary | ICD-10-CM | POA: Diagnosis not present

## 2022-07-23 DIAGNOSIS — M4726 Other spondylosis with radiculopathy, lumbar region: Secondary | ICD-10-CM | POA: Diagnosis not present

## 2022-07-23 DIAGNOSIS — Z556 Problems related to health literacy: Secondary | ICD-10-CM | POA: Diagnosis not present

## 2022-07-23 DIAGNOSIS — E785 Hyperlipidemia, unspecified: Secondary | ICD-10-CM | POA: Diagnosis not present

## 2022-07-23 DIAGNOSIS — J208 Acute bronchitis due to other specified organisms: Secondary | ICD-10-CM | POA: Diagnosis not present

## 2022-07-23 DIAGNOSIS — K580 Irritable bowel syndrome with diarrhea: Secondary | ICD-10-CM | POA: Diagnosis not present

## 2022-07-23 DIAGNOSIS — E46 Unspecified protein-calorie malnutrition: Secondary | ICD-10-CM | POA: Diagnosis not present

## 2022-07-23 DIAGNOSIS — M5116 Intervertebral disc disorders with radiculopathy, lumbar region: Secondary | ICD-10-CM | POA: Diagnosis not present

## 2022-07-23 DIAGNOSIS — E039 Hypothyroidism, unspecified: Secondary | ICD-10-CM | POA: Diagnosis not present

## 2022-07-23 DIAGNOSIS — K219 Gastro-esophageal reflux disease without esophagitis: Secondary | ICD-10-CM | POA: Diagnosis not present

## 2022-07-23 DIAGNOSIS — Z86718 Personal history of other venous thrombosis and embolism: Secondary | ICD-10-CM | POA: Diagnosis not present

## 2022-07-23 DIAGNOSIS — C946 Myelodysplastic disease, not classified: Secondary | ICD-10-CM

## 2022-07-23 DIAGNOSIS — D643 Other sideroblastic anemias: Secondary | ICD-10-CM

## 2022-07-23 DIAGNOSIS — I10 Essential (primary) hypertension: Secondary | ICD-10-CM | POA: Diagnosis not present

## 2022-07-23 DIAGNOSIS — B9689 Other specified bacterial agents as the cause of diseases classified elsewhere: Secondary | ICD-10-CM | POA: Diagnosis not present

## 2022-07-23 DIAGNOSIS — Z9181 History of falling: Secondary | ICD-10-CM | POA: Diagnosis not present

## 2022-07-23 DIAGNOSIS — I739 Peripheral vascular disease, unspecified: Secondary | ICD-10-CM | POA: Diagnosis not present

## 2022-07-23 DIAGNOSIS — I872 Venous insufficiency (chronic) (peripheral): Secondary | ICD-10-CM | POA: Diagnosis not present

## 2022-07-24 ENCOUNTER — Ambulatory Visit: Payer: Medicare Other

## 2022-07-24 DIAGNOSIS — Z86718 Personal history of other venous thrombosis and embolism: Secondary | ICD-10-CM | POA: Diagnosis not present

## 2022-07-24 DIAGNOSIS — D469 Myelodysplastic syndrome, unspecified: Secondary | ICD-10-CM | POA: Diagnosis not present

## 2022-07-24 DIAGNOSIS — D62 Acute posthemorrhagic anemia: Secondary | ICD-10-CM | POA: Diagnosis not present

## 2022-07-24 DIAGNOSIS — E039 Hypothyroidism, unspecified: Secondary | ICD-10-CM | POA: Diagnosis not present

## 2022-07-24 DIAGNOSIS — S72002A Fracture of unspecified part of neck of left femur, initial encounter for closed fracture: Secondary | ICD-10-CM | POA: Diagnosis not present

## 2022-07-24 DIAGNOSIS — M545 Low back pain, unspecified: Secondary | ICD-10-CM | POA: Diagnosis not present

## 2022-07-27 ENCOUNTER — Telehealth: Payer: Self-pay | Admitting: Oncology

## 2022-07-27 DIAGNOSIS — M80052D Age-related osteoporosis with current pathological fracture, left femur, subsequent encounter for fracture with routine healing: Secondary | ICD-10-CM | POA: Diagnosis not present

## 2022-07-27 DIAGNOSIS — Z556 Problems related to health literacy: Secondary | ICD-10-CM | POA: Diagnosis not present

## 2022-07-27 DIAGNOSIS — I10 Essential (primary) hypertension: Secondary | ICD-10-CM | POA: Diagnosis not present

## 2022-07-27 DIAGNOSIS — K5900 Constipation, unspecified: Secondary | ICD-10-CM | POA: Diagnosis not present

## 2022-07-27 DIAGNOSIS — I739 Peripheral vascular disease, unspecified: Secondary | ICD-10-CM | POA: Diagnosis not present

## 2022-07-27 DIAGNOSIS — D469 Myelodysplastic syndrome, unspecified: Secondary | ICD-10-CM | POA: Diagnosis not present

## 2022-07-27 DIAGNOSIS — I480 Paroxysmal atrial fibrillation: Secondary | ICD-10-CM | POA: Diagnosis not present

## 2022-07-27 DIAGNOSIS — Z79891 Long term (current) use of opiate analgesic: Secondary | ICD-10-CM | POA: Diagnosis not present

## 2022-07-27 DIAGNOSIS — Z86718 Personal history of other venous thrombosis and embolism: Secondary | ICD-10-CM | POA: Diagnosis not present

## 2022-07-27 DIAGNOSIS — J208 Acute bronchitis due to other specified organisms: Secondary | ICD-10-CM | POA: Diagnosis not present

## 2022-07-27 DIAGNOSIS — M5116 Intervertebral disc disorders with radiculopathy, lumbar region: Secondary | ICD-10-CM | POA: Diagnosis not present

## 2022-07-27 DIAGNOSIS — K219 Gastro-esophageal reflux disease without esophagitis: Secondary | ICD-10-CM | POA: Diagnosis not present

## 2022-07-27 DIAGNOSIS — E039 Hypothyroidism, unspecified: Secondary | ICD-10-CM | POA: Diagnosis not present

## 2022-07-27 DIAGNOSIS — Z9181 History of falling: Secondary | ICD-10-CM | POA: Diagnosis not present

## 2022-07-27 DIAGNOSIS — M4726 Other spondylosis with radiculopathy, lumbar region: Secondary | ICD-10-CM | POA: Diagnosis not present

## 2022-07-27 DIAGNOSIS — M8580 Other specified disorders of bone density and structure, unspecified site: Secondary | ICD-10-CM | POA: Diagnosis not present

## 2022-07-27 DIAGNOSIS — B9689 Other specified bacterial agents as the cause of diseases classified elsewhere: Secondary | ICD-10-CM | POA: Diagnosis not present

## 2022-07-27 DIAGNOSIS — E785 Hyperlipidemia, unspecified: Secondary | ICD-10-CM | POA: Diagnosis not present

## 2022-07-27 DIAGNOSIS — Z950 Presence of cardiac pacemaker: Secondary | ICD-10-CM | POA: Diagnosis not present

## 2022-07-27 DIAGNOSIS — Z7901 Long term (current) use of anticoagulants: Secondary | ICD-10-CM | POA: Diagnosis not present

## 2022-07-27 DIAGNOSIS — N390 Urinary tract infection, site not specified: Secondary | ICD-10-CM | POA: Diagnosis not present

## 2022-07-27 DIAGNOSIS — I872 Venous insufficiency (chronic) (peripheral): Secondary | ICD-10-CM | POA: Diagnosis not present

## 2022-07-27 DIAGNOSIS — D62 Acute posthemorrhagic anemia: Secondary | ICD-10-CM | POA: Diagnosis not present

## 2022-07-27 DIAGNOSIS — K580 Irritable bowel syndrome with diarrhea: Secondary | ICD-10-CM | POA: Diagnosis not present

## 2022-07-27 DIAGNOSIS — E46 Unspecified protein-calorie malnutrition: Secondary | ICD-10-CM | POA: Diagnosis not present

## 2022-07-27 NOTE — Telephone Encounter (Signed)
Contacted pt to schedule a lab ppt. Unable to reach via phone, voicemail was left.

## 2022-07-28 ENCOUNTER — Other Ambulatory Visit: Payer: Medicare Other

## 2022-07-29 ENCOUNTER — Encounter: Payer: Self-pay | Admitting: Oncology

## 2022-07-29 DIAGNOSIS — K5900 Constipation, unspecified: Secondary | ICD-10-CM | POA: Diagnosis not present

## 2022-07-29 DIAGNOSIS — M4726 Other spondylosis with radiculopathy, lumbar region: Secondary | ICD-10-CM | POA: Diagnosis not present

## 2022-07-29 DIAGNOSIS — M8580 Other specified disorders of bone density and structure, unspecified site: Secondary | ICD-10-CM | POA: Diagnosis not present

## 2022-07-29 DIAGNOSIS — K580 Irritable bowel syndrome with diarrhea: Secondary | ICD-10-CM | POA: Diagnosis not present

## 2022-07-29 DIAGNOSIS — E46 Unspecified protein-calorie malnutrition: Secondary | ICD-10-CM | POA: Diagnosis not present

## 2022-07-29 DIAGNOSIS — M5116 Intervertebral disc disorders with radiculopathy, lumbar region: Secondary | ICD-10-CM | POA: Diagnosis not present

## 2022-07-29 DIAGNOSIS — I739 Peripheral vascular disease, unspecified: Secondary | ICD-10-CM | POA: Diagnosis not present

## 2022-07-29 DIAGNOSIS — Z79891 Long term (current) use of opiate analgesic: Secondary | ICD-10-CM | POA: Diagnosis not present

## 2022-07-29 DIAGNOSIS — K219 Gastro-esophageal reflux disease without esophagitis: Secondary | ICD-10-CM | POA: Diagnosis not present

## 2022-07-29 DIAGNOSIS — D469 Myelodysplastic syndrome, unspecified: Secondary | ICD-10-CM | POA: Diagnosis not present

## 2022-07-29 DIAGNOSIS — Z86718 Personal history of other venous thrombosis and embolism: Secondary | ICD-10-CM | POA: Diagnosis not present

## 2022-07-29 DIAGNOSIS — I10 Essential (primary) hypertension: Secondary | ICD-10-CM | POA: Diagnosis not present

## 2022-07-29 DIAGNOSIS — I872 Venous insufficiency (chronic) (peripheral): Secondary | ICD-10-CM | POA: Diagnosis not present

## 2022-07-29 DIAGNOSIS — M80052D Age-related osteoporosis with current pathological fracture, left femur, subsequent encounter for fracture with routine healing: Secondary | ICD-10-CM | POA: Diagnosis not present

## 2022-07-29 DIAGNOSIS — D62 Acute posthemorrhagic anemia: Secondary | ICD-10-CM | POA: Diagnosis not present

## 2022-07-29 DIAGNOSIS — B9689 Other specified bacterial agents as the cause of diseases classified elsewhere: Secondary | ICD-10-CM | POA: Diagnosis not present

## 2022-07-29 DIAGNOSIS — N390 Urinary tract infection, site not specified: Secondary | ICD-10-CM | POA: Diagnosis not present

## 2022-07-29 DIAGNOSIS — J208 Acute bronchitis due to other specified organisms: Secondary | ICD-10-CM | POA: Diagnosis not present

## 2022-07-29 DIAGNOSIS — Z950 Presence of cardiac pacemaker: Secondary | ICD-10-CM | POA: Diagnosis not present

## 2022-07-29 DIAGNOSIS — I480 Paroxysmal atrial fibrillation: Secondary | ICD-10-CM | POA: Diagnosis not present

## 2022-07-29 DIAGNOSIS — E785 Hyperlipidemia, unspecified: Secondary | ICD-10-CM | POA: Diagnosis not present

## 2022-07-29 DIAGNOSIS — Z556 Problems related to health literacy: Secondary | ICD-10-CM | POA: Diagnosis not present

## 2022-07-29 DIAGNOSIS — Z9181 History of falling: Secondary | ICD-10-CM | POA: Diagnosis not present

## 2022-07-29 DIAGNOSIS — Z7901 Long term (current) use of anticoagulants: Secondary | ICD-10-CM | POA: Diagnosis not present

## 2022-07-29 DIAGNOSIS — E039 Hypothyroidism, unspecified: Secondary | ICD-10-CM | POA: Diagnosis not present

## 2022-07-30 ENCOUNTER — Inpatient Hospital Stay: Payer: Medicare Other

## 2022-07-30 VITALS — BP 104/94 | HR 65 | Temp 97.6°F | Resp 18

## 2022-07-30 DIAGNOSIS — D643 Other sideroblastic anemias: Secondary | ICD-10-CM

## 2022-07-30 DIAGNOSIS — D46Z Other myelodysplastic syndromes: Secondary | ICD-10-CM | POA: Diagnosis not present

## 2022-07-30 DIAGNOSIS — Z79899 Other long term (current) drug therapy: Secondary | ICD-10-CM | POA: Diagnosis not present

## 2022-07-30 DIAGNOSIS — D469 Myelodysplastic syndrome, unspecified: Secondary | ICD-10-CM

## 2022-07-30 MED ORDER — LUSPATERCEPT-AAMT 75 MG ~~LOC~~ SOLR
1.2000 mg/kg | Freq: Once | SUBCUTANEOUS | Status: AC
  Administered 2022-07-30: 75 mg via SUBCUTANEOUS
  Filled 2022-07-30: qty 1.5

## 2022-07-30 NOTE — Patient Instructions (Signed)
Luspatercept Injection What is this medication? LUSPATERCEPT (lus PAT er sept) treats low levels of red blood cells (anemia) in the body in people with beta thalassemia or myelodysplastic syndromes. It works by helping the body make more red blood cells. This medicine may be used for other purposes; ask your health care provider or pharmacist if you have questions. COMMON BRAND NAME(S): REBLOZYL What should I tell my care team before I take this medication? They need to know if you have any of these conditions: Have had your spleen removed High blood pressure History of blood clots Tobacco use An unusual or allergic reaction to luspatercept, other medications, foods, dyes or preservatives Pregnant or trying to get pregnant Breast-feeding How should I use this medication? This medication is for injection under the skin. It is given by your care team in a hospital or clinic setting. Talk to your care team about the use of the medication in children. This medication is not approved for use in children. Overdosage: If you think you have taken too much of this medicine contact a poison control center or emergency room at once. NOTE: This medicine is only for you. Do not share this medicine with others. What if I miss a dose? Keep appointments for follow-up doses. It is important not to miss your dose. Call your care team if you are unable to keep an appointment. What may interact with this medication? Interactions are not expected. This list may not describe all possible interactions. Give your health care provider a list of all the medicines, herbs, non-prescription drugs, or dietary supplements you use. Also tell them if you smoke, drink alcohol, or use illegal drugs. Some items may interact with your medicine. What should I watch for while using this medication? Your condition will be monitored carefully while you are receiving this medication. Talk to your care team if you wish to become  pregnant or think you might be pregnant. This medication can cause serious birth defects. Discuss contraceptive options with your care team. Do not breastfeed while taking this medication. You may need blood work done while you are taking this medication. What side effects may I notice from receiving this medication? Side effects that you should report to your care team as soon as possible: Allergic reactions--skin rash, itching, hives, swelling of the face, lips, tongue, or throat Blood clot--pain, swelling, or warmth in the leg, shortness of breath, chest pain Increase in blood pressure Severe back pain, numbness or weakness of the hands, arms, legs, or feet, loss of coordination, loss of bowel or bladder control Side effects that usually do not require medical attention (report these to your care team if they continue or are bothersome): Bone pain Dizziness Fatigue Headache Joint pain Muscle pain Stomach pain This list may not describe all possible side effects. Call your doctor for medical advice about side effects. You may report side effects to FDA at 1-800-FDA-1088. Where should I keep my medication? This medication is given in a hospital or clinic. It will not be stored at home. NOTE: This sheet is a summary. It may not cover all possible information. If you have questions about this medicine, talk to your doctor, pharmacist, or health care provider.  2023 Elsevier/Gold Standard (2020-12-13 00:00:00)

## 2022-07-31 DIAGNOSIS — J208 Acute bronchitis due to other specified organisms: Secondary | ICD-10-CM | POA: Diagnosis not present

## 2022-07-31 DIAGNOSIS — K5900 Constipation, unspecified: Secondary | ICD-10-CM | POA: Diagnosis not present

## 2022-07-31 DIAGNOSIS — D62 Acute posthemorrhagic anemia: Secondary | ICD-10-CM | POA: Diagnosis not present

## 2022-07-31 DIAGNOSIS — Z86718 Personal history of other venous thrombosis and embolism: Secondary | ICD-10-CM | POA: Diagnosis not present

## 2022-07-31 DIAGNOSIS — I872 Venous insufficiency (chronic) (peripheral): Secondary | ICD-10-CM | POA: Diagnosis not present

## 2022-07-31 DIAGNOSIS — E46 Unspecified protein-calorie malnutrition: Secondary | ICD-10-CM | POA: Diagnosis not present

## 2022-07-31 DIAGNOSIS — M8580 Other specified disorders of bone density and structure, unspecified site: Secondary | ICD-10-CM | POA: Diagnosis not present

## 2022-07-31 DIAGNOSIS — E785 Hyperlipidemia, unspecified: Secondary | ICD-10-CM | POA: Diagnosis not present

## 2022-07-31 DIAGNOSIS — M5116 Intervertebral disc disorders with radiculopathy, lumbar region: Secondary | ICD-10-CM | POA: Diagnosis not present

## 2022-07-31 DIAGNOSIS — Z7901 Long term (current) use of anticoagulants: Secondary | ICD-10-CM | POA: Diagnosis not present

## 2022-07-31 DIAGNOSIS — I739 Peripheral vascular disease, unspecified: Secondary | ICD-10-CM | POA: Diagnosis not present

## 2022-07-31 DIAGNOSIS — I10 Essential (primary) hypertension: Secondary | ICD-10-CM | POA: Diagnosis not present

## 2022-07-31 DIAGNOSIS — Z950 Presence of cardiac pacemaker: Secondary | ICD-10-CM | POA: Diagnosis not present

## 2022-07-31 DIAGNOSIS — Z556 Problems related to health literacy: Secondary | ICD-10-CM | POA: Diagnosis not present

## 2022-07-31 DIAGNOSIS — N390 Urinary tract infection, site not specified: Secondary | ICD-10-CM | POA: Diagnosis not present

## 2022-07-31 DIAGNOSIS — K580 Irritable bowel syndrome with diarrhea: Secondary | ICD-10-CM | POA: Diagnosis not present

## 2022-07-31 DIAGNOSIS — D469 Myelodysplastic syndrome, unspecified: Secondary | ICD-10-CM | POA: Diagnosis not present

## 2022-07-31 DIAGNOSIS — Z9181 History of falling: Secondary | ICD-10-CM | POA: Diagnosis not present

## 2022-07-31 DIAGNOSIS — Z79891 Long term (current) use of opiate analgesic: Secondary | ICD-10-CM | POA: Diagnosis not present

## 2022-07-31 DIAGNOSIS — E039 Hypothyroidism, unspecified: Secondary | ICD-10-CM | POA: Diagnosis not present

## 2022-07-31 DIAGNOSIS — M80052D Age-related osteoporosis with current pathological fracture, left femur, subsequent encounter for fracture with routine healing: Secondary | ICD-10-CM | POA: Diagnosis not present

## 2022-07-31 DIAGNOSIS — B9689 Other specified bacterial agents as the cause of diseases classified elsewhere: Secondary | ICD-10-CM | POA: Diagnosis not present

## 2022-07-31 DIAGNOSIS — K219 Gastro-esophageal reflux disease without esophagitis: Secondary | ICD-10-CM | POA: Diagnosis not present

## 2022-07-31 DIAGNOSIS — M4726 Other spondylosis with radiculopathy, lumbar region: Secondary | ICD-10-CM | POA: Diagnosis not present

## 2022-07-31 DIAGNOSIS — I480 Paroxysmal atrial fibrillation: Secondary | ICD-10-CM | POA: Diagnosis not present

## 2022-08-03 DIAGNOSIS — Z79891 Long term (current) use of opiate analgesic: Secondary | ICD-10-CM | POA: Diagnosis not present

## 2022-08-03 DIAGNOSIS — B9689 Other specified bacterial agents as the cause of diseases classified elsewhere: Secondary | ICD-10-CM | POA: Diagnosis not present

## 2022-08-03 DIAGNOSIS — N390 Urinary tract infection, site not specified: Secondary | ICD-10-CM | POA: Diagnosis not present

## 2022-08-03 DIAGNOSIS — I10 Essential (primary) hypertension: Secondary | ICD-10-CM | POA: Diagnosis not present

## 2022-08-03 DIAGNOSIS — M5116 Intervertebral disc disorders with radiculopathy, lumbar region: Secondary | ICD-10-CM | POA: Diagnosis not present

## 2022-08-03 DIAGNOSIS — M4726 Other spondylosis with radiculopathy, lumbar region: Secondary | ICD-10-CM | POA: Diagnosis not present

## 2022-08-03 DIAGNOSIS — K580 Irritable bowel syndrome with diarrhea: Secondary | ICD-10-CM | POA: Diagnosis not present

## 2022-08-03 DIAGNOSIS — I872 Venous insufficiency (chronic) (peripheral): Secondary | ICD-10-CM | POA: Diagnosis not present

## 2022-08-03 DIAGNOSIS — D62 Acute posthemorrhagic anemia: Secondary | ICD-10-CM | POA: Diagnosis not present

## 2022-08-03 DIAGNOSIS — Z9181 History of falling: Secondary | ICD-10-CM | POA: Diagnosis not present

## 2022-08-03 DIAGNOSIS — E039 Hypothyroidism, unspecified: Secondary | ICD-10-CM | POA: Diagnosis not present

## 2022-08-03 DIAGNOSIS — M80052D Age-related osteoporosis with current pathological fracture, left femur, subsequent encounter for fracture with routine healing: Secondary | ICD-10-CM | POA: Diagnosis not present

## 2022-08-03 DIAGNOSIS — D469 Myelodysplastic syndrome, unspecified: Secondary | ICD-10-CM | POA: Diagnosis not present

## 2022-08-03 DIAGNOSIS — M8580 Other specified disorders of bone density and structure, unspecified site: Secondary | ICD-10-CM | POA: Diagnosis not present

## 2022-08-03 DIAGNOSIS — J208 Acute bronchitis due to other specified organisms: Secondary | ICD-10-CM | POA: Diagnosis not present

## 2022-08-03 DIAGNOSIS — Z556 Problems related to health literacy: Secondary | ICD-10-CM | POA: Diagnosis not present

## 2022-08-03 DIAGNOSIS — K5900 Constipation, unspecified: Secondary | ICD-10-CM | POA: Diagnosis not present

## 2022-08-03 DIAGNOSIS — Z7901 Long term (current) use of anticoagulants: Secondary | ICD-10-CM | POA: Diagnosis not present

## 2022-08-03 DIAGNOSIS — E46 Unspecified protein-calorie malnutrition: Secondary | ICD-10-CM | POA: Diagnosis not present

## 2022-08-03 DIAGNOSIS — E785 Hyperlipidemia, unspecified: Secondary | ICD-10-CM | POA: Diagnosis not present

## 2022-08-03 DIAGNOSIS — Z86718 Personal history of other venous thrombosis and embolism: Secondary | ICD-10-CM | POA: Diagnosis not present

## 2022-08-03 DIAGNOSIS — Z950 Presence of cardiac pacemaker: Secondary | ICD-10-CM | POA: Diagnosis not present

## 2022-08-03 DIAGNOSIS — I739 Peripheral vascular disease, unspecified: Secondary | ICD-10-CM | POA: Diagnosis not present

## 2022-08-03 DIAGNOSIS — I480 Paroxysmal atrial fibrillation: Secondary | ICD-10-CM | POA: Diagnosis not present

## 2022-08-03 DIAGNOSIS — K219 Gastro-esophageal reflux disease without esophagitis: Secondary | ICD-10-CM | POA: Diagnosis not present

## 2022-08-04 DIAGNOSIS — M8580 Other specified disorders of bone density and structure, unspecified site: Secondary | ICD-10-CM | POA: Diagnosis not present

## 2022-08-04 DIAGNOSIS — D469 Myelodysplastic syndrome, unspecified: Secondary | ICD-10-CM | POA: Diagnosis not present

## 2022-08-04 DIAGNOSIS — K219 Gastro-esophageal reflux disease without esophagitis: Secondary | ICD-10-CM | POA: Diagnosis not present

## 2022-08-04 DIAGNOSIS — E46 Unspecified protein-calorie malnutrition: Secondary | ICD-10-CM | POA: Diagnosis not present

## 2022-08-04 DIAGNOSIS — M5116 Intervertebral disc disorders with radiculopathy, lumbar region: Secondary | ICD-10-CM | POA: Diagnosis not present

## 2022-08-04 DIAGNOSIS — J208 Acute bronchitis due to other specified organisms: Secondary | ICD-10-CM | POA: Diagnosis not present

## 2022-08-04 DIAGNOSIS — D62 Acute posthemorrhagic anemia: Secondary | ICD-10-CM | POA: Diagnosis not present

## 2022-08-04 DIAGNOSIS — K580 Irritable bowel syndrome with diarrhea: Secondary | ICD-10-CM | POA: Diagnosis not present

## 2022-08-04 DIAGNOSIS — I872 Venous insufficiency (chronic) (peripheral): Secondary | ICD-10-CM | POA: Diagnosis not present

## 2022-08-04 DIAGNOSIS — E785 Hyperlipidemia, unspecified: Secondary | ICD-10-CM | POA: Diagnosis not present

## 2022-08-04 DIAGNOSIS — M80052D Age-related osteoporosis with current pathological fracture, left femur, subsequent encounter for fracture with routine healing: Secondary | ICD-10-CM | POA: Diagnosis not present

## 2022-08-04 DIAGNOSIS — K5900 Constipation, unspecified: Secondary | ICD-10-CM | POA: Diagnosis not present

## 2022-08-04 DIAGNOSIS — Z79891 Long term (current) use of opiate analgesic: Secondary | ICD-10-CM | POA: Diagnosis not present

## 2022-08-04 DIAGNOSIS — E039 Hypothyroidism, unspecified: Secondary | ICD-10-CM | POA: Diagnosis not present

## 2022-08-04 DIAGNOSIS — Z556 Problems related to health literacy: Secondary | ICD-10-CM | POA: Diagnosis not present

## 2022-08-04 DIAGNOSIS — M4726 Other spondylosis with radiculopathy, lumbar region: Secondary | ICD-10-CM | POA: Diagnosis not present

## 2022-08-04 DIAGNOSIS — Z950 Presence of cardiac pacemaker: Secondary | ICD-10-CM | POA: Diagnosis not present

## 2022-08-04 DIAGNOSIS — Z86718 Personal history of other venous thrombosis and embolism: Secondary | ICD-10-CM | POA: Diagnosis not present

## 2022-08-04 DIAGNOSIS — Z7901 Long term (current) use of anticoagulants: Secondary | ICD-10-CM | POA: Diagnosis not present

## 2022-08-04 DIAGNOSIS — N390 Urinary tract infection, site not specified: Secondary | ICD-10-CM | POA: Diagnosis not present

## 2022-08-04 DIAGNOSIS — Z9181 History of falling: Secondary | ICD-10-CM | POA: Diagnosis not present

## 2022-08-04 DIAGNOSIS — I10 Essential (primary) hypertension: Secondary | ICD-10-CM | POA: Diagnosis not present

## 2022-08-04 DIAGNOSIS — B9689 Other specified bacterial agents as the cause of diseases classified elsewhere: Secondary | ICD-10-CM | POA: Diagnosis not present

## 2022-08-04 DIAGNOSIS — I480 Paroxysmal atrial fibrillation: Secondary | ICD-10-CM | POA: Diagnosis not present

## 2022-08-04 DIAGNOSIS — I739 Peripheral vascular disease, unspecified: Secondary | ICD-10-CM | POA: Diagnosis not present

## 2022-08-05 DIAGNOSIS — I480 Paroxysmal atrial fibrillation: Secondary | ICD-10-CM | POA: Diagnosis not present

## 2022-08-05 DIAGNOSIS — Z7901 Long term (current) use of anticoagulants: Secondary | ICD-10-CM | POA: Diagnosis not present

## 2022-08-05 DIAGNOSIS — K580 Irritable bowel syndrome with diarrhea: Secondary | ICD-10-CM | POA: Diagnosis not present

## 2022-08-05 DIAGNOSIS — E039 Hypothyroidism, unspecified: Secondary | ICD-10-CM | POA: Diagnosis not present

## 2022-08-05 DIAGNOSIS — D469 Myelodysplastic syndrome, unspecified: Secondary | ICD-10-CM | POA: Diagnosis not present

## 2022-08-05 DIAGNOSIS — B9689 Other specified bacterial agents as the cause of diseases classified elsewhere: Secondary | ICD-10-CM | POA: Diagnosis not present

## 2022-08-05 DIAGNOSIS — I10 Essential (primary) hypertension: Secondary | ICD-10-CM | POA: Diagnosis not present

## 2022-08-05 DIAGNOSIS — J208 Acute bronchitis due to other specified organisms: Secondary | ICD-10-CM | POA: Diagnosis not present

## 2022-08-05 DIAGNOSIS — E46 Unspecified protein-calorie malnutrition: Secondary | ICD-10-CM | POA: Diagnosis not present

## 2022-08-05 DIAGNOSIS — M80052D Age-related osteoporosis with current pathological fracture, left femur, subsequent encounter for fracture with routine healing: Secondary | ICD-10-CM | POA: Diagnosis not present

## 2022-08-05 DIAGNOSIS — I739 Peripheral vascular disease, unspecified: Secondary | ICD-10-CM | POA: Diagnosis not present

## 2022-08-05 DIAGNOSIS — M8580 Other specified disorders of bone density and structure, unspecified site: Secondary | ICD-10-CM | POA: Diagnosis not present

## 2022-08-05 DIAGNOSIS — Z556 Problems related to health literacy: Secondary | ICD-10-CM | POA: Diagnosis not present

## 2022-08-05 DIAGNOSIS — N390 Urinary tract infection, site not specified: Secondary | ICD-10-CM | POA: Diagnosis not present

## 2022-08-05 DIAGNOSIS — K219 Gastro-esophageal reflux disease without esophagitis: Secondary | ICD-10-CM | POA: Diagnosis not present

## 2022-08-05 DIAGNOSIS — Z86718 Personal history of other venous thrombosis and embolism: Secondary | ICD-10-CM | POA: Diagnosis not present

## 2022-08-05 DIAGNOSIS — Z950 Presence of cardiac pacemaker: Secondary | ICD-10-CM | POA: Diagnosis not present

## 2022-08-05 DIAGNOSIS — D62 Acute posthemorrhagic anemia: Secondary | ICD-10-CM | POA: Diagnosis not present

## 2022-08-05 DIAGNOSIS — Z9181 History of falling: Secondary | ICD-10-CM | POA: Diagnosis not present

## 2022-08-05 DIAGNOSIS — I872 Venous insufficiency (chronic) (peripheral): Secondary | ICD-10-CM | POA: Diagnosis not present

## 2022-08-05 DIAGNOSIS — Z79891 Long term (current) use of opiate analgesic: Secondary | ICD-10-CM | POA: Diagnosis not present

## 2022-08-05 DIAGNOSIS — M4726 Other spondylosis with radiculopathy, lumbar region: Secondary | ICD-10-CM | POA: Diagnosis not present

## 2022-08-05 DIAGNOSIS — E785 Hyperlipidemia, unspecified: Secondary | ICD-10-CM | POA: Diagnosis not present

## 2022-08-05 DIAGNOSIS — K5900 Constipation, unspecified: Secondary | ICD-10-CM | POA: Diagnosis not present

## 2022-08-05 DIAGNOSIS — M5116 Intervertebral disc disorders with radiculopathy, lumbar region: Secondary | ICD-10-CM | POA: Diagnosis not present

## 2022-08-06 DIAGNOSIS — Z950 Presence of cardiac pacemaker: Secondary | ICD-10-CM | POA: Diagnosis not present

## 2022-08-06 DIAGNOSIS — Z556 Problems related to health literacy: Secondary | ICD-10-CM | POA: Diagnosis not present

## 2022-08-06 DIAGNOSIS — E46 Unspecified protein-calorie malnutrition: Secondary | ICD-10-CM | POA: Diagnosis not present

## 2022-08-06 DIAGNOSIS — M4726 Other spondylosis with radiculopathy, lumbar region: Secondary | ICD-10-CM | POA: Diagnosis not present

## 2022-08-06 DIAGNOSIS — K5900 Constipation, unspecified: Secondary | ICD-10-CM | POA: Diagnosis not present

## 2022-08-06 DIAGNOSIS — Z7901 Long term (current) use of anticoagulants: Secondary | ICD-10-CM | POA: Diagnosis not present

## 2022-08-06 DIAGNOSIS — Z9181 History of falling: Secondary | ICD-10-CM | POA: Diagnosis not present

## 2022-08-06 DIAGNOSIS — I739 Peripheral vascular disease, unspecified: Secondary | ICD-10-CM | POA: Diagnosis not present

## 2022-08-06 DIAGNOSIS — K219 Gastro-esophageal reflux disease without esophagitis: Secondary | ICD-10-CM | POA: Diagnosis not present

## 2022-08-06 DIAGNOSIS — I872 Venous insufficiency (chronic) (peripheral): Secondary | ICD-10-CM | POA: Diagnosis not present

## 2022-08-06 DIAGNOSIS — J208 Acute bronchitis due to other specified organisms: Secondary | ICD-10-CM | POA: Diagnosis not present

## 2022-08-06 DIAGNOSIS — M5116 Intervertebral disc disorders with radiculopathy, lumbar region: Secondary | ICD-10-CM | POA: Diagnosis not present

## 2022-08-06 DIAGNOSIS — M8580 Other specified disorders of bone density and structure, unspecified site: Secondary | ICD-10-CM | POA: Diagnosis not present

## 2022-08-06 DIAGNOSIS — M80052D Age-related osteoporosis with current pathological fracture, left femur, subsequent encounter for fracture with routine healing: Secondary | ICD-10-CM | POA: Diagnosis not present

## 2022-08-06 DIAGNOSIS — I10 Essential (primary) hypertension: Secondary | ICD-10-CM | POA: Diagnosis not present

## 2022-08-06 DIAGNOSIS — I480 Paroxysmal atrial fibrillation: Secondary | ICD-10-CM | POA: Diagnosis not present

## 2022-08-06 DIAGNOSIS — Z79891 Long term (current) use of opiate analgesic: Secondary | ICD-10-CM | POA: Diagnosis not present

## 2022-08-06 DIAGNOSIS — E785 Hyperlipidemia, unspecified: Secondary | ICD-10-CM | POA: Diagnosis not present

## 2022-08-06 DIAGNOSIS — B9689 Other specified bacterial agents as the cause of diseases classified elsewhere: Secondary | ICD-10-CM | POA: Diagnosis not present

## 2022-08-06 DIAGNOSIS — Z86718 Personal history of other venous thrombosis and embolism: Secondary | ICD-10-CM | POA: Diagnosis not present

## 2022-08-06 DIAGNOSIS — K580 Irritable bowel syndrome with diarrhea: Secondary | ICD-10-CM | POA: Diagnosis not present

## 2022-08-06 DIAGNOSIS — D62 Acute posthemorrhagic anemia: Secondary | ICD-10-CM | POA: Diagnosis not present

## 2022-08-06 DIAGNOSIS — D469 Myelodysplastic syndrome, unspecified: Secondary | ICD-10-CM | POA: Diagnosis not present

## 2022-08-06 DIAGNOSIS — E039 Hypothyroidism, unspecified: Secondary | ICD-10-CM | POA: Diagnosis not present

## 2022-08-06 DIAGNOSIS — N390 Urinary tract infection, site not specified: Secondary | ICD-10-CM | POA: Diagnosis not present

## 2022-08-11 ENCOUNTER — Ambulatory Visit: Payer: Medicare Other | Admitting: Oncology

## 2022-08-11 ENCOUNTER — Other Ambulatory Visit: Payer: Medicare Other

## 2022-08-11 DIAGNOSIS — J208 Acute bronchitis due to other specified organisms: Secondary | ICD-10-CM | POA: Diagnosis not present

## 2022-08-11 DIAGNOSIS — I480 Paroxysmal atrial fibrillation: Secondary | ICD-10-CM | POA: Diagnosis not present

## 2022-08-11 DIAGNOSIS — I739 Peripheral vascular disease, unspecified: Secondary | ICD-10-CM | POA: Diagnosis not present

## 2022-08-11 DIAGNOSIS — Z7901 Long term (current) use of anticoagulants: Secondary | ICD-10-CM | POA: Diagnosis not present

## 2022-08-11 DIAGNOSIS — E039 Hypothyroidism, unspecified: Secondary | ICD-10-CM | POA: Diagnosis not present

## 2022-08-11 DIAGNOSIS — Z79891 Long term (current) use of opiate analgesic: Secondary | ICD-10-CM | POA: Diagnosis not present

## 2022-08-11 DIAGNOSIS — Z950 Presence of cardiac pacemaker: Secondary | ICD-10-CM | POA: Diagnosis not present

## 2022-08-11 DIAGNOSIS — N390 Urinary tract infection, site not specified: Secondary | ICD-10-CM | POA: Diagnosis not present

## 2022-08-11 DIAGNOSIS — Z86718 Personal history of other venous thrombosis and embolism: Secondary | ICD-10-CM | POA: Diagnosis not present

## 2022-08-11 DIAGNOSIS — M8580 Other specified disorders of bone density and structure, unspecified site: Secondary | ICD-10-CM | POA: Diagnosis not present

## 2022-08-11 DIAGNOSIS — B9689 Other specified bacterial agents as the cause of diseases classified elsewhere: Secondary | ICD-10-CM | POA: Diagnosis not present

## 2022-08-11 DIAGNOSIS — D62 Acute posthemorrhagic anemia: Secondary | ICD-10-CM | POA: Diagnosis not present

## 2022-08-11 DIAGNOSIS — Z9181 History of falling: Secondary | ICD-10-CM | POA: Diagnosis not present

## 2022-08-11 DIAGNOSIS — I872 Venous insufficiency (chronic) (peripheral): Secondary | ICD-10-CM | POA: Diagnosis not present

## 2022-08-11 DIAGNOSIS — E46 Unspecified protein-calorie malnutrition: Secondary | ICD-10-CM | POA: Diagnosis not present

## 2022-08-11 DIAGNOSIS — M4726 Other spondylosis with radiculopathy, lumbar region: Secondary | ICD-10-CM | POA: Diagnosis not present

## 2022-08-11 DIAGNOSIS — Z556 Problems related to health literacy: Secondary | ICD-10-CM | POA: Diagnosis not present

## 2022-08-11 DIAGNOSIS — K5900 Constipation, unspecified: Secondary | ICD-10-CM | POA: Diagnosis not present

## 2022-08-11 DIAGNOSIS — M5116 Intervertebral disc disorders with radiculopathy, lumbar region: Secondary | ICD-10-CM | POA: Diagnosis not present

## 2022-08-11 DIAGNOSIS — K580 Irritable bowel syndrome with diarrhea: Secondary | ICD-10-CM | POA: Diagnosis not present

## 2022-08-11 DIAGNOSIS — M80052D Age-related osteoporosis with current pathological fracture, left femur, subsequent encounter for fracture with routine healing: Secondary | ICD-10-CM | POA: Diagnosis not present

## 2022-08-11 DIAGNOSIS — E785 Hyperlipidemia, unspecified: Secondary | ICD-10-CM | POA: Diagnosis not present

## 2022-08-11 DIAGNOSIS — K219 Gastro-esophageal reflux disease without esophagitis: Secondary | ICD-10-CM | POA: Diagnosis not present

## 2022-08-11 DIAGNOSIS — D469 Myelodysplastic syndrome, unspecified: Secondary | ICD-10-CM | POA: Diagnosis not present

## 2022-08-11 DIAGNOSIS — I10 Essential (primary) hypertension: Secondary | ICD-10-CM | POA: Diagnosis not present

## 2022-08-13 ENCOUNTER — Ambulatory Visit: Payer: Medicare Other

## 2022-08-13 DIAGNOSIS — S72142A Displaced intertrochanteric fracture of left femur, initial encounter for closed fracture: Secondary | ICD-10-CM | POA: Diagnosis not present

## 2022-08-14 DIAGNOSIS — M5116 Intervertebral disc disorders with radiculopathy, lumbar region: Secondary | ICD-10-CM | POA: Diagnosis not present

## 2022-08-14 DIAGNOSIS — M8580 Other specified disorders of bone density and structure, unspecified site: Secondary | ICD-10-CM | POA: Diagnosis not present

## 2022-08-14 DIAGNOSIS — Z9181 History of falling: Secondary | ICD-10-CM | POA: Diagnosis not present

## 2022-08-14 DIAGNOSIS — K580 Irritable bowel syndrome with diarrhea: Secondary | ICD-10-CM | POA: Diagnosis not present

## 2022-08-14 DIAGNOSIS — I480 Paroxysmal atrial fibrillation: Secondary | ICD-10-CM | POA: Diagnosis not present

## 2022-08-14 DIAGNOSIS — I872 Venous insufficiency (chronic) (peripheral): Secondary | ICD-10-CM | POA: Diagnosis not present

## 2022-08-14 DIAGNOSIS — Z556 Problems related to health literacy: Secondary | ICD-10-CM | POA: Diagnosis not present

## 2022-08-14 DIAGNOSIS — M80052D Age-related osteoporosis with current pathological fracture, left femur, subsequent encounter for fracture with routine healing: Secondary | ICD-10-CM | POA: Diagnosis not present

## 2022-08-14 DIAGNOSIS — E785 Hyperlipidemia, unspecified: Secondary | ICD-10-CM | POA: Diagnosis not present

## 2022-08-14 DIAGNOSIS — N390 Urinary tract infection, site not specified: Secondary | ICD-10-CM | POA: Diagnosis not present

## 2022-08-14 DIAGNOSIS — Z950 Presence of cardiac pacemaker: Secondary | ICD-10-CM | POA: Diagnosis not present

## 2022-08-14 DIAGNOSIS — K219 Gastro-esophageal reflux disease without esophagitis: Secondary | ICD-10-CM | POA: Diagnosis not present

## 2022-08-14 DIAGNOSIS — I739 Peripheral vascular disease, unspecified: Secondary | ICD-10-CM | POA: Diagnosis not present

## 2022-08-14 DIAGNOSIS — B9689 Other specified bacterial agents as the cause of diseases classified elsewhere: Secondary | ICD-10-CM | POA: Diagnosis not present

## 2022-08-14 DIAGNOSIS — M4726 Other spondylosis with radiculopathy, lumbar region: Secondary | ICD-10-CM | POA: Diagnosis not present

## 2022-08-14 DIAGNOSIS — J208 Acute bronchitis due to other specified organisms: Secondary | ICD-10-CM | POA: Diagnosis not present

## 2022-08-14 DIAGNOSIS — D62 Acute posthemorrhagic anemia: Secondary | ICD-10-CM | POA: Diagnosis not present

## 2022-08-14 DIAGNOSIS — Z79891 Long term (current) use of opiate analgesic: Secondary | ICD-10-CM | POA: Diagnosis not present

## 2022-08-14 DIAGNOSIS — D469 Myelodysplastic syndrome, unspecified: Secondary | ICD-10-CM | POA: Diagnosis not present

## 2022-08-14 DIAGNOSIS — E46 Unspecified protein-calorie malnutrition: Secondary | ICD-10-CM | POA: Diagnosis not present

## 2022-08-14 DIAGNOSIS — E039 Hypothyroidism, unspecified: Secondary | ICD-10-CM | POA: Diagnosis not present

## 2022-08-14 DIAGNOSIS — K5900 Constipation, unspecified: Secondary | ICD-10-CM | POA: Diagnosis not present

## 2022-08-14 DIAGNOSIS — I10 Essential (primary) hypertension: Secondary | ICD-10-CM | POA: Diagnosis not present

## 2022-08-14 DIAGNOSIS — Z86718 Personal history of other venous thrombosis and embolism: Secondary | ICD-10-CM | POA: Diagnosis not present

## 2022-08-14 DIAGNOSIS — Z7901 Long term (current) use of anticoagulants: Secondary | ICD-10-CM | POA: Diagnosis not present

## 2022-08-17 DIAGNOSIS — E46 Unspecified protein-calorie malnutrition: Secondary | ICD-10-CM | POA: Diagnosis not present

## 2022-08-17 DIAGNOSIS — B9689 Other specified bacterial agents as the cause of diseases classified elsewhere: Secondary | ICD-10-CM | POA: Diagnosis not present

## 2022-08-17 DIAGNOSIS — I872 Venous insufficiency (chronic) (peripheral): Secondary | ICD-10-CM | POA: Diagnosis not present

## 2022-08-17 DIAGNOSIS — Z9181 History of falling: Secondary | ICD-10-CM | POA: Diagnosis not present

## 2022-08-17 DIAGNOSIS — K219 Gastro-esophageal reflux disease without esophagitis: Secondary | ICD-10-CM | POA: Diagnosis not present

## 2022-08-17 DIAGNOSIS — Z7901 Long term (current) use of anticoagulants: Secondary | ICD-10-CM | POA: Diagnosis not present

## 2022-08-17 DIAGNOSIS — E039 Hypothyroidism, unspecified: Secondary | ICD-10-CM | POA: Diagnosis not present

## 2022-08-17 DIAGNOSIS — J208 Acute bronchitis due to other specified organisms: Secondary | ICD-10-CM | POA: Diagnosis not present

## 2022-08-17 DIAGNOSIS — I739 Peripheral vascular disease, unspecified: Secondary | ICD-10-CM | POA: Diagnosis not present

## 2022-08-17 DIAGNOSIS — I10 Essential (primary) hypertension: Secondary | ICD-10-CM | POA: Diagnosis not present

## 2022-08-17 DIAGNOSIS — K5900 Constipation, unspecified: Secondary | ICD-10-CM | POA: Diagnosis not present

## 2022-08-17 DIAGNOSIS — M5116 Intervertebral disc disorders with radiculopathy, lumbar region: Secondary | ICD-10-CM | POA: Diagnosis not present

## 2022-08-17 DIAGNOSIS — M8580 Other specified disorders of bone density and structure, unspecified site: Secondary | ICD-10-CM | POA: Diagnosis not present

## 2022-08-17 DIAGNOSIS — I480 Paroxysmal atrial fibrillation: Secondary | ICD-10-CM | POA: Diagnosis not present

## 2022-08-17 DIAGNOSIS — D469 Myelodysplastic syndrome, unspecified: Secondary | ICD-10-CM | POA: Diagnosis not present

## 2022-08-17 DIAGNOSIS — M4726 Other spondylosis with radiculopathy, lumbar region: Secondary | ICD-10-CM | POA: Diagnosis not present

## 2022-08-17 DIAGNOSIS — Z79891 Long term (current) use of opiate analgesic: Secondary | ICD-10-CM | POA: Diagnosis not present

## 2022-08-17 DIAGNOSIS — Z556 Problems related to health literacy: Secondary | ICD-10-CM | POA: Diagnosis not present

## 2022-08-17 DIAGNOSIS — M80052D Age-related osteoporosis with current pathological fracture, left femur, subsequent encounter for fracture with routine healing: Secondary | ICD-10-CM | POA: Diagnosis not present

## 2022-08-17 DIAGNOSIS — Z86718 Personal history of other venous thrombosis and embolism: Secondary | ICD-10-CM | POA: Diagnosis not present

## 2022-08-17 DIAGNOSIS — Z950 Presence of cardiac pacemaker: Secondary | ICD-10-CM | POA: Diagnosis not present

## 2022-08-17 DIAGNOSIS — K580 Irritable bowel syndrome with diarrhea: Secondary | ICD-10-CM | POA: Diagnosis not present

## 2022-08-17 DIAGNOSIS — E785 Hyperlipidemia, unspecified: Secondary | ICD-10-CM | POA: Diagnosis not present

## 2022-08-17 DIAGNOSIS — D62 Acute posthemorrhagic anemia: Secondary | ICD-10-CM | POA: Diagnosis not present

## 2022-08-17 DIAGNOSIS — N390 Urinary tract infection, site not specified: Secondary | ICD-10-CM | POA: Diagnosis not present

## 2022-08-17 NOTE — Progress Notes (Unsigned)
[facility]  [address] [place],  [place]  [address] [phone]  Clinic Day:  08/18/2022  Referring physician: [name], MD  HISTORY OF PRESENT ILLNESS:  The patient is an 88 y.o. 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  Her bone marrow biopsy revealed that over 15% of her erythroid precursors are ring sideroblasts, which highlights the myelodysplastic features of her bone marrow.  Furthermore, additional studies showed that she harbors the JAK2 mutation; she also has a high white count.  These findings highlight the myeloproliferative component of her bone marrow. She also had a positive M spike and an elevated IgM level, but there were no bone marrow findings to suggest lymphoplasmacytic lymphoma was present.  She comes in today to reassess her peripheral counts.  She currently takes Luspatercept every 3 weeks to improve her anemia.  She currently takes hydroxyurea 500 mg daily, with the goal being to keep her white blood cell count less than 20.  Since her last visit, the patient has been doing okay. Of note, she was hospitalized in late January 2023 after falling and fracturing her hip.  She recalls feeling very weak, which contributed to her falling.  At the time she was admitted, her hemoglobin was low at 5.3, which led to her being transfused 3 units of blood.    PHYSICAL EXAM:  Blood pressure (!) 187/81, pulse 79, temperature (!) 97.5 F (36.4 C), resp. rate 16, height 5\' 3"  (1.6 m), weight 143 lb 3.2 oz (65 kg), SpO2 98 %. Wt Readings from Last 3 Encounters:  08/18/22 143 lb 3.2 oz (65 kg)  06/12/22 135 lb 1.3 oz (61.3 kg)  05/22/22 144 lb (65.3 kg)   Body mass index is 25.37 kg/m. Performance status (ECOG): 1 - Symptomatic but completely ambulatory Physical Exam Constitutional:      Appearance: Normal appearance. She is not ill-appearing.     Comments: She is  ambulating with a walker.  She does not look particularly weak  HENT:     Mouth/Throat:     Mouth: Mucous membranes are moist.     Pharynx: Oropharynx is clear. No oropharyngeal exudate or posterior oropharyngeal erythema.  Cardiovascular:     Rate and Rhythm: Normal rate and regular rhythm.     Heart sounds: No murmur heard.    No friction rub. No gallop.  Pulmonary:     Effort: Pulmonary effort is normal. No respiratory distress.     Breath sounds: Normal breath sounds. No wheezing, rhonchi or rales.  Abdominal:     General: Bowel sounds are normal. There is no distension.     Palpations: Abdomen is soft. There is no mass.     Tenderness: There is no abdominal tenderness.  Musculoskeletal:        General: No swelling.     Right lower leg: No edema.     Left lower leg: No edema.  Lymphadenopathy:     Cervical: No cervical adenopathy.     Upper Body:     Right upper body: No supraclavicular or axillary adenopathy.     Left upper body: No supraclavicular or axillary adenopathy.     Lower Body: No right inguinal adenopathy. No left inguinal adenopathy.  Skin:    General: Skin is warm.     Coloration: Skin is not jaundiced.     Findings: No lesion or rash.  Neurological:     General: No focal  deficit present.     Mental Status: She is alert and oriented to person, place, and time. Mental status is at baseline.  Psychiatric:        Mood and Affect: Mood normal.        Behavior: Behavior normal.        Thought Content: Thought content normal.    LABS:    ASSESSMENT & PLAN:  An 88 y.o. female who has myelodysplastic/myeloproliferative disease.  When evaluating her labs today, her white count remains is now above 30.  Based upon this, I will increase her hydroxyurea to 500 mg BID.   I am pleased with the steady improvement in her hemoglobin, which reflects the efficacy of Luspatercept.  Although she was transfused 3 units of blood in late January 2024, CBCs since then have shown a  persistently rising hemoglobin, which can be attributed to the Luspatercept she has been receiving every 3 weeks.  This medication will continue to be given at this frequency.  Her CBC will continue to be checked every 3 weeks to see how well she is responding to her therapies.  I will see her back in another 12 weeks for repeat clinical assessment.  The patient understands all the plans discussed today and is in agreement with them.   Gaither Biehn Macarthur Critchley, MD

## 2022-08-18 ENCOUNTER — Other Ambulatory Visit: Payer: Self-pay | Admitting: Oncology

## 2022-08-18 ENCOUNTER — Inpatient Hospital Stay: Payer: Medicare Other | Attending: Oncology

## 2022-08-18 ENCOUNTER — Inpatient Hospital Stay (HOSPITAL_BASED_OUTPATIENT_CLINIC_OR_DEPARTMENT_OTHER): Payer: Medicare Other | Admitting: Oncology

## 2022-08-18 VITALS — BP 187/81 | HR 79 | Temp 97.5°F | Resp 16 | Ht 63.0 in | Wt 143.2 lb

## 2022-08-18 DIAGNOSIS — D643 Other sideroblastic anemias: Secondary | ICD-10-CM

## 2022-08-18 DIAGNOSIS — D469 Myelodysplastic syndrome, unspecified: Secondary | ICD-10-CM

## 2022-08-18 DIAGNOSIS — D46Z Other myelodysplastic syndromes: Secondary | ICD-10-CM | POA: Insufficient documentation

## 2022-08-18 LAB — CBC AND DIFFERENTIAL
HCT: 31 — AB (ref 36–46)
Hemoglobin: 10.1 — AB (ref 12.0–16.0)
Neutrophils Absolute: 26.66
Platelets: 285 10*3/uL (ref 150–400)
WBC: 30.3

## 2022-08-18 LAB — CBC: RBC: 2.91 — AB (ref 3.87–5.11)

## 2022-08-19 ENCOUNTER — Encounter: Payer: Self-pay | Admitting: Oncology

## 2022-08-19 ENCOUNTER — Ambulatory Visit (INDEPENDENT_AMBULATORY_CARE_PROVIDER_SITE_OTHER): Payer: Medicare Other

## 2022-08-19 DIAGNOSIS — I495 Sick sinus syndrome: Secondary | ICD-10-CM | POA: Diagnosis not present

## 2022-08-19 MED FILL — Luspatercept-aamt For Subcutaneous Inj 75 MG: SUBCUTANEOUS | Qty: 1.8 | Status: AC

## 2022-08-20 ENCOUNTER — Inpatient Hospital Stay: Payer: Medicare Other

## 2022-08-20 VITALS — BP 142/72 | HR 68 | Temp 97.7°F | Resp 20 | Wt 142.0 lb

## 2022-08-20 DIAGNOSIS — D643 Other sideroblastic anemias: Secondary | ICD-10-CM

## 2022-08-20 DIAGNOSIS — D46Z Other myelodysplastic syndromes: Secondary | ICD-10-CM | POA: Diagnosis not present

## 2022-08-20 DIAGNOSIS — D469 Myelodysplastic syndrome, unspecified: Secondary | ICD-10-CM

## 2022-08-20 LAB — CUP PACEART REMOTE DEVICE CHECK
Battery Remaining Longevity: 107 mo
Battery Remaining Percentage: 91 %
Battery Voltage: 3.01 V
Brady Statistic AP VP Percent: 1 %
Brady Statistic AP VS Percent: 24 %
Brady Statistic AS VP Percent: 1 %
Brady Statistic AS VS Percent: 75 %
Brady Statistic RA Percent Paced: 23 %
Brady Statistic RV Percent Paced: 1 %
Date Time Interrogation Session: 20240320020016
Implantable Lead Connection Status: 753985
Implantable Lead Connection Status: 753985
Implantable Lead Implant Date: 20220920
Implantable Lead Implant Date: 20220920
Implantable Lead Location: 753859
Implantable Lead Location: 753860
Implantable Pulse Generator Implant Date: 20220920
Lead Channel Impedance Value: 390 Ohm
Lead Channel Impedance Value: 430 Ohm
Lead Channel Pacing Threshold Amplitude: 0.5 V
Lead Channel Pacing Threshold Amplitude: 0.75 V
Lead Channel Pacing Threshold Pulse Width: 0.5 ms
Lead Channel Pacing Threshold Pulse Width: 0.5 ms
Lead Channel Sensing Intrinsic Amplitude: 11.2 mV
Lead Channel Sensing Intrinsic Amplitude: 3.8 mV
Lead Channel Setting Pacing Amplitude: 2 V
Lead Channel Setting Pacing Amplitude: 2.5 V
Lead Channel Setting Pacing Pulse Width: 0.5 ms
Lead Channel Setting Sensing Sensitivity: 2 mV
Pulse Gen Model: 2272
Pulse Gen Serial Number: 3963600

## 2022-08-20 MED ORDER — LUSPATERCEPT-AAMT 75 MG ~~LOC~~ SOLR
1.3300 mg/kg | Freq: Once | SUBCUTANEOUS | Status: AC
  Administered 2022-08-20: 90 mg via SUBCUTANEOUS
  Filled 2022-08-20: qty 1.5

## 2022-08-20 NOTE — Patient Instructions (Signed)
Luspatercept Injection What is this medication? LUSPATERCEPT (lus PAT er sept) treats low levels of red blood cells (anemia) in the body in people with beta thalassemia or myelodysplastic syndromes. It works by helping the body make more red blood cells. This medicine may be used for other purposes; ask your health care provider or pharmacist if you have questions. COMMON BRAND NAME(S): REBLOZYL What should I tell my care team before I take this medication? They need to know if you have any of these conditions: Have had your spleen removed High blood pressure History of blood clots Tobacco use An unusual or allergic reaction to luspatercept, other medications, foods, dyes or preservatives Pregnant or trying to get pregnant Breast-feeding How should I use this medication? This medication is for injection under the skin. It is given by your care team in a hospital or clinic setting. Talk to your care team about the use of the medication in children. This medication is not approved for use in children. Overdosage: If you think you have taken too much of this medicine contact a poison control center or emergency room at once. NOTE: This medicine is only for you. Do not share this medicine with others. What if I miss a dose? Keep appointments for follow-up doses. It is important not to miss your dose. Call your care team if you are unable to keep an appointment. What may interact with this medication? Interactions are not expected. This list may not describe all possible interactions. Give your health care provider a list of all the medicines, herbs, non-prescription drugs, or dietary supplements you use. Also tell them if you smoke, drink alcohol, or use illegal drugs. Some items may interact with your medicine. What should I watch for while using this medication? Your condition will be monitored carefully while you are receiving this medication. Talk to your care team if you wish to become  pregnant or think you might be pregnant. This medication can cause serious birth defects. Discuss contraceptive options with your care team. Do not breastfeed while taking this medication. You may need blood work done while you are taking this medication. What side effects may I notice from receiving this medication? Side effects that you should report to your care team as soon as possible: Allergic reactions--skin rash, itching, hives, swelling of the face, lips, tongue, or throat Blood clot--pain, swelling, or warmth in the leg, shortness of breath, chest pain Increase in blood pressure Severe back pain, numbness or weakness of the hands, arms, legs, or feet, loss of coordination, loss of bowel or bladder control Side effects that usually do not require medical attention (report these to your care team if they continue or are bothersome): Bone pain Dizziness Fatigue Headache Joint pain Muscle pain Stomach pain This list may not describe all possible side effects. Call your doctor for medical advice about side effects. You may report side effects to FDA at 1-800-FDA-1088. Where should I keep my medication? This medication is given in a hospital or clinic. It will not be stored at home. NOTE: This sheet is a summary. It may not cover all possible information. If you have questions about this medicine, talk to your doctor, pharmacist, or health care provider.  2023 Elsevier/Gold Standard (2020-12-13 00:00:00)  

## 2022-08-21 DIAGNOSIS — M8580 Other specified disorders of bone density and structure, unspecified site: Secondary | ICD-10-CM | POA: Diagnosis not present

## 2022-08-21 DIAGNOSIS — M5116 Intervertebral disc disorders with radiculopathy, lumbar region: Secondary | ICD-10-CM | POA: Diagnosis not present

## 2022-08-21 DIAGNOSIS — I10 Essential (primary) hypertension: Secondary | ICD-10-CM | POA: Diagnosis not present

## 2022-08-21 DIAGNOSIS — E46 Unspecified protein-calorie malnutrition: Secondary | ICD-10-CM | POA: Diagnosis not present

## 2022-08-21 DIAGNOSIS — Z7901 Long term (current) use of anticoagulants: Secondary | ICD-10-CM | POA: Diagnosis not present

## 2022-08-21 DIAGNOSIS — Z950 Presence of cardiac pacemaker: Secondary | ICD-10-CM | POA: Diagnosis not present

## 2022-08-21 DIAGNOSIS — I739 Peripheral vascular disease, unspecified: Secondary | ICD-10-CM | POA: Diagnosis not present

## 2022-08-21 DIAGNOSIS — M80052D Age-related osteoporosis with current pathological fracture, left femur, subsequent encounter for fracture with routine healing: Secondary | ICD-10-CM | POA: Diagnosis not present

## 2022-08-21 DIAGNOSIS — N390 Urinary tract infection, site not specified: Secondary | ICD-10-CM | POA: Diagnosis not present

## 2022-08-21 DIAGNOSIS — B9689 Other specified bacterial agents as the cause of diseases classified elsewhere: Secondary | ICD-10-CM | POA: Diagnosis not present

## 2022-08-21 DIAGNOSIS — Z9181 History of falling: Secondary | ICD-10-CM | POA: Diagnosis not present

## 2022-08-21 DIAGNOSIS — I480 Paroxysmal atrial fibrillation: Secondary | ICD-10-CM | POA: Diagnosis not present

## 2022-08-21 DIAGNOSIS — E785 Hyperlipidemia, unspecified: Secondary | ICD-10-CM | POA: Diagnosis not present

## 2022-08-21 DIAGNOSIS — K5900 Constipation, unspecified: Secondary | ICD-10-CM | POA: Diagnosis not present

## 2022-08-21 DIAGNOSIS — K219 Gastro-esophageal reflux disease without esophagitis: Secondary | ICD-10-CM | POA: Diagnosis not present

## 2022-08-21 DIAGNOSIS — Z79891 Long term (current) use of opiate analgesic: Secondary | ICD-10-CM | POA: Diagnosis not present

## 2022-08-21 DIAGNOSIS — I872 Venous insufficiency (chronic) (peripheral): Secondary | ICD-10-CM | POA: Diagnosis not present

## 2022-08-21 DIAGNOSIS — Z86718 Personal history of other venous thrombosis and embolism: Secondary | ICD-10-CM | POA: Diagnosis not present

## 2022-08-21 DIAGNOSIS — J208 Acute bronchitis due to other specified organisms: Secondary | ICD-10-CM | POA: Diagnosis not present

## 2022-08-21 DIAGNOSIS — M4726 Other spondylosis with radiculopathy, lumbar region: Secondary | ICD-10-CM | POA: Diagnosis not present

## 2022-08-21 DIAGNOSIS — Z556 Problems related to health literacy: Secondary | ICD-10-CM | POA: Diagnosis not present

## 2022-08-21 DIAGNOSIS — K580 Irritable bowel syndrome with diarrhea: Secondary | ICD-10-CM | POA: Diagnosis not present

## 2022-08-21 DIAGNOSIS — E039 Hypothyroidism, unspecified: Secondary | ICD-10-CM | POA: Diagnosis not present

## 2022-08-21 DIAGNOSIS — D469 Myelodysplastic syndrome, unspecified: Secondary | ICD-10-CM | POA: Diagnosis not present

## 2022-08-21 DIAGNOSIS — D62 Acute posthemorrhagic anemia: Secondary | ICD-10-CM | POA: Diagnosis not present

## 2022-08-26 DIAGNOSIS — I480 Paroxysmal atrial fibrillation: Secondary | ICD-10-CM | POA: Diagnosis not present

## 2022-08-26 DIAGNOSIS — Z79891 Long term (current) use of opiate analgesic: Secondary | ICD-10-CM | POA: Diagnosis not present

## 2022-08-26 DIAGNOSIS — E039 Hypothyroidism, unspecified: Secondary | ICD-10-CM | POA: Diagnosis not present

## 2022-08-26 DIAGNOSIS — I10 Essential (primary) hypertension: Secondary | ICD-10-CM | POA: Diagnosis not present

## 2022-08-26 DIAGNOSIS — K5900 Constipation, unspecified: Secondary | ICD-10-CM | POA: Diagnosis not present

## 2022-08-26 DIAGNOSIS — M80052D Age-related osteoporosis with current pathological fracture, left femur, subsequent encounter for fracture with routine healing: Secondary | ICD-10-CM | POA: Diagnosis not present

## 2022-08-26 DIAGNOSIS — Z950 Presence of cardiac pacemaker: Secondary | ICD-10-CM | POA: Diagnosis not present

## 2022-08-26 DIAGNOSIS — D469 Myelodysplastic syndrome, unspecified: Secondary | ICD-10-CM | POA: Diagnosis not present

## 2022-08-26 DIAGNOSIS — E46 Unspecified protein-calorie malnutrition: Secondary | ICD-10-CM | POA: Diagnosis not present

## 2022-08-26 DIAGNOSIS — J208 Acute bronchitis due to other specified organisms: Secondary | ICD-10-CM | POA: Diagnosis not present

## 2022-08-26 DIAGNOSIS — B9689 Other specified bacterial agents as the cause of diseases classified elsewhere: Secondary | ICD-10-CM | POA: Diagnosis not present

## 2022-08-26 DIAGNOSIS — M4726 Other spondylosis with radiculopathy, lumbar region: Secondary | ICD-10-CM | POA: Diagnosis not present

## 2022-08-26 DIAGNOSIS — M5116 Intervertebral disc disorders with radiculopathy, lumbar region: Secondary | ICD-10-CM | POA: Diagnosis not present

## 2022-08-26 DIAGNOSIS — N390 Urinary tract infection, site not specified: Secondary | ICD-10-CM | POA: Diagnosis not present

## 2022-08-26 DIAGNOSIS — I739 Peripheral vascular disease, unspecified: Secondary | ICD-10-CM | POA: Diagnosis not present

## 2022-08-26 DIAGNOSIS — M8580 Other specified disorders of bone density and structure, unspecified site: Secondary | ICD-10-CM | POA: Diagnosis not present

## 2022-08-26 DIAGNOSIS — K580 Irritable bowel syndrome with diarrhea: Secondary | ICD-10-CM | POA: Diagnosis not present

## 2022-08-26 DIAGNOSIS — Z556 Problems related to health literacy: Secondary | ICD-10-CM | POA: Diagnosis not present

## 2022-08-26 DIAGNOSIS — Z9181 History of falling: Secondary | ICD-10-CM | POA: Diagnosis not present

## 2022-08-26 DIAGNOSIS — Z7901 Long term (current) use of anticoagulants: Secondary | ICD-10-CM | POA: Diagnosis not present

## 2022-08-26 DIAGNOSIS — D62 Acute posthemorrhagic anemia: Secondary | ICD-10-CM | POA: Diagnosis not present

## 2022-08-26 DIAGNOSIS — E785 Hyperlipidemia, unspecified: Secondary | ICD-10-CM | POA: Diagnosis not present

## 2022-08-26 DIAGNOSIS — K219 Gastro-esophageal reflux disease without esophagitis: Secondary | ICD-10-CM | POA: Diagnosis not present

## 2022-08-26 DIAGNOSIS — Z86718 Personal history of other venous thrombosis and embolism: Secondary | ICD-10-CM | POA: Diagnosis not present

## 2022-08-26 DIAGNOSIS — I872 Venous insufficiency (chronic) (peripheral): Secondary | ICD-10-CM | POA: Diagnosis not present

## 2022-09-01 ENCOUNTER — Encounter: Payer: Self-pay | Admitting: Oncology

## 2022-09-02 DIAGNOSIS — K5900 Constipation, unspecified: Secondary | ICD-10-CM | POA: Diagnosis not present

## 2022-09-02 DIAGNOSIS — E039 Hypothyroidism, unspecified: Secondary | ICD-10-CM | POA: Diagnosis not present

## 2022-09-02 DIAGNOSIS — E46 Unspecified protein-calorie malnutrition: Secondary | ICD-10-CM | POA: Diagnosis not present

## 2022-09-02 DIAGNOSIS — Z79891 Long term (current) use of opiate analgesic: Secondary | ICD-10-CM | POA: Diagnosis not present

## 2022-09-02 DIAGNOSIS — I480 Paroxysmal atrial fibrillation: Secondary | ICD-10-CM | POA: Diagnosis not present

## 2022-09-02 DIAGNOSIS — D469 Myelodysplastic syndrome, unspecified: Secondary | ICD-10-CM | POA: Diagnosis not present

## 2022-09-02 DIAGNOSIS — N390 Urinary tract infection, site not specified: Secondary | ICD-10-CM | POA: Diagnosis not present

## 2022-09-02 DIAGNOSIS — D62 Acute posthemorrhagic anemia: Secondary | ICD-10-CM | POA: Diagnosis not present

## 2022-09-02 DIAGNOSIS — Z556 Problems related to health literacy: Secondary | ICD-10-CM | POA: Diagnosis not present

## 2022-09-02 DIAGNOSIS — K219 Gastro-esophageal reflux disease without esophagitis: Secondary | ICD-10-CM | POA: Diagnosis not present

## 2022-09-02 DIAGNOSIS — I739 Peripheral vascular disease, unspecified: Secondary | ICD-10-CM | POA: Diagnosis not present

## 2022-09-02 DIAGNOSIS — K580 Irritable bowel syndrome with diarrhea: Secondary | ICD-10-CM | POA: Diagnosis not present

## 2022-09-02 DIAGNOSIS — J208 Acute bronchitis due to other specified organisms: Secondary | ICD-10-CM | POA: Diagnosis not present

## 2022-09-02 DIAGNOSIS — I10 Essential (primary) hypertension: Secondary | ICD-10-CM | POA: Diagnosis not present

## 2022-09-02 DIAGNOSIS — M8580 Other specified disorders of bone density and structure, unspecified site: Secondary | ICD-10-CM | POA: Diagnosis not present

## 2022-09-02 DIAGNOSIS — M80052D Age-related osteoporosis with current pathological fracture, left femur, subsequent encounter for fracture with routine healing: Secondary | ICD-10-CM | POA: Diagnosis not present

## 2022-09-02 DIAGNOSIS — I872 Venous insufficiency (chronic) (peripheral): Secondary | ICD-10-CM | POA: Diagnosis not present

## 2022-09-02 DIAGNOSIS — B9689 Other specified bacterial agents as the cause of diseases classified elsewhere: Secondary | ICD-10-CM | POA: Diagnosis not present

## 2022-09-02 DIAGNOSIS — M5116 Intervertebral disc disorders with radiculopathy, lumbar region: Secondary | ICD-10-CM | POA: Diagnosis not present

## 2022-09-02 DIAGNOSIS — Z950 Presence of cardiac pacemaker: Secondary | ICD-10-CM | POA: Diagnosis not present

## 2022-09-02 DIAGNOSIS — Z86718 Personal history of other venous thrombosis and embolism: Secondary | ICD-10-CM | POA: Diagnosis not present

## 2022-09-02 DIAGNOSIS — Z7901 Long term (current) use of anticoagulants: Secondary | ICD-10-CM | POA: Diagnosis not present

## 2022-09-02 DIAGNOSIS — E785 Hyperlipidemia, unspecified: Secondary | ICD-10-CM | POA: Diagnosis not present

## 2022-09-02 DIAGNOSIS — Z9181 History of falling: Secondary | ICD-10-CM | POA: Diagnosis not present

## 2022-09-02 DIAGNOSIS — M4726 Other spondylosis with radiculopathy, lumbar region: Secondary | ICD-10-CM | POA: Diagnosis not present

## 2022-09-05 DIAGNOSIS — R300 Dysuria: Secondary | ICD-10-CM | POA: Diagnosis not present

## 2022-09-08 ENCOUNTER — Inpatient Hospital Stay: Payer: Medicare Other | Attending: Oncology

## 2022-09-08 ENCOUNTER — Encounter: Payer: Self-pay | Admitting: Oncology

## 2022-09-08 DIAGNOSIS — Z7901 Long term (current) use of anticoagulants: Secondary | ICD-10-CM | POA: Diagnosis not present

## 2022-09-08 DIAGNOSIS — D469 Myelodysplastic syndrome, unspecified: Secondary | ICD-10-CM

## 2022-09-08 DIAGNOSIS — E785 Hyperlipidemia, unspecified: Secondary | ICD-10-CM | POA: Diagnosis not present

## 2022-09-08 DIAGNOSIS — M80052D Age-related osteoporosis with current pathological fracture, left femur, subsequent encounter for fracture with routine healing: Secondary | ICD-10-CM | POA: Diagnosis not present

## 2022-09-08 DIAGNOSIS — D643 Other sideroblastic anemias: Secondary | ICD-10-CM | POA: Diagnosis not present

## 2022-09-08 DIAGNOSIS — I10 Essential (primary) hypertension: Secondary | ICD-10-CM | POA: Diagnosis not present

## 2022-09-08 DIAGNOSIS — I480 Paroxysmal atrial fibrillation: Secondary | ICD-10-CM | POA: Diagnosis not present

## 2022-09-08 DIAGNOSIS — Z950 Presence of cardiac pacemaker: Secondary | ICD-10-CM | POA: Diagnosis not present

## 2022-09-08 DIAGNOSIS — B9689 Other specified bacterial agents as the cause of diseases classified elsewhere: Secondary | ICD-10-CM | POA: Diagnosis not present

## 2022-09-08 DIAGNOSIS — I872 Venous insufficiency (chronic) (peripheral): Secondary | ICD-10-CM | POA: Diagnosis not present

## 2022-09-08 DIAGNOSIS — Z79891 Long term (current) use of opiate analgesic: Secondary | ICD-10-CM | POA: Diagnosis not present

## 2022-09-08 DIAGNOSIS — Z556 Problems related to health literacy: Secondary | ICD-10-CM | POA: Diagnosis not present

## 2022-09-08 DIAGNOSIS — M5116 Intervertebral disc disorders with radiculopathy, lumbar region: Secondary | ICD-10-CM | POA: Diagnosis not present

## 2022-09-08 DIAGNOSIS — K5900 Constipation, unspecified: Secondary | ICD-10-CM | POA: Diagnosis not present

## 2022-09-08 DIAGNOSIS — N390 Urinary tract infection, site not specified: Secondary | ICD-10-CM | POA: Diagnosis not present

## 2022-09-08 DIAGNOSIS — K580 Irritable bowel syndrome with diarrhea: Secondary | ICD-10-CM | POA: Diagnosis not present

## 2022-09-08 DIAGNOSIS — D62 Acute posthemorrhagic anemia: Secondary | ICD-10-CM | POA: Diagnosis not present

## 2022-09-08 DIAGNOSIS — Z9181 History of falling: Secondary | ICD-10-CM | POA: Diagnosis not present

## 2022-09-08 DIAGNOSIS — K219 Gastro-esophageal reflux disease without esophagitis: Secondary | ICD-10-CM | POA: Diagnosis not present

## 2022-09-08 DIAGNOSIS — E46 Unspecified protein-calorie malnutrition: Secondary | ICD-10-CM | POA: Diagnosis not present

## 2022-09-08 DIAGNOSIS — M4726 Other spondylosis with radiculopathy, lumbar region: Secondary | ICD-10-CM | POA: Diagnosis not present

## 2022-09-08 DIAGNOSIS — Z86718 Personal history of other venous thrombosis and embolism: Secondary | ICD-10-CM | POA: Diagnosis not present

## 2022-09-08 DIAGNOSIS — J208 Acute bronchitis due to other specified organisms: Secondary | ICD-10-CM | POA: Diagnosis not present

## 2022-09-08 DIAGNOSIS — M8580 Other specified disorders of bone density and structure, unspecified site: Secondary | ICD-10-CM | POA: Diagnosis not present

## 2022-09-08 DIAGNOSIS — D461 Refractory anemia with ring sideroblasts: Secondary | ICD-10-CM | POA: Insufficient documentation

## 2022-09-08 DIAGNOSIS — E039 Hypothyroidism, unspecified: Secondary | ICD-10-CM | POA: Diagnosis not present

## 2022-09-08 DIAGNOSIS — I739 Peripheral vascular disease, unspecified: Secondary | ICD-10-CM | POA: Diagnosis not present

## 2022-09-08 LAB — CBC AND DIFFERENTIAL
HCT: 29 — AB (ref 36–46)
Hemoglobin: 9.3 — AB (ref 12.0–16.0)
Neutrophils Absolute: 21.84
Platelets: 266 10*3/uL (ref 150–400)
WBC: 28

## 2022-09-08 LAB — CBC: RBC: 2.66 — AB (ref 3.87–5.11)

## 2022-09-09 MED FILL — Luspatercept-aamt For Subcutaneous Inj 25 MG: SUBCUTANEOUS | Qty: 2.4 | Status: AC

## 2022-09-10 ENCOUNTER — Inpatient Hospital Stay: Payer: Medicare Other

## 2022-09-10 VITALS — BP 139/76 | HR 74 | Temp 97.6°F | Resp 18 | Wt 139.1 lb

## 2022-09-10 DIAGNOSIS — D469 Myelodysplastic syndrome, unspecified: Secondary | ICD-10-CM

## 2022-09-10 DIAGNOSIS — D461 Refractory anemia with ring sideroblasts: Secondary | ICD-10-CM | POA: Diagnosis not present

## 2022-09-10 DIAGNOSIS — D643 Other sideroblastic anemias: Secondary | ICD-10-CM

## 2022-09-10 MED ORDER — LUSPATERCEPT-AAMT 25 MG ~~LOC~~ SOLR
1.7500 mg/kg | Freq: Once | SUBCUTANEOUS | Status: AC
  Administered 2022-09-10: 120 mg via SUBCUTANEOUS
  Filled 2022-09-10: qty 1.5

## 2022-09-10 NOTE — Patient Instructions (Signed)
Luspatercept Injection What is this medication? LUSPATERCEPT (lus PAT er sept) treats low levels of red blood cells (anemia) in the body in people with beta thalassemia or myelodysplastic syndromes. It works by helping the body make more red blood cells. This medicine may be used for other purposes; ask your health care provider or pharmacist if you have questions. COMMON BRAND NAME(S): REBLOZYL What should I tell my care team before I take this medication? They need to know if you have any of these conditions: Have had your spleen removed High blood pressure History of blood clots Tobacco use An unusual or allergic reaction to luspatercept, other medications, foods, dyes or preservatives Pregnant or trying to get pregnant Breast-feeding How should I use this medication? This medication is for injection under the skin. It is given by your care team in a hospital or clinic setting. Talk to your care team about the use of the medication in children. This medication is not approved for use in children. Overdosage: If you think you have taken too much of this medicine contact a poison control center or emergency room at once. NOTE: This medicine is only for you. Do not share this medicine with others. What if I miss a dose? Keep appointments for follow-up doses. It is important not to miss your dose. Call your care team if you are unable to keep an appointment. What may interact with this medication? Interactions are not expected. This list may not describe all possible interactions. Give your health care provider a list of all the medicines, herbs, non-prescription drugs, or dietary supplements you use. Also tell them if you smoke, drink alcohol, or use illegal drugs. Some items may interact with your medicine. What should I watch for while using this medication? Your condition will be monitored carefully while you are receiving this medication. Talk to your care team if you wish to become  pregnant or think you might be pregnant. This medication can cause serious birth defects. Discuss contraceptive options with your care team. Do not breastfeed while taking this medication. You may need blood work done while you are taking this medication. What side effects may I notice from receiving this medication? Side effects that you should report to your care team as soon as possible: Allergic reactions--skin rash, itching, hives, swelling of the face, lips, tongue, or throat Blood clot--pain, swelling, or warmth in the leg, shortness of breath, chest pain Increase in blood pressure Severe back pain, numbness or weakness of the hands, arms, legs, or feet, loss of coordination, loss of bowel or bladder control Side effects that usually do not require medical attention (report these to your care team if they continue or are bothersome): Bone pain Dizziness Fatigue Headache Joint pain Muscle pain Stomach pain This list may not describe all possible side effects. Call your doctor for medical advice about side effects. You may report side effects to FDA at 1-800-FDA-1088. Where should I keep my medication? This medication is given in a hospital or clinic. It will not be stored at home. NOTE: This sheet is a summary. It may not cover all possible information. If you have questions about this medicine, talk to your doctor, pharmacist, or health care provider.  2023 Elsevier/Gold Standard (2020-12-13 00:00:00)  

## 2022-09-14 DIAGNOSIS — S32030A Wedge compression fracture of third lumbar vertebra, initial encounter for closed fracture: Secondary | ICD-10-CM | POA: Diagnosis not present

## 2022-09-18 ENCOUNTER — Other Ambulatory Visit: Payer: Self-pay

## 2022-09-18 DIAGNOSIS — Z7401 Bed confinement status: Secondary | ICD-10-CM | POA: Diagnosis not present

## 2022-09-18 DIAGNOSIS — D649 Anemia, unspecified: Secondary | ICD-10-CM | POA: Diagnosis not present

## 2022-09-18 DIAGNOSIS — I4891 Unspecified atrial fibrillation: Secondary | ICD-10-CM | POA: Diagnosis not present

## 2022-09-18 DIAGNOSIS — D469 Myelodysplastic syndrome, unspecified: Secondary | ICD-10-CM | POA: Diagnosis not present

## 2022-09-18 DIAGNOSIS — W19XXXA Unspecified fall, initial encounter: Secondary | ICD-10-CM | POA: Diagnosis not present

## 2022-09-18 DIAGNOSIS — M6281 Muscle weakness (generalized): Secondary | ICD-10-CM | POA: Diagnosis not present

## 2022-09-18 DIAGNOSIS — S32501A Unspecified fracture of right pubis, initial encounter for closed fracture: Secondary | ICD-10-CM | POA: Diagnosis not present

## 2022-09-18 DIAGNOSIS — Z0389 Encounter for observation for other suspected diseases and conditions ruled out: Secondary | ICD-10-CM | POA: Diagnosis not present

## 2022-09-18 DIAGNOSIS — R54 Age-related physical debility: Secondary | ICD-10-CM | POA: Diagnosis not present

## 2022-09-18 DIAGNOSIS — S32029A Unspecified fracture of second lumbar vertebra, initial encounter for closed fracture: Secondary | ICD-10-CM | POA: Diagnosis not present

## 2022-09-18 DIAGNOSIS — Z043 Encounter for examination and observation following other accident: Secondary | ICD-10-CM | POA: Diagnosis not present

## 2022-09-18 DIAGNOSIS — I10 Essential (primary) hypertension: Secondary | ICD-10-CM | POA: Diagnosis not present

## 2022-09-18 DIAGNOSIS — E871 Hypo-osmolality and hyponatremia: Secondary | ICD-10-CM | POA: Diagnosis not present

## 2022-09-18 DIAGNOSIS — Z86718 Personal history of other venous thrombosis and embolism: Secondary | ICD-10-CM | POA: Diagnosis not present

## 2022-09-18 DIAGNOSIS — R279 Unspecified lack of coordination: Secondary | ICD-10-CM | POA: Diagnosis not present

## 2022-09-18 DIAGNOSIS — R339 Retention of urine, unspecified: Secondary | ICD-10-CM | POA: Diagnosis not present

## 2022-09-18 DIAGNOSIS — E039 Hypothyroidism, unspecified: Secondary | ICD-10-CM | POA: Diagnosis not present

## 2022-09-18 DIAGNOSIS — D339 Benign neoplasm of central nervous system, unspecified: Secondary | ICD-10-CM | POA: Diagnosis not present

## 2022-09-18 DIAGNOSIS — I959 Hypotension, unspecified: Secondary | ICD-10-CM | POA: Diagnosis not present

## 2022-09-18 DIAGNOSIS — M25552 Pain in left hip: Secondary | ICD-10-CM | POA: Diagnosis not present

## 2022-09-18 DIAGNOSIS — S32502A Unspecified fracture of left pubis, initial encounter for closed fracture: Secondary | ICD-10-CM | POA: Diagnosis not present

## 2022-09-18 DIAGNOSIS — Z743 Need for continuous supervision: Secondary | ICD-10-CM | POA: Diagnosis not present

## 2022-09-18 DIAGNOSIS — S32592A Other specified fracture of left pubis, initial encounter for closed fracture: Secondary | ICD-10-CM | POA: Diagnosis not present

## 2022-09-18 DIAGNOSIS — R296 Repeated falls: Secondary | ICD-10-CM | POA: Diagnosis not present

## 2022-09-18 DIAGNOSIS — K5900 Constipation, unspecified: Secondary | ICD-10-CM | POA: Diagnosis not present

## 2022-09-18 DIAGNOSIS — S32599A Other specified fracture of unspecified pubis, initial encounter for closed fracture: Secondary | ICD-10-CM | POA: Diagnosis not present

## 2022-09-18 DIAGNOSIS — D61818 Other pancytopenia: Secondary | ICD-10-CM | POA: Diagnosis not present

## 2022-09-18 DIAGNOSIS — R2689 Other abnormalities of gait and mobility: Secondary | ICD-10-CM | POA: Diagnosis not present

## 2022-09-18 DIAGNOSIS — K219 Gastro-esophageal reflux disease without esophagitis: Secondary | ICD-10-CM | POA: Diagnosis not present

## 2022-09-18 DIAGNOSIS — E46 Unspecified protein-calorie malnutrition: Secondary | ICD-10-CM | POA: Diagnosis not present

## 2022-09-18 DIAGNOSIS — M545 Low back pain, unspecified: Secondary | ICD-10-CM | POA: Diagnosis not present

## 2022-09-18 DIAGNOSIS — M546 Pain in thoracic spine: Secondary | ICD-10-CM | POA: Diagnosis not present

## 2022-09-18 DIAGNOSIS — M549 Dorsalgia, unspecified: Secondary | ICD-10-CM | POA: Diagnosis not present

## 2022-09-18 DIAGNOSIS — Z7901 Long term (current) use of anticoagulants: Secondary | ICD-10-CM | POA: Diagnosis not present

## 2022-09-18 DIAGNOSIS — Z79899 Other long term (current) drug therapy: Secondary | ICD-10-CM | POA: Diagnosis not present

## 2022-09-18 DIAGNOSIS — Z950 Presence of cardiac pacemaker: Secondary | ICD-10-CM | POA: Diagnosis not present

## 2022-09-18 DIAGNOSIS — Z66 Do not resuscitate: Secondary | ICD-10-CM | POA: Diagnosis not present

## 2022-09-18 DIAGNOSIS — S22080A Wedge compression fracture of T11-T12 vertebra, initial encounter for closed fracture: Secondary | ICD-10-CM | POA: Diagnosis not present

## 2022-09-18 DIAGNOSIS — R2681 Unsteadiness on feet: Secondary | ICD-10-CM | POA: Diagnosis not present

## 2022-09-18 DIAGNOSIS — I480 Paroxysmal atrial fibrillation: Secondary | ICD-10-CM | POA: Diagnosis not present

## 2022-09-18 DIAGNOSIS — W19XXXD Unspecified fall, subsequent encounter: Secondary | ICD-10-CM | POA: Diagnosis not present

## 2022-09-18 DIAGNOSIS — I495 Sick sinus syndrome: Secondary | ICD-10-CM | POA: Diagnosis not present

## 2022-09-18 DIAGNOSIS — S32592D Other specified fracture of left pubis, subsequent encounter for fracture with routine healing: Secondary | ICD-10-CM | POA: Diagnosis not present

## 2022-09-18 DIAGNOSIS — M800B2D Age-related osteoporosis with current pathological fracture, left pelvis, subsequent encounter for fracture with routine healing: Secondary | ICD-10-CM | POA: Diagnosis not present

## 2022-09-21 ENCOUNTER — Ambulatory Visit: Payer: Medicare Other | Admitting: Cardiology

## 2022-09-21 DIAGNOSIS — D469 Myelodysplastic syndrome, unspecified: Secondary | ICD-10-CM

## 2022-09-24 DIAGNOSIS — I4891 Unspecified atrial fibrillation: Secondary | ICD-10-CM | POA: Diagnosis not present

## 2022-09-24 NOTE — Progress Notes (Signed)
Remote pacemaker transmission.   

## 2022-09-25 DIAGNOSIS — D469 Myelodysplastic syndrome, unspecified: Secondary | ICD-10-CM

## 2022-09-25 DIAGNOSIS — I4891 Unspecified atrial fibrillation: Secondary | ICD-10-CM

## 2022-09-25 DIAGNOSIS — Z950 Presence of cardiac pacemaker: Secondary | ICD-10-CM

## 2022-09-25 DIAGNOSIS — I959 Hypotension, unspecified: Secondary | ICD-10-CM

## 2022-09-25 DIAGNOSIS — I495 Sick sinus syndrome: Secondary | ICD-10-CM

## 2022-09-26 DIAGNOSIS — R54 Age-related physical debility: Secondary | ICD-10-CM | POA: Diagnosis not present

## 2022-09-26 DIAGNOSIS — E46 Unspecified protein-calorie malnutrition: Secondary | ICD-10-CM | POA: Diagnosis not present

## 2022-09-26 DIAGNOSIS — I959 Hypotension, unspecified: Secondary | ICD-10-CM | POA: Diagnosis not present

## 2022-09-26 DIAGNOSIS — I495 Sick sinus syndrome: Secondary | ICD-10-CM | POA: Diagnosis not present

## 2022-09-26 DIAGNOSIS — W19XXXA Unspecified fall, initial encounter: Secondary | ICD-10-CM | POA: Diagnosis not present

## 2022-09-26 DIAGNOSIS — S32592D Other specified fracture of left pubis, subsequent encounter for fracture with routine healing: Secondary | ICD-10-CM | POA: Diagnosis not present

## 2022-09-26 DIAGNOSIS — R279 Unspecified lack of coordination: Secondary | ICD-10-CM | POA: Diagnosis not present

## 2022-09-26 DIAGNOSIS — R2689 Other abnormalities of gait and mobility: Secondary | ICD-10-CM | POA: Diagnosis not present

## 2022-09-26 DIAGNOSIS — R2681 Unsteadiness on feet: Secondary | ICD-10-CM | POA: Diagnosis not present

## 2022-09-26 DIAGNOSIS — K219 Gastro-esophageal reflux disease without esophagitis: Secondary | ICD-10-CM | POA: Diagnosis not present

## 2022-09-26 DIAGNOSIS — Z950 Presence of cardiac pacemaker: Secondary | ICD-10-CM | POA: Diagnosis not present

## 2022-09-26 DIAGNOSIS — Z7189 Other specified counseling: Secondary | ICD-10-CM | POA: Diagnosis not present

## 2022-09-26 DIAGNOSIS — E039 Hypothyroidism, unspecified: Secondary | ICD-10-CM | POA: Diagnosis not present

## 2022-09-26 DIAGNOSIS — M6281 Muscle weakness (generalized): Secondary | ICD-10-CM | POA: Diagnosis not present

## 2022-09-26 DIAGNOSIS — W19XXXD Unspecified fall, subsequent encounter: Secondary | ICD-10-CM | POA: Diagnosis not present

## 2022-09-26 DIAGNOSIS — D649 Anemia, unspecified: Secondary | ICD-10-CM | POA: Diagnosis not present

## 2022-09-26 DIAGNOSIS — R339 Retention of urine, unspecified: Secondary | ICD-10-CM | POA: Diagnosis not present

## 2022-09-26 DIAGNOSIS — M545 Low back pain, unspecified: Secondary | ICD-10-CM | POA: Diagnosis not present

## 2022-09-26 DIAGNOSIS — I10 Essential (primary) hypertension: Secondary | ICD-10-CM | POA: Diagnosis not present

## 2022-09-26 DIAGNOSIS — M800B2D Age-related osteoporosis with current pathological fracture, left pelvis, subsequent encounter for fracture with routine healing: Secondary | ICD-10-CM | POA: Diagnosis not present

## 2022-09-26 DIAGNOSIS — G893 Neoplasm related pain (acute) (chronic): Secondary | ICD-10-CM | POA: Diagnosis not present

## 2022-09-26 DIAGNOSIS — D469 Myelodysplastic syndrome, unspecified: Secondary | ICD-10-CM | POA: Diagnosis not present

## 2022-09-26 DIAGNOSIS — E0590 Thyrotoxicosis, unspecified without thyrotoxic crisis or storm: Secondary | ICD-10-CM | POA: Diagnosis not present

## 2022-09-26 DIAGNOSIS — R5381 Other malaise: Secondary | ICD-10-CM | POA: Diagnosis not present

## 2022-09-26 DIAGNOSIS — Z743 Need for continuous supervision: Secondary | ICD-10-CM | POA: Diagnosis not present

## 2022-09-26 DIAGNOSIS — D61818 Other pancytopenia: Secondary | ICD-10-CM | POA: Diagnosis not present

## 2022-09-26 DIAGNOSIS — R410 Disorientation, unspecified: Secondary | ICD-10-CM | POA: Diagnosis not present

## 2022-09-26 DIAGNOSIS — I4891 Unspecified atrial fibrillation: Secondary | ICD-10-CM | POA: Diagnosis not present

## 2022-09-26 DIAGNOSIS — M810 Age-related osteoporosis without current pathological fracture: Secondary | ICD-10-CM | POA: Diagnosis not present

## 2022-09-26 DIAGNOSIS — Z7401 Bed confinement status: Secondary | ICD-10-CM | POA: Diagnosis not present

## 2022-09-26 DIAGNOSIS — R531 Weakness: Secondary | ICD-10-CM | POA: Diagnosis not present

## 2022-09-28 ENCOUNTER — Telehealth: Payer: Self-pay | Admitting: Oncology

## 2022-09-28 ENCOUNTER — Encounter: Payer: Self-pay | Admitting: Oncology

## 2022-09-28 DIAGNOSIS — R5381 Other malaise: Secondary | ICD-10-CM | POA: Diagnosis not present

## 2022-09-28 DIAGNOSIS — D649 Anemia, unspecified: Secondary | ICD-10-CM | POA: Diagnosis not present

## 2022-09-28 DIAGNOSIS — M810 Age-related osteoporosis without current pathological fracture: Secondary | ICD-10-CM | POA: Diagnosis not present

## 2022-09-28 DIAGNOSIS — I4891 Unspecified atrial fibrillation: Secondary | ICD-10-CM | POA: Diagnosis not present

## 2022-09-28 NOTE — Telephone Encounter (Signed)
09/28/22 Patients daughter called and cancelled all appts.Daughter stated Dr Melvyn Neth is aware.

## 2022-09-29 ENCOUNTER — Inpatient Hospital Stay: Payer: Medicare Other

## 2022-10-01 ENCOUNTER — Encounter: Payer: Self-pay | Admitting: Internal Medicine

## 2022-10-01 ENCOUNTER — Ambulatory Visit: Payer: Medicare Other

## 2022-10-05 DIAGNOSIS — R531 Weakness: Secondary | ICD-10-CM | POA: Diagnosis not present

## 2022-10-05 DIAGNOSIS — G893 Neoplasm related pain (acute) (chronic): Secondary | ICD-10-CM | POA: Diagnosis not present

## 2022-10-05 DIAGNOSIS — D649 Anemia, unspecified: Secondary | ICD-10-CM | POA: Diagnosis not present

## 2022-10-05 DIAGNOSIS — D469 Myelodysplastic syndrome, unspecified: Secondary | ICD-10-CM | POA: Diagnosis not present

## 2022-10-05 DIAGNOSIS — R410 Disorientation, unspecified: Secondary | ICD-10-CM | POA: Diagnosis not present

## 2022-10-05 DIAGNOSIS — Z7401 Bed confinement status: Secondary | ICD-10-CM | POA: Diagnosis not present

## 2022-10-05 DIAGNOSIS — Z7189 Other specified counseling: Secondary | ICD-10-CM | POA: Diagnosis not present

## 2022-10-05 DIAGNOSIS — Z743 Need for continuous supervision: Secondary | ICD-10-CM | POA: Diagnosis not present

## 2022-10-09 ENCOUNTER — Telehealth: Payer: Self-pay

## 2022-10-09 NOTE — Telephone Encounter (Signed)
Entered in error

## 2022-10-14 DIAGNOSIS — D649 Anemia, unspecified: Secondary | ICD-10-CM | POA: Diagnosis not present

## 2022-10-20 ENCOUNTER — Other Ambulatory Visit: Payer: Medicare Other

## 2022-10-22 ENCOUNTER — Ambulatory Visit: Payer: Medicare Other

## 2022-10-22 DIAGNOSIS — J9601 Acute respiratory failure with hypoxia: Secondary | ICD-10-CM | POA: Diagnosis not present

## 2022-10-22 DIAGNOSIS — E441 Mild protein-calorie malnutrition: Secondary | ICD-10-CM | POA: Diagnosis not present

## 2022-10-22 DIAGNOSIS — D469 Myelodysplastic syndrome, unspecified: Secondary | ICD-10-CM | POA: Diagnosis not present

## 2022-10-22 DIAGNOSIS — I4891 Unspecified atrial fibrillation: Secondary | ICD-10-CM | POA: Diagnosis not present

## 2022-11-10 ENCOUNTER — Ambulatory Visit: Payer: Medicare Other | Admitting: Oncology

## 2022-11-10 ENCOUNTER — Other Ambulatory Visit: Payer: Medicare Other

## 2022-11-12 ENCOUNTER — Ambulatory Visit: Payer: Medicare Other

## 2022-11-18 ENCOUNTER — Ambulatory Visit (INDEPENDENT_AMBULATORY_CARE_PROVIDER_SITE_OTHER): Payer: Medicare Other

## 2022-11-18 DIAGNOSIS — I495 Sick sinus syndrome: Secondary | ICD-10-CM

## 2022-11-18 LAB — CUP PACEART REMOTE DEVICE CHECK
Battery Remaining Longevity: 105 mo
Battery Remaining Percentage: 89 %
Battery Voltage: 3.01 V
Brady Statistic AP VP Percent: 1 %
Brady Statistic AP VS Percent: 23 %
Brady Statistic AS VP Percent: 1 %
Brady Statistic AS VS Percent: 76 %
Brady Statistic RA Percent Paced: 22 %
Brady Statistic RV Percent Paced: 1 %
Date Time Interrogation Session: 20240619030625
Implantable Lead Connection Status: 753985
Implantable Lead Connection Status: 753985
Implantable Lead Implant Date: 20220920
Implantable Lead Implant Date: 20220920
Implantable Lead Location: 753859
Implantable Lead Location: 753860
Implantable Pulse Generator Implant Date: 20220920
Lead Channel Impedance Value: 400 Ohm
Lead Channel Impedance Value: 430 Ohm
Lead Channel Pacing Threshold Amplitude: 0.75 V
Lead Channel Pacing Threshold Amplitude: 1 V
Lead Channel Pacing Threshold Pulse Width: 0.5 ms
Lead Channel Pacing Threshold Pulse Width: 0.5 ms
Lead Channel Sensing Intrinsic Amplitude: 10 mV
Lead Channel Sensing Intrinsic Amplitude: 2.3 mV
Lead Channel Setting Pacing Amplitude: 2 V
Lead Channel Setting Pacing Amplitude: 2.5 V
Lead Channel Setting Pacing Pulse Width: 0.5 ms
Lead Channel Setting Sensing Sensitivity: 2 mV
Pulse Gen Model: 2272
Pulse Gen Serial Number: 3963600

## 2022-11-20 DIAGNOSIS — D649 Anemia, unspecified: Secondary | ICD-10-CM | POA: Diagnosis not present

## 2022-11-20 DIAGNOSIS — E441 Mild protein-calorie malnutrition: Secondary | ICD-10-CM | POA: Diagnosis not present

## 2022-11-20 DIAGNOSIS — R5381 Other malaise: Secondary | ICD-10-CM | POA: Diagnosis not present

## 2022-11-20 DIAGNOSIS — M2550 Pain in unspecified joint: Secondary | ICD-10-CM | POA: Diagnosis not present

## 2022-12-15 DIAGNOSIS — D649 Anemia, unspecified: Secondary | ICD-10-CM | POA: Diagnosis not present

## 2022-12-15 DIAGNOSIS — J9611 Chronic respiratory failure with hypoxia: Secondary | ICD-10-CM | POA: Diagnosis not present

## 2022-12-15 DIAGNOSIS — C9291 Myeloid leukemia, unspecified in remission: Secondary | ICD-10-CM | POA: Diagnosis not present

## 2022-12-15 DIAGNOSIS — E441 Mild protein-calorie malnutrition: Secondary | ICD-10-CM | POA: Diagnosis not present

## 2022-12-18 ENCOUNTER — Encounter: Payer: Self-pay | Admitting: Oncology

## 2022-12-18 ENCOUNTER — Other Ambulatory Visit: Payer: Self-pay | Admitting: Pharmacist

## 2023-02-03 ENCOUNTER — Encounter: Payer: Self-pay | Admitting: Oncology

## 2023-02-04 ENCOUNTER — Encounter: Payer: Self-pay | Admitting: Oncology

## 2023-02-23 NOTE — Progress Notes (Signed)
Remote pacemaker transmission.   

## 2023-03-02 DEATH — deceased

## 2023-06-30 IMAGING — DX DG CHEST 1V PORT
1 series · 1 of 1 positions shown · non-contrast
Comparison: Chest radiograph 08/29/2018.

CLINICAL DATA: 86-year-old female with bradycardia and chest pain.

EXAM:
PORTABLE CHEST 1 VIEW

[chest ap]
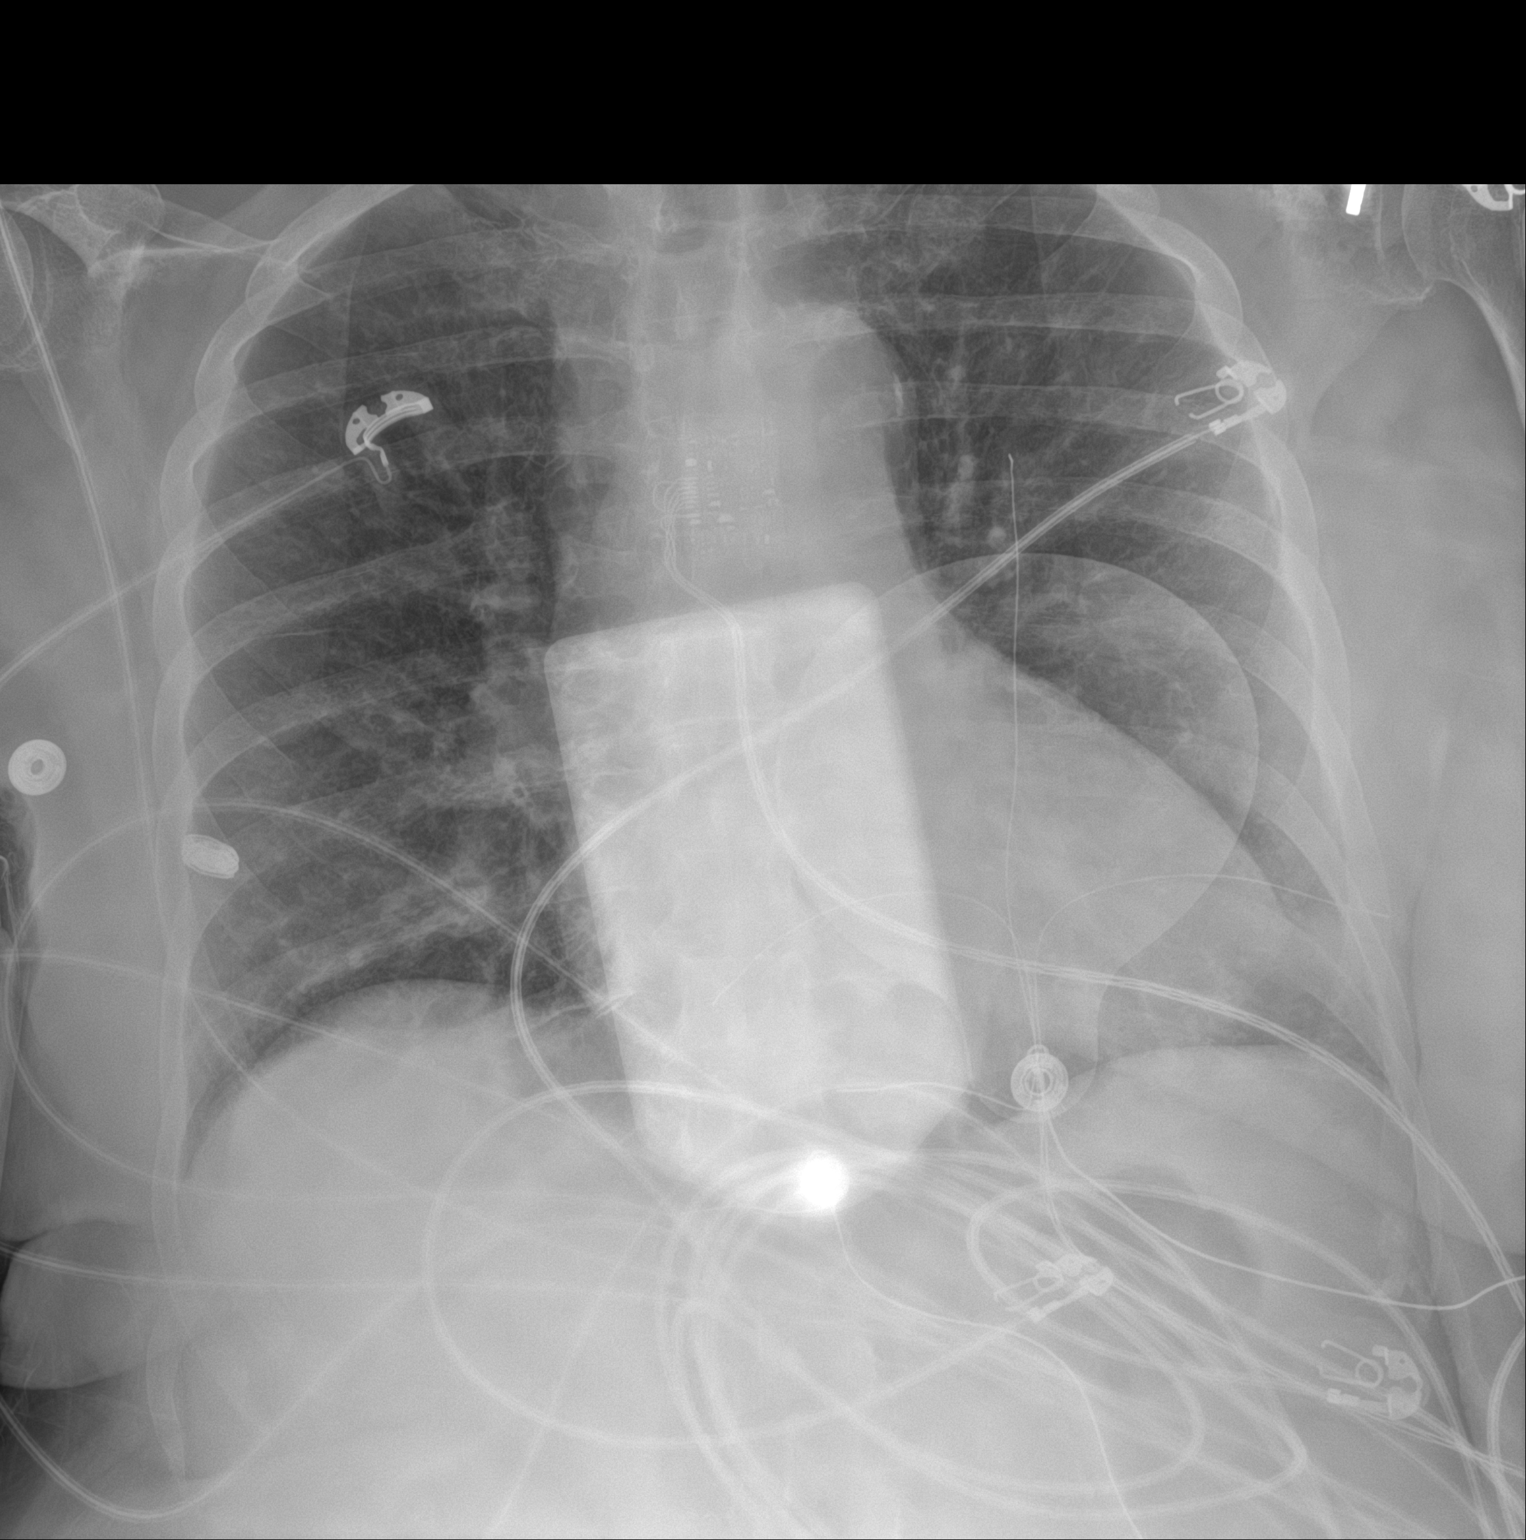

[1 of 1 positions shown; findings below may reference images not displayed]

FINDINGS: Portable AP semi upright view at 8782 hours. Pacer or resuscitation
pads project over the chest. Cardiac and mediastinal contours are
stable and within normal limits. Stable lung volumes. Visualized
tracheal air column is within normal limits. Allowing for portable
technique the lungs are clear. No pneumothorax or pleural effusion.
No acute osseous abnormality identified. Negative visible bowel gas
pattern.
IMPRESSION: No acute cardiopulmonary abnormality.

## 2023-07-01 IMAGING — DX DG CHEST 2V
2 series · 2 of 2 positions shown · non-contrast
Comparison: One-view chest x-ray 02/18/2021

CLINICAL DATA: There is pacemaker placement.

EXAM:
CHEST - 2 VIEW

[chest lat]
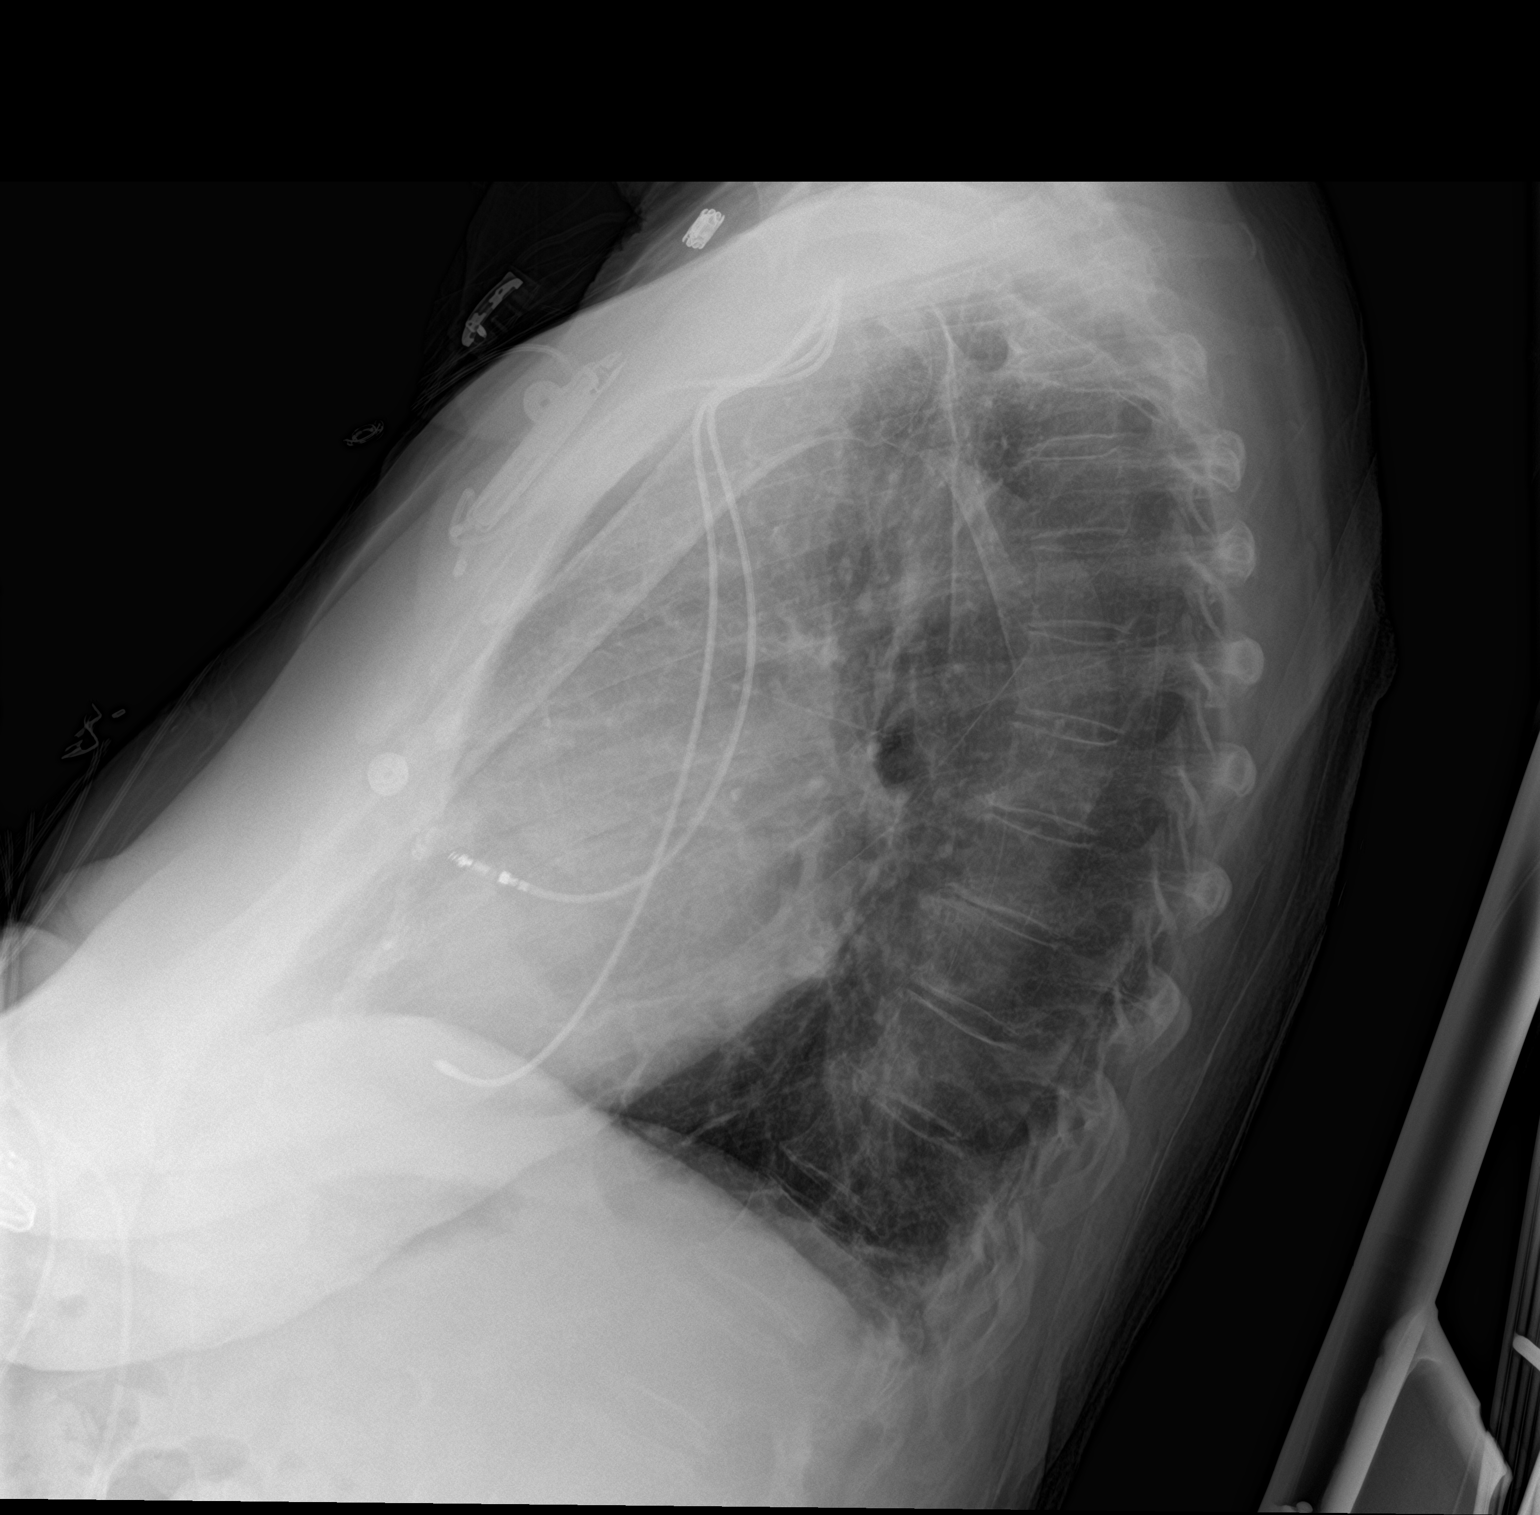

[chest ap]
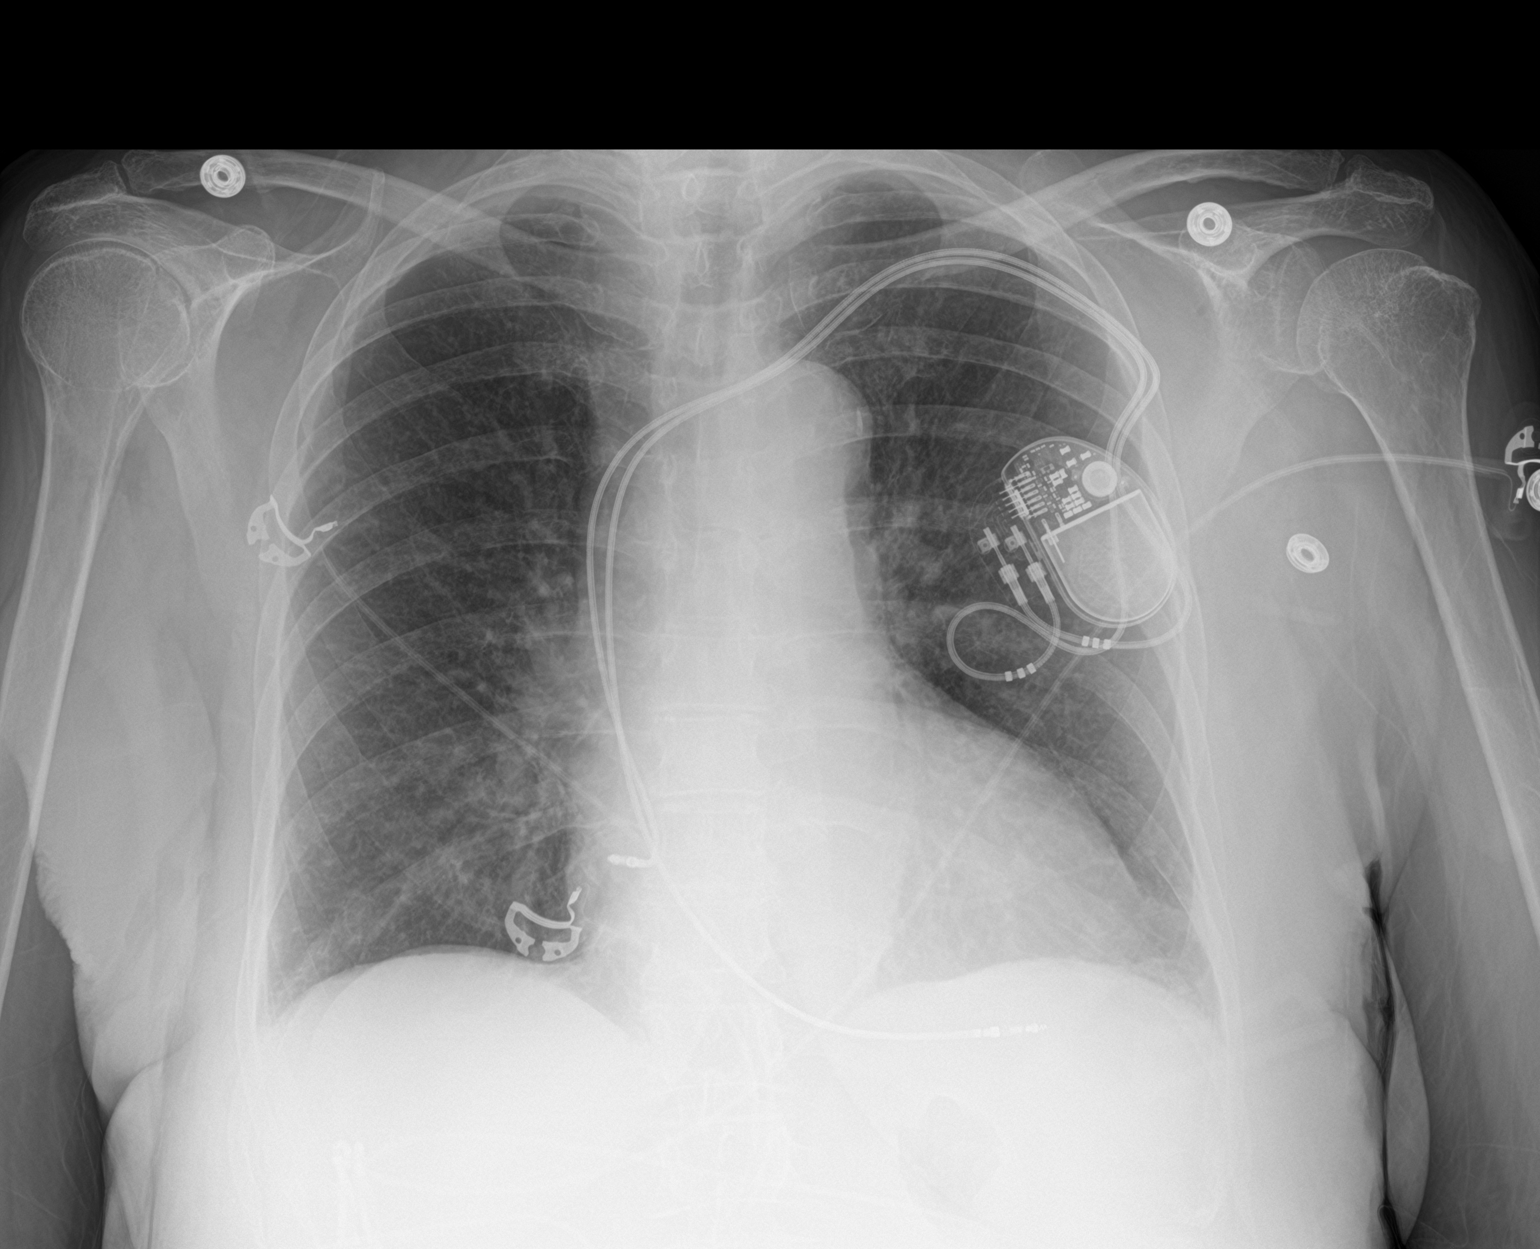

[2 of 2 positions shown; findings below may reference images not displayed]

FINDINGS: Heart is mildly enlarged. Mild pulmonary vascular congestion is
present. Atherosclerotic changes are again noted at the aortic arch.

Dual-chamber pacemaker in place. Leads terminate in the right atrium
and right ventricle. No complications. Generator positioned over the
left chest. Axial skeleton within normal limits.
IMPRESSION: Interval placement of dual-chamber pacemaker without radiographic
evidence for complication.

## 2023-09-12 IMAGING — CR DG CHEST 2V
2 series · 2 of 2 positions shown · non-contrast
Comparison: March 24, 2021

CLINICAL DATA: Chest pain.

EXAM:
CHEST - 2 VIEW

[chest lat]
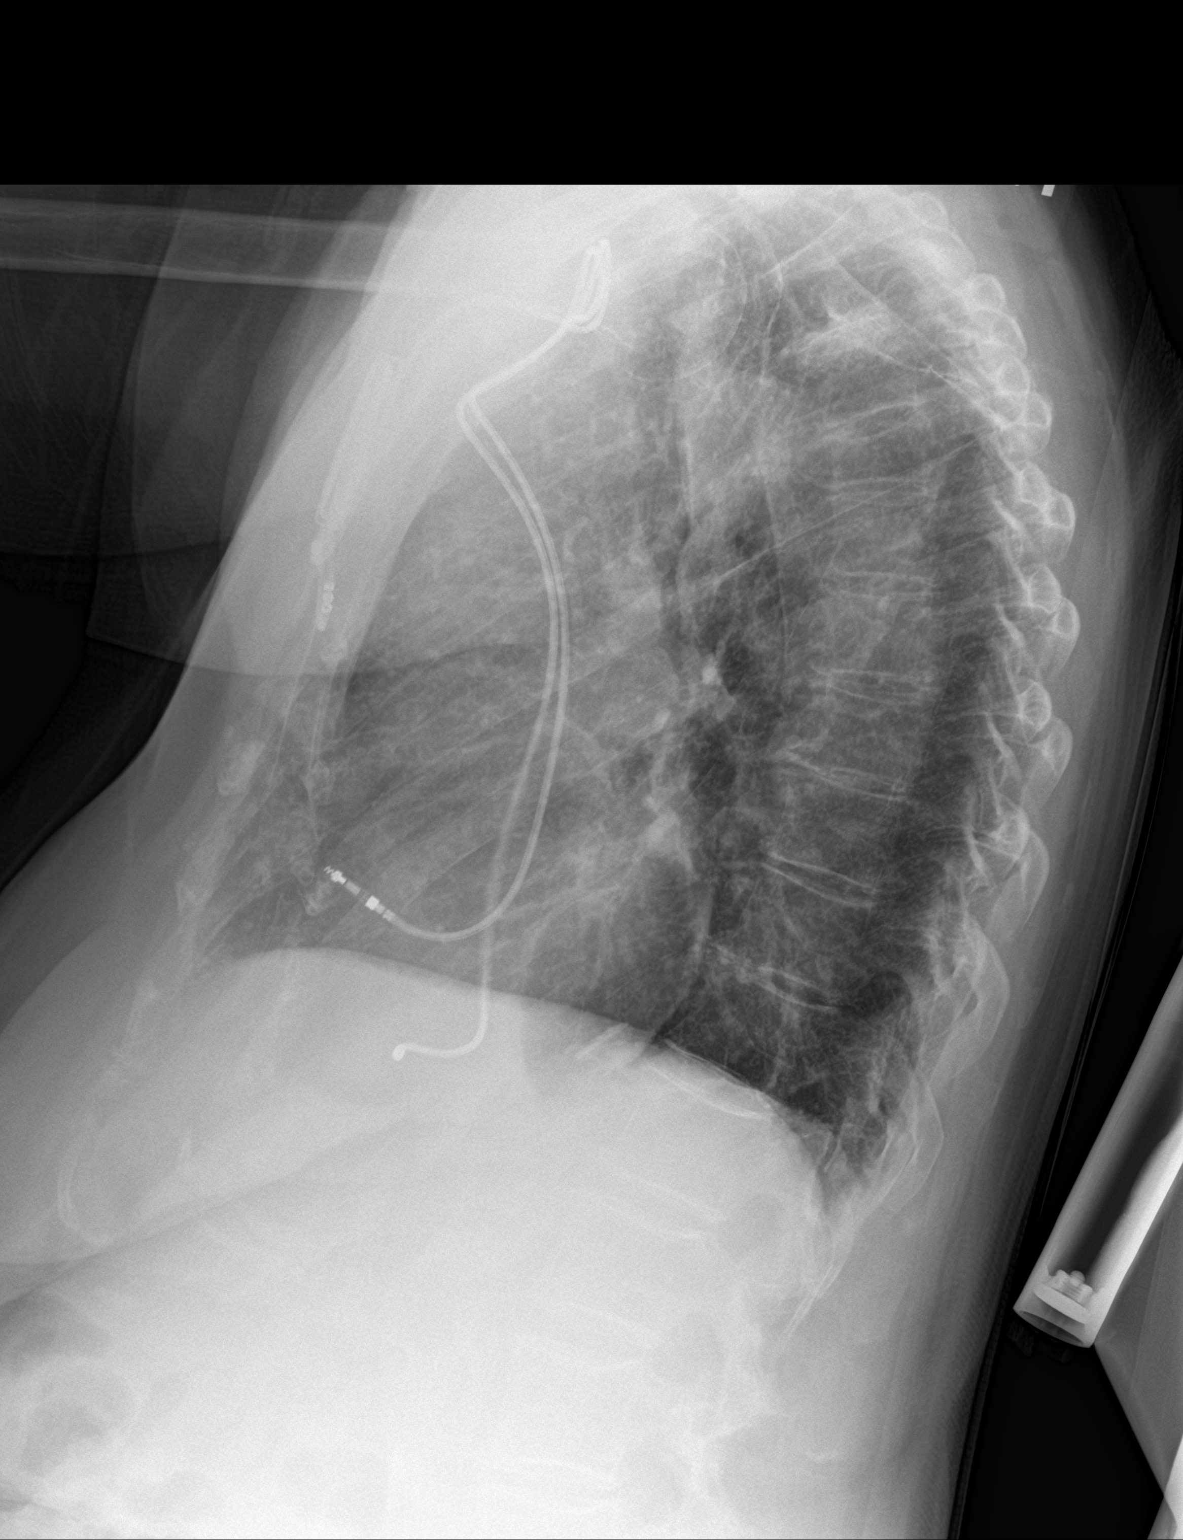

[chest ap]
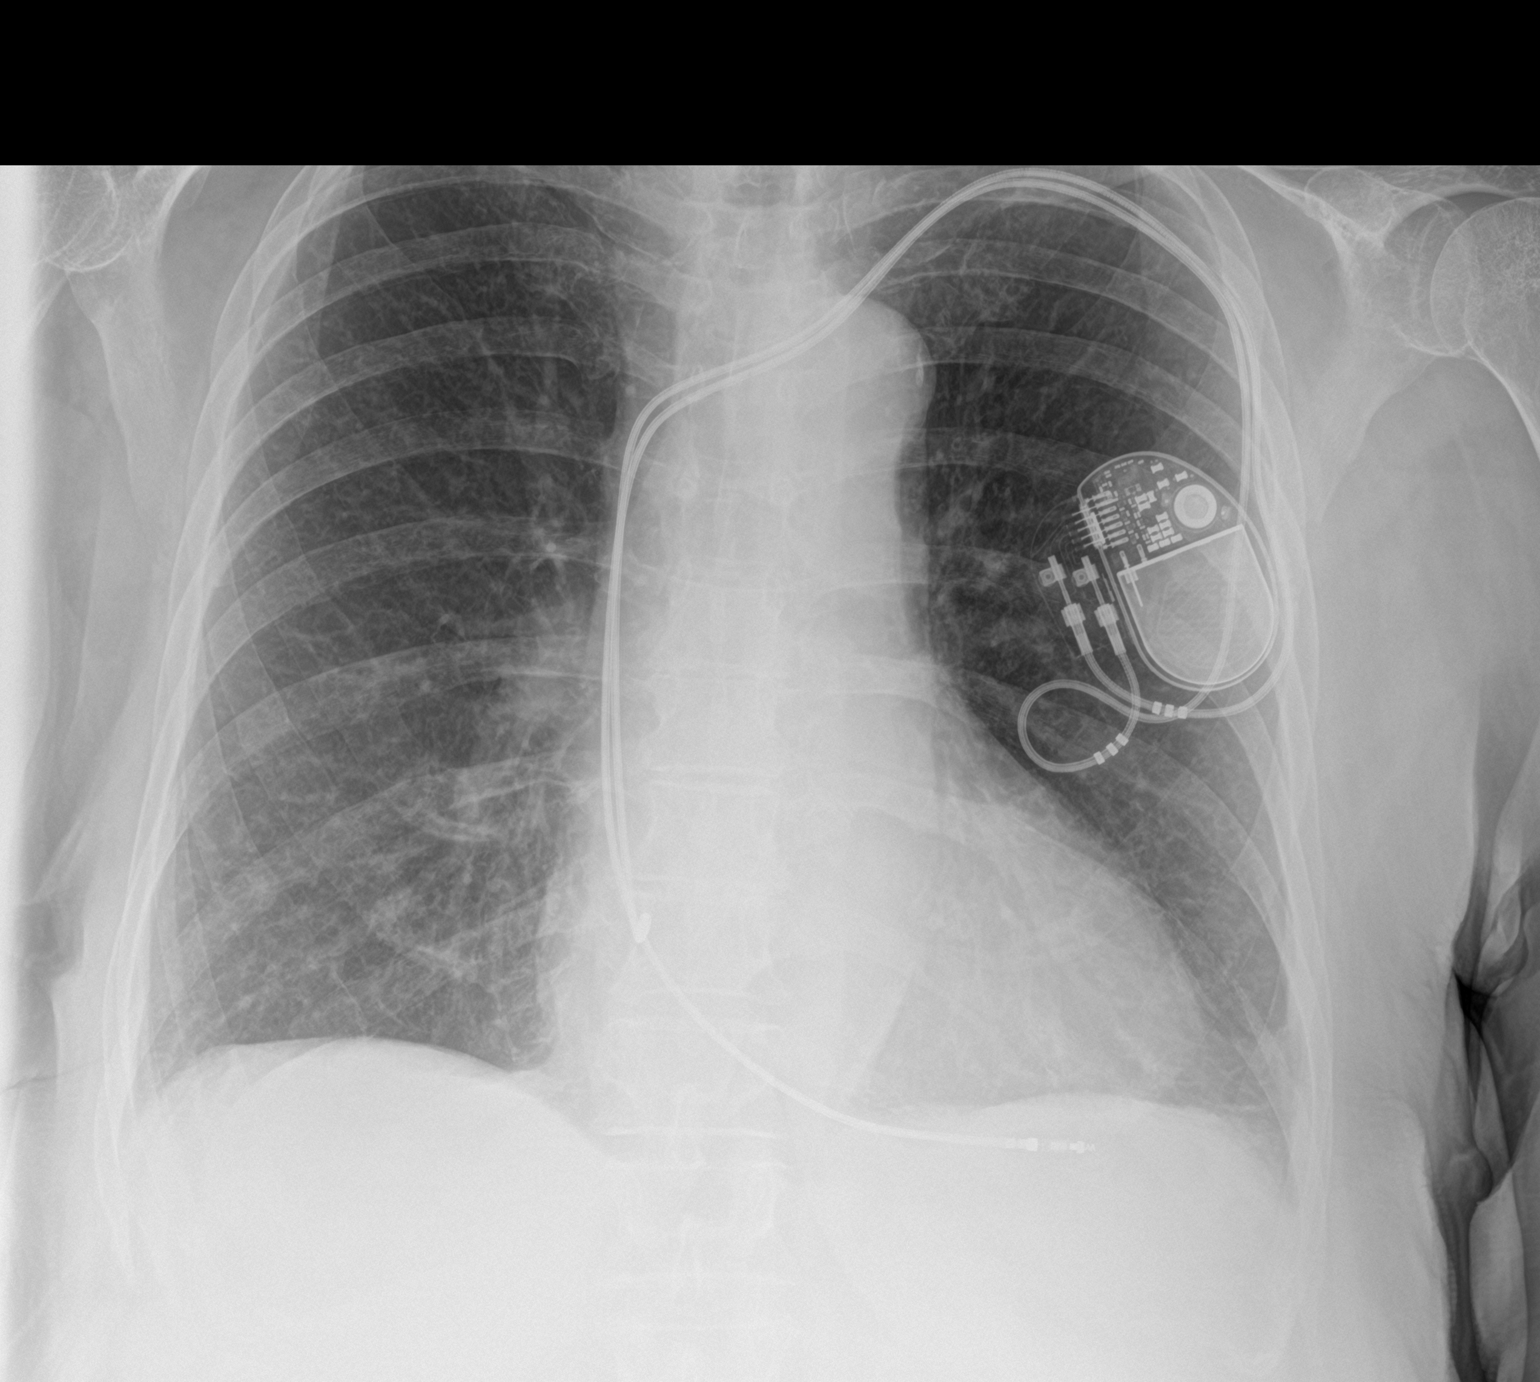

[2 of 2 positions shown; findings below may reference images not displayed]

FINDINGS: Cardiac pacemaker in stable position.

Cardiomediastinal silhouette is normal. Mediastinal contours appear
intact.

There is no evidence of focal airspace consolidation, pleural
effusion or pneumothorax. Minimal atelectasis versus scarring in the
lung bases.

Osseous structures are without acute abnormality. Soft tissues are
grossly normal.
IMPRESSION: No active cardiopulmonary disease.
